# Patient Record
Sex: Female | Born: 1950 | Race: White | Hispanic: No | Smoking: Never smoker
Health system: Southern US, Community
[De-identification: ages and names within clinical notes are randomized; demographics above are authoritative.]

## PROBLEM LIST (undated history)

## (undated) DIAGNOSIS — I10 Essential (primary) hypertension: Secondary | ICD-10-CM

## (undated) DIAGNOSIS — T7840XA Allergy, unspecified, initial encounter: Secondary | ICD-10-CM

## (undated) DIAGNOSIS — E785 Hyperlipidemia, unspecified: Secondary | ICD-10-CM

## (undated) DIAGNOSIS — K219 Gastro-esophageal reflux disease without esophagitis: Secondary | ICD-10-CM

## (undated) DIAGNOSIS — E079 Disorder of thyroid, unspecified: Secondary | ICD-10-CM

## (undated) DIAGNOSIS — Z9889 Other specified postprocedural states: Secondary | ICD-10-CM

## (undated) DIAGNOSIS — R112 Nausea with vomiting, unspecified: Secondary | ICD-10-CM

## (undated) DIAGNOSIS — H269 Unspecified cataract: Secondary | ICD-10-CM

## (undated) HISTORY — PX: EYE SURGERY: SHX253

## (undated) HISTORY — DX: Gastro-esophageal reflux disease without esophagitis: K21.9

## (undated) HISTORY — DX: Allergy, unspecified, initial encounter: T78.40XA

## (undated) HISTORY — PX: LIGAMENT REPAIR: SHX5444

## (undated) HISTORY — DX: Hyperlipidemia, unspecified: E78.5

## (undated) HISTORY — PX: TUBAL LIGATION: SHX77

## (undated) HISTORY — DX: Unspecified cataract: H26.9

## (undated) HISTORY — DX: Essential (primary) hypertension: I10

## (undated) HISTORY — DX: Disorder of thyroid, unspecified: E07.9

## (undated) HISTORY — PX: FRACTURE SURGERY: SHX138

---

## 1997-10-29 ENCOUNTER — Other Ambulatory Visit: Admission: RE | Admit: 1997-10-29 | Discharge: 1997-10-29 | Payer: Self-pay | Admitting: Obstetrics and Gynecology

## 1998-08-13 ENCOUNTER — Other Ambulatory Visit: Admission: RE | Admit: 1998-08-13 | Discharge: 1998-08-13 | Payer: Self-pay | Admitting: Obstetrics and Gynecology

## 1999-10-22 ENCOUNTER — Other Ambulatory Visit: Admission: RE | Admit: 1999-10-22 | Discharge: 1999-10-22 | Payer: Self-pay | Admitting: Obstetrics and Gynecology

## 2000-10-22 ENCOUNTER — Other Ambulatory Visit: Admission: RE | Admit: 2000-10-22 | Discharge: 2000-10-22 | Payer: Self-pay | Admitting: Obstetrics and Gynecology

## 2001-08-10 HISTORY — PX: ESOPHAGOGASTRODUODENOSCOPY: SHX1529

## 2002-03-16 ENCOUNTER — Encounter: Admission: RE | Admit: 2002-03-16 | Discharge: 2002-03-16 | Payer: Self-pay | Admitting: Internal Medicine

## 2002-03-16 ENCOUNTER — Encounter: Payer: Self-pay | Admitting: Internal Medicine

## 2002-05-16 ENCOUNTER — Ambulatory Visit (HOSPITAL_COMMUNITY): Admission: RE | Admit: 2002-05-16 | Discharge: 2002-05-16 | Payer: Self-pay | Admitting: Gastroenterology

## 2002-06-21 ENCOUNTER — Encounter: Admission: RE | Admit: 2002-06-21 | Discharge: 2002-06-21 | Payer: Self-pay | Admitting: Internal Medicine

## 2002-06-21 ENCOUNTER — Encounter: Payer: Self-pay | Admitting: Internal Medicine

## 2002-07-07 ENCOUNTER — Emergency Department (HOSPITAL_COMMUNITY): Admission: EM | Admit: 2002-07-07 | Discharge: 2002-07-07 | Payer: Self-pay | Admitting: *Deleted

## 2002-07-12 ENCOUNTER — Encounter (HOSPITAL_COMMUNITY): Admission: RE | Admit: 2002-07-12 | Discharge: 2002-10-10 | Payer: Self-pay | Admitting: Internal Medicine

## 2002-07-13 ENCOUNTER — Encounter: Payer: Self-pay | Admitting: Internal Medicine

## 2002-08-10 HISTORY — PX: ESOPHAGEAL MANOMETRY: SHX1526

## 2002-08-10 HISTORY — PX: OTHER SURGICAL HISTORY: SHX169

## 2002-09-06 ENCOUNTER — Encounter: Admission: RE | Admit: 2002-09-06 | Discharge: 2002-09-06 | Payer: Self-pay | Admitting: Surgery

## 2002-09-06 ENCOUNTER — Encounter: Payer: Self-pay | Admitting: Surgery

## 2003-02-20 ENCOUNTER — Ambulatory Visit (HOSPITAL_COMMUNITY): Admission: RE | Admit: 2003-02-20 | Discharge: 2003-02-20 | Payer: Self-pay | Admitting: Gastroenterology

## 2003-02-21 ENCOUNTER — Encounter: Admission: RE | Admit: 2003-02-21 | Discharge: 2003-02-21 | Payer: Self-pay | Admitting: Endocrinology

## 2003-02-21 ENCOUNTER — Encounter: Payer: Self-pay | Admitting: Endocrinology

## 2003-03-05 ENCOUNTER — Encounter: Payer: Self-pay | Admitting: Orthopedic Surgery

## 2003-03-05 ENCOUNTER — Ambulatory Visit: Admission: RE | Admit: 2003-03-05 | Discharge: 2003-03-05 | Payer: Self-pay | Admitting: Orthopedic Surgery

## 2003-04-09 ENCOUNTER — Encounter (HOSPITAL_COMMUNITY): Admission: RE | Admit: 2003-04-09 | Discharge: 2003-05-09 | Payer: Self-pay | Admitting: Orthopedic Surgery

## 2003-09-04 ENCOUNTER — Encounter: Admission: RE | Admit: 2003-09-04 | Discharge: 2003-09-04 | Payer: Self-pay | Admitting: Endocrinology

## 2003-12-27 ENCOUNTER — Encounter: Admission: RE | Admit: 2003-12-27 | Discharge: 2003-12-27 | Payer: Self-pay | Admitting: Endocrinology

## 2004-03-25 ENCOUNTER — Other Ambulatory Visit: Admission: RE | Admit: 2004-03-25 | Discharge: 2004-03-25 | Payer: Self-pay | Admitting: Internal Medicine

## 2005-02-13 ENCOUNTER — Encounter: Admission: RE | Admit: 2005-02-13 | Discharge: 2005-02-13 | Payer: Self-pay | Admitting: Endocrinology

## 2005-06-02 ENCOUNTER — Other Ambulatory Visit: Admission: RE | Admit: 2005-06-02 | Discharge: 2005-06-02 | Payer: Self-pay | Admitting: Obstetrics and Gynecology

## 2006-08-18 ENCOUNTER — Encounter: Admission: RE | Admit: 2006-08-18 | Discharge: 2006-08-18 | Payer: Self-pay | Admitting: Endocrinology

## 2007-08-16 ENCOUNTER — Encounter: Admission: RE | Admit: 2007-08-16 | Discharge: 2007-08-16 | Payer: Self-pay | Admitting: Internal Medicine

## 2007-09-21 ENCOUNTER — Encounter: Admission: RE | Admit: 2007-09-21 | Discharge: 2007-09-21 | Payer: Self-pay | Admitting: Internal Medicine

## 2008-08-21 ENCOUNTER — Encounter: Admission: RE | Admit: 2008-08-21 | Discharge: 2008-08-21 | Payer: Self-pay | Admitting: Endocrinology

## 2010-08-31 ENCOUNTER — Encounter: Payer: Self-pay | Admitting: Endocrinology

## 2010-08-31 ENCOUNTER — Encounter: Payer: Self-pay | Admitting: Rheumatology

## 2010-08-31 ENCOUNTER — Encounter: Payer: Self-pay | Admitting: Internal Medicine

## 2010-12-26 NOTE — Op Note (Signed)
   NAME:  [name], KIDNEY NO.:  [id number]   MEDICAL RECORD NO.:  [id number]                   PATIENT TYPE:  AMB   LOCATION:  ENDO                                 FACILITY:  [facility]   PHYSICIAN:  Danise Edge, M.D.                DATE OF BIRTH:  03-Jan-1951   DATE OF PROCEDURE:  02/20/2003  DATE OF DISCHARGE:  02/20/2003                                 OPERATIVE REPORT   PROCEDURE:  Esophageal manometry, ambulatory 24 hour esophageal pH study.   PROCEDURE INDICATIONS:  Ms. Jill Roach is a 60 year old female born September 05, 1951.  Ms. Jill Roach takes Nexium twice daily for presumed gastroesophageal  reflux with regurgitation.  She took her last dose of Nexium February 12, 2003,  and is undergoing her procedure February 20, 2003.   ESOPHAGEAL MANOMETRY FINDINGS:  1. Lower esophageal sphincter data:  The resting lower esophageal sphincter     pressure is 33.4 mmHg which is normal.  There is 98% relaxation of the     lower esophageal sphincter for a duration of 17.4 seconds with swallow.  2. Esophageal body data:  Ms. Jill Roach performed ten wet swallows to obtain     esophageal body function data.  90% of her wet swallows were peristaltic     and 10% of her wet swallows resulted in retrograde contraction of the     esophagus.  Amplitude of the peristaltic contractions averages 47 mmHg     which is normal.  3. Upper esophageal sphincter function is coordinated.   IMPRESSION:  Normal esophageal manometry.   24 hour ambulatory esophageal pH study:  The composite Johnson/DeMeester  score analysis is 10.6 which is well within the normal range indicating  normal amount of gastroesophageal reflux.  Upright time in reflux measured  at 5.2% which is normal.  Recumbent time in reflux measured at 0.1% which is  normal.  Total time in reflux 3.3% which is normal.  Episodes of reflux  lasting over 5 minutes 0.  Longest episode of reflux 3.8 minutes which is  normal.  Total episodes  of reflux in 24 hours 60 with normal being less than  50.                                               Danise Edge, M.D.    MJ/MEDQ  D:  02/28/2003  T:  02/28/2003  Job:  [id number]

## 2010-12-26 NOTE — Op Note (Signed)
   NAME:  Jill Roach, Jill Roach NO.:  [id number]   MEDICAL RECORD NO.:  [id number]                   PATIENT TYPE:  AMB   LOCATION:  ENDO                                 FACILITY:  [facility]   PHYSICIAN:  Danise Edge, M.D.                DATE OF BIRTH:  Jan 31, 1951   DATE OF PROCEDURE:  05/16/2002  DATE OF DISCHARGE:                                 OPERATIVE REPORT   PROCEDURE PERFORMED:  Esophagogastroduodenoscopy.   ENDOSCOPIST:  Charolett Bumpers, M.D.   INDICATIONS FOR PROCEDURE:  The patient is a 60 year old  female born  09/21/1950.  The patient has a globus sensation in her hypopharynx.  Her barium tablet, liquid barium swallow and upper GI x-ray series was  normal.  Her ENT exam was normal.  She denies heartburn.  An empiric trial  of Nexium has not improved her globus sensation.   ENDOSCOPIST:  Charolett Bumpers, M.D.   PREMEDICATION:  Versed 10 mg, Fentanyl 50 mcg.   INSTRUMENT USED:  Olympus gastroscope.   DESCRIPTION OF PROCEDURE:  After obtaining informed consent, the patient was  placed in the left lateral decubitus position.  I administered intravenous  fentanyl and intravenous Versed to achieve conscious sedation for the  procedure.  The patient's blood pressure, oxygen saturations and cardiac  rhythm were monitored throughout the procedure and documented in the medical  record.   The Olympus gastroscope was passed through the posterior hypopharynx and the  proximal esophagus without difficulty.  The hypopharynx, larynx and vocal  cords appeared normal.   Esophagoscopy:  The proximal, mid and lower segments of the esophagus appear  normal.   Gastroscopy:  Retroflex view of the gastric cardia and fundus was normal.  The gastric body, antrum and pylorus appeared normal.   Duodenoscopy:  The duodenal bulb and descending duodenum appeared normal.   ASSESSMENT:  Normal esophagogastroduodenoscopy.                         Danise Edge, M.D.    MJ/MEDQ  D:  05/16/2002  T:  05/16/2002  Job:  [id number]   cc:   Lilla Shook, M.D.  [address], [address]  [place]  [place]  [address]  Fax: [phone]

## 2013-02-21 ENCOUNTER — Other Ambulatory Visit (HOSPITAL_COMMUNITY): Payer: Self-pay | Admitting: Physician Assistant

## 2013-02-21 DIAGNOSIS — Z139 Encounter for screening, unspecified: Secondary | ICD-10-CM

## 2013-02-27 ENCOUNTER — Ambulatory Visit (HOSPITAL_COMMUNITY)
Admission: RE | Admit: 2013-02-27 | Discharge: 2013-02-27 | Disposition: A | Discharge status: Home / Self Care | Payer: Self-pay | Source: Ambulatory Visit | Attending: Physician Assistant | Admitting: Physician Assistant

## 2013-02-27 DIAGNOSIS — Z139 Encounter for screening, unspecified: Secondary | ICD-10-CM

## 2014-03-06 ENCOUNTER — Other Ambulatory Visit (HOSPITAL_COMMUNITY): Payer: Self-pay | Admitting: Physician Assistant

## 2014-03-06 DIAGNOSIS — Z1231 Encounter for screening mammogram for malignant neoplasm of breast: Secondary | ICD-10-CM

## 2014-06-21 ENCOUNTER — Other Ambulatory Visit (HOSPITAL_COMMUNITY): Payer: Self-pay | Admitting: Physician Assistant

## 2014-06-21 DIAGNOSIS — Z1231 Encounter for screening mammogram for malignant neoplasm of breast: Secondary | ICD-10-CM

## 2014-06-25 ENCOUNTER — Ambulatory Visit (HOSPITAL_COMMUNITY)
Admission: RE | Admit: 2014-06-25 | Discharge: 2014-06-25 | Disposition: A | Discharge status: Home / Self Care | Payer: Self-pay | Source: Ambulatory Visit | Attending: Physician Assistant | Admitting: Physician Assistant

## 2014-06-25 DIAGNOSIS — Z1231 Encounter for screening mammogram for malignant neoplasm of breast: Secondary | ICD-10-CM

## 2015-06-05 ENCOUNTER — Ambulatory Visit: Payer: Self-pay | Admitting: Physician Assistant

## 2015-07-24 ENCOUNTER — Other Ambulatory Visit: Payer: Self-pay | Admitting: Physician Assistant

## 2015-09-20 ENCOUNTER — Encounter: Payer: Self-pay | Admitting: Family Medicine

## 2015-09-20 DIAGNOSIS — E785 Hyperlipidemia, unspecified: Secondary | ICD-10-CM | POA: Insufficient documentation

## 2015-09-20 DIAGNOSIS — I10 Essential (primary) hypertension: Secondary | ICD-10-CM | POA: Insufficient documentation

## 2015-09-20 DIAGNOSIS — E039 Hypothyroidism, unspecified: Secondary | ICD-10-CM | POA: Insufficient documentation

## 2015-09-24 ENCOUNTER — Ambulatory Visit: Payer: Self-pay | Admitting: Family Medicine

## 2015-09-24 ENCOUNTER — Telehealth: Payer: Self-pay | Admitting: Family Medicine

## 2015-09-24 NOTE — Telephone Encounter (Signed)
Left VM asking pt to return my call on cell home number rings once and then goes to a busy signal.

## 2015-09-24 NOTE — Telephone Encounter (Signed)
I can not legally fill any meds until she establishes here, see if she can come in tomorrow

## 2015-09-24 NOTE — Telephone Encounter (Signed)
Patient is coming in tomorrow at 3:00.

## 2015-09-24 NOTE — Telephone Encounter (Signed)
Patient showed up at 12:50 for a 1:15 appointment today. She misunderstood when we gave her the appointment for today at 12:15. She is rescheduled for 10/07/15 she is about out of her synthroid. I advised her I would send a message asking since she hasn't been seen I wasn't sure if we could fill the medication.

## 2015-09-25 ENCOUNTER — Ambulatory Visit (INDEPENDENT_AMBULATORY_CARE_PROVIDER_SITE_OTHER): Payer: PPO | Admitting: Family Medicine

## 2015-09-25 ENCOUNTER — Encounter: Payer: Self-pay | Admitting: Family Medicine

## 2015-09-25 VITALS — BP 132/74 | HR 72 | Temp 98.4°F | Resp 16 | Ht 63.0 in | Wt 175.0 lb

## 2015-09-25 DIAGNOSIS — Z0000 Encounter for general adult medical examination without abnormal findings: Secondary | ICD-10-CM

## 2015-09-25 DIAGNOSIS — Z23 Encounter for immunization: Secondary | ICD-10-CM

## 2015-09-25 DIAGNOSIS — Z13820 Encounter for screening for osteoporosis: Secondary | ICD-10-CM

## 2015-09-25 DIAGNOSIS — M1990 Unspecified osteoarthritis, unspecified site: Secondary | ICD-10-CM | POA: Diagnosis not present

## 2015-09-25 DIAGNOSIS — E038 Other specified hypothyroidism: Secondary | ICD-10-CM

## 2015-09-25 DIAGNOSIS — Z1239 Encounter for other screening for malignant neoplasm of breast: Secondary | ICD-10-CM | POA: Diagnosis not present

## 2015-09-25 DIAGNOSIS — I10 Essential (primary) hypertension: Secondary | ICD-10-CM | POA: Diagnosis not present

## 2015-09-25 DIAGNOSIS — E785 Hyperlipidemia, unspecified: Secondary | ICD-10-CM

## 2015-09-25 MED ORDER — LISINOPRIL-HYDROCHLOROTHIAZIDE 20-12.5 MG PO TABS: 1.0000 | ORAL_TABLET | Freq: Every day | ORAL | Status: DC

## 2015-09-25 MED ORDER — LEVOTHYROXINE SODIUM 88 MCG PO TABS: 88.0000 ug | ORAL_TABLET | Freq: Every day | ORAL | Status: DC

## 2015-09-25 MED ORDER — ZOSTER VACCINE LIVE 19400 UNT/0.65ML ~~LOC~~ SOLR: 0.6500 mL | Freq: Once | SUBCUTANEOUS | Status: DC

## 2015-09-25 NOTE — Patient Instructions (Signed)
Mammogram - call for appt Make appt for Bone Density  Release of records- [facility] in [place]  We will call with lab results  Prevnar 13 given  Colonoscopy referral  F/U 6 months

## 2015-09-25 NOTE — Assessment & Plan Note (Signed)
Continue current dose of thyroid medication. I will recheck levels today  She will need an ultrasound done in the near future

## 2015-09-25 NOTE — Assessment & Plan Note (Addendum)
Past nodules noted on her hands already. She has family history of rheumatoid. Check a rheumatoid factor  At next visit

## 2015-09-25 NOTE — Assessment & Plan Note (Signed)
Pressures well controlled she occasionally gets a dry cough but she does not want to change the medication it is not bothersome

## 2015-09-25 NOTE — Progress Notes (Signed)
Patient ID: Jill Roach, female   DOB: June 17, 1951, 65 y.o.   MRN: [id number] Subjective:   Patient presents for Medicare Annual/Subsequent preventive examination.  Pthere for welcome to Medicare examination also to establish care. She was being followed by the free clinic in [facility].  her past medical history was reviewed. She has history of goiter she also had a small nodule on her thyroid she's been on thyroid medication for the past 5 years or more. Her dose has been stable for many years.   She is history of hypertension diagnosed greater than 20 years ago she occasionally gets a hacking cough with her medicine but is not bothersome to her. She also has mild hyperlipidemia which she treats with diet and fish oil.    she was divorced 3 years ago she's been separated for 4 years. She is still going through legal proceedings over property and finances. She had to move in with her son for the past 2 years because her husband foreclosed on their home. She has never worked so this is been very difficult for her. She was treated with medication in the past she also went through divorce counseling she's not been on medications for greater than a year and feels that she is doing well.   she does have arthritis and gets some nodules on her fingers. She has a strong family history of rheumatoid arthritis. She does not take anything for this. Review Past Medical/Family/Social: Per EMR   Risk Factors  Current exercise habits: walks Dietary issues discussed: Yes   Cardiac risk factors: HTN    Depression Screen  (Note: if answer to either of the following is "Yes", a more complete depression screening is indicated)  Over the past two weeks, have you felt down, depressed or hopeless? No Over the past two weeks, have you felt little interest or pleasure in doing things? No Have you lost interest or pleasure in daily life? No Do you often feel hopeless? No Do you cry easily over  simple problems? No   Activities of Daily Living  In your present state of health, do you have any difficulty performing the following activities?:  Driving? No  Managing money? No  Feeding yourself? No  Getting from bed to chair? No  Climbing a flight of stairs? No  Preparing food and eating?: No  Bathing or showering? No  Getting dressed: No  Getting to the toilet? No  Using the toilet:No  Moving around from place to place: No  In the past year have you fallen or had a near fall?:No  Are you sexually active? No  Do you have more than one partner? No   Hearing Difficulties: No  Do you often ask people to speak up or repeat themselves? No  Do you experience ringing or noises in your ears? No Do you have difficulty understanding soft or whispered voices? No  Do you feel that you have a problem with memory? No Do you often misplace items? No  Do you feel safe at home? Yes  Cognitive Testing  Alert? Yes Normal Appearance?Yes  Oriented to person? Yes Place? Yes  Time? Yes  Recall of three objects? Yes  Can perform simple calculations? Yes  Displays appropriate judgment?Yes  Can read the correct time from a watch face?Yes   List the Names of Other Physician/Practitioners you currently use: None   ROS: GEN- denies fatigue, fever, weight loss,weakness, recent illness HEENT- denies eye drainage, change in vision, nasal discharge,  CVS- denies chest pain, palpitations RESP- denies SOB, cough, wheeze ABD- denies N/V, change in stools, abd pain GU- denies dysuria, hematuria, dribbling, incontinence MSK- denies joint pain, muscle aches, injury Neuro- denies headache, dizziness, syncope, seizure activity  Physical: GEN- NAD, alert and oriented x3 HEENT- PERRL, EOMI, non injected sclera, pink conjunctiva, MMM, oropharynx clear Neck- Supple, no thryomegaly, no bruit  CVS- RRR, no murmur RESP-CTAB ABD-NABS,soft,NT,ND Psych- normal affect and mood  EXT- No edema Pulses- Radial,  DP- 2+   Screening Tests / Date Colonoscopy        Overdue              Zostavax - Due Mammogram -Due  Influenza Vaccine UTD Tetanus/tdap UTD  Pneumonia- Due    Assessment:    Annual wellness medicare exam   Plan:    During the course of the visit the patient was educated and counseled about appropriate screening and preventive services including:  Screening mammography  Colorectal cancer screening  Shingles vaccine. Prescription given to that she can get the vaccine at the pharmacy or Medicare part D.  Screen NEG for depression.   pNEUMONIA VACCINE GIVEN  Diet review for nutrition referral? Yes ____ Not Indicated __x__  Patient Instructions (the written plan) was given to the patient.  Medicare Attestation  I have personally reviewed:  The patient's medical and social history  Their use of alcohol, tobacco or illicit drugs  Their current medications and supplements  The patient's functional ability including ADLs,fall risks, home safety risks, cognitive, and hearing and visual impairment  Diet and physical activities  Evidence for depression or mood disorders  The patient's weight, height, BMI, and visual acuity have been recorded in the chart. I have made referrals, counseling, and provided education to the patient based on review of the above and I have provided the patient with a written personalized care plan for preventive services.

## 2015-09-25 NOTE — Assessment & Plan Note (Signed)
I will obtain her fasting labs and records from her previous provider

## 2015-09-26 LAB — CBC WITH DIFFERENTIAL/PLATELET
Basophils Absolute: 0.1 10*3/uL (ref 0.0–0.1)
Basophils Relative: 1 % (ref 0–1)
Eosinophils Absolute: 0.1 10*3/uL (ref 0.0–0.7)
Eosinophils Relative: 1 % (ref 0–5)
HCT: 39.4 % (ref 36.0–46.0)
Hemoglobin: 13.2 g/dL (ref 12.0–15.0)
Lymphocytes Relative: 33 % (ref 12–46)
Lymphs Abs: 2.4 10*3/uL (ref 0.7–4.0)
MCH: 28 pg (ref 26.0–34.0)
MCHC: 33.5 g/dL (ref 30.0–36.0)
MCV: 83.7 fL (ref 78.0–100.0)
MPV: 9.9 fL (ref 8.6–12.4)
Monocytes Absolute: 0.5 10*3/uL (ref 0.1–1.0)
Monocytes Relative: 7 % (ref 3–12)
Neutro Abs: 4.2 10*3/uL (ref 1.7–7.7)
Neutrophils Relative %: 58 % (ref 43–77)
Platelets: 272 10*3/uL (ref 150–400)
RBC: 4.71 MIL/uL (ref 3.87–5.11)
RDW: 14.7 % (ref 11.5–15.5)
WBC: 7.2 10*3/uL (ref 4.0–10.5)

## 2015-09-26 LAB — COMPREHENSIVE METABOLIC PANEL
ALT: 13 U/L (ref 6–29)
AST: 14 U/L (ref 10–35)
Albumin: 4.3 g/dL (ref 3.6–5.1)
Alkaline Phosphatase: 67 U/L (ref 33–130)
BUN: 15 mg/dL (ref 7–25)
CO2: 25 mmol/L (ref 20–31)
Calcium: 9.1 mg/dL (ref 8.6–10.4)
Chloride: 104 mmol/L (ref 98–110)
Creat: 0.76 mg/dL (ref 0.50–0.99)
Glucose, Bld: 92 mg/dL (ref 70–99)
Potassium: 3.8 mmol/L (ref 3.5–5.3)
Sodium: 139 mmol/L (ref 135–146)
Total Bilirubin: 0.5 mg/dL (ref 0.2–1.2)
Total Protein: 6.9 g/dL (ref 6.1–8.1)

## 2015-09-26 LAB — TSH: TSH: 1.01 mIU/L

## 2015-09-26 LAB — T3, FREE: T3, Free: 2.8 pg/mL (ref 2.3–4.2)

## 2015-09-26 LAB — T4, FREE: Free T4: 1.2 ng/dL (ref 0.8–1.8)

## 2015-09-27 ENCOUNTER — Other Ambulatory Visit: Payer: Self-pay | Admitting: Family Medicine

## 2015-09-27 DIAGNOSIS — Z1211 Encounter for screening for malignant neoplasm of colon: Secondary | ICD-10-CM

## 2015-10-03 ENCOUNTER — Encounter (INDEPENDENT_AMBULATORY_CARE_PROVIDER_SITE_OTHER): Payer: Self-pay | Admitting: *Deleted

## 2015-10-07 ENCOUNTER — Ambulatory Visit: Payer: PPO | Admitting: Family Medicine

## 2015-10-15 ENCOUNTER — Encounter: Payer: Self-pay | Admitting: *Deleted

## 2015-11-06 ENCOUNTER — Encounter: Payer: Self-pay | Admitting: *Deleted

## 2015-11-13 ENCOUNTER — Ambulatory Visit (HOSPITAL_COMMUNITY)
Admission: RE | Admit: 2015-11-13 | Discharge: 2015-11-13 | Disposition: A | Discharge status: Home / Self Care | Payer: PPO | Source: Ambulatory Visit | Attending: Family Medicine | Admitting: Family Medicine

## 2015-11-13 DIAGNOSIS — Z780 Asymptomatic menopausal state: Secondary | ICD-10-CM | POA: Insufficient documentation

## 2015-11-13 DIAGNOSIS — Z13820 Encounter for screening for osteoporosis: Secondary | ICD-10-CM | POA: Insufficient documentation

## 2015-11-13 DIAGNOSIS — Z1231 Encounter for screening mammogram for malignant neoplasm of breast: Secondary | ICD-10-CM | POA: Insufficient documentation

## 2015-11-13 DIAGNOSIS — M85851 Other specified disorders of bone density and structure, right thigh: Secondary | ICD-10-CM | POA: Diagnosis not present

## 2015-11-13 DIAGNOSIS — Z1239 Encounter for other screening for malignant neoplasm of breast: Secondary | ICD-10-CM | POA: Insufficient documentation

## 2015-11-13 DIAGNOSIS — M8580 Other specified disorders of bone density and structure, unspecified site: Secondary | ICD-10-CM | POA: Diagnosis not present

## 2015-12-16 ENCOUNTER — Encounter: Payer: Self-pay | Admitting: Physician Assistant

## 2015-12-16 ENCOUNTER — Ambulatory Visit (INDEPENDENT_AMBULATORY_CARE_PROVIDER_SITE_OTHER): Payer: PPO | Admitting: Physician Assistant

## 2015-12-16 VITALS — BP 144/90 | HR 76 | Temp 97.8°F | Resp 18 | Wt 176.0 lb

## 2015-12-16 DIAGNOSIS — J988 Other specified respiratory disorders: Secondary | ICD-10-CM

## 2015-12-16 DIAGNOSIS — B9689 Other specified bacterial agents as the cause of diseases classified elsewhere: Secondary | ICD-10-CM

## 2015-12-16 MED ORDER — BENZONATATE 200 MG PO CAPS: 200.0000 mg | ORAL_CAPSULE | Freq: Two times a day (BID) | ORAL | Status: DC | PRN

## 2015-12-16 MED ORDER — AZITHROMYCIN 250 MG PO TABS: ORAL_TABLET | ORAL | Status: DC

## 2015-12-16 NOTE — Progress Notes (Signed)
    Patient ID: ZAYANA MANAGO MRN: [id number], DOB: 12/02/1950, 65 y.o. Date of Encounter: 12/16/2015, 10:31 AM    Chief Complaint:  Chief Complaint  Patient presents with  . sick x 1 week    thought allergies but not better, congestion, cough     HPI: 65 y.o. year old white female presents with above.   Has been taking Allegra-D and Mucinex. Unable to get much sleeps secondary to congestion and cough. Having increased cough. Feels a lot of draiange and phlegm in throat. When coughs it up, it is thick and dark. Not blowing much out of nose. "Has felt bd all week". No fever, chills. No sore throat.      Home Meds:   Outpatient Prescriptions Prior to Visit  Medication Sig Dispense Refill  . levothyroxine (SYNTHROID, LEVOTHROID) 88 MCG tablet Take 1 tablet (88 mcg total) by mouth daily. 90 tablet 2  . lisinopril-hydrochlorothiazide (PRINZIDE,ZESTORETIC) 20-12.5 MG tablet Take 1 tablet by mouth daily. 90 tablet 2  . Omega-3 Fatty Acids (FISH OIL) 1000 MG CAPS Take 1 capsule by mouth daily.    Marland Kitchen zoster vaccine live, PF, (ZOSTAVAX) 91478 UNT/0.65ML injection Inject 19,400 Units into the skin once. 1 each 0   No facility-administered medications prior to visit.    Allergies:  Allergies  Allergen Reactions  . Codeine Nausea Only      Review of Systems: See HPI for pertinent ROS. All other ROS negative.    Physical Exam: Blood pressure 144/90, pulse 76, temperature 97.8 F (36.6 C), temperature source Oral, resp. rate 18, weight 176 lb (79.833 kg)., Body mass index is 31.18 kg/(m^2). General:  WNWD WF. Appears in no acute distress. HEENT: Normocephalic, atraumatic, eyes without discharge, sclera non-icteric, nares are without discharge. Bilateral auditory canals clear, TM's are without perforation, pearly grey and translucent with reflective cone of light bilaterally. Oral cavity moist, posterior pharynx without exudate, erythema, peritonsillar abscess.No tenderness with  percussion of fronatl and maxillary sinuses.   Neck: Supple. No thyromegaly. No lymphadenopathy. Lungs: Clear bilaterally to auscultation without wheezes, rales, or rhonchi. Breathing is unlabored. Heart: Regular rhythm. No murmurs, rubs, or gallops. Msk:  Strength and tone normal for age. Extremities/Skin: Warm and dry.  Neuro: Alert and oriented X 3. Moves all extremities spontaneously. Gait is normal. CNII-XII grossly in tact. Psych:  Responds to questions appropriately with a normal affect.     ASSESSMENT AND PLAN:  65 y.o. year old female with  1. Bacterial respiratory infection Take abx as directed. Use Tessalon as needed for cough suppressant, esp at night so can get some sleep.  F/U if symptoms not resolved wihtin one week after compleiton of abx. - azithromycin (ZITHROMAX) 250 MG tablet; Day 1: Take 2 daily. Days 2-5: Take 1 daily.  Dispense: 6 tablet; Refill: 0 - benzonatate (TESSALON) 200 MG capsule; Take 1 capsule (200 mg total) by mouth 2 (two) times daily as needed for cough.  Dispense: 20 capsule; Refill: 0   Signed, [address] [place], [place], [facility] 12/16/2015 10:31 AM

## 2016-01-27 ENCOUNTER — Ambulatory Visit (INDEPENDENT_AMBULATORY_CARE_PROVIDER_SITE_OTHER): Payer: PPO | Admitting: Family Medicine

## 2016-01-27 ENCOUNTER — Encounter: Payer: Self-pay | Admitting: Family Medicine

## 2016-01-27 VITALS — BP 138/76 | HR 72 | Temp 98.7°F | Resp 16 | Ht 63.0 in | Wt 176.0 lb

## 2016-01-27 DIAGNOSIS — M533 Sacrococcygeal disorders, not elsewhere classified: Secondary | ICD-10-CM | POA: Diagnosis not present

## 2016-01-27 DIAGNOSIS — M545 Low back pain, unspecified: Secondary | ICD-10-CM

## 2016-01-27 MED ORDER — TRAMADOL HCL 50 MG PO TABS: 50.0000 mg | ORAL_TABLET | Freq: Three times a day (TID) | ORAL | Status: DC | PRN

## 2016-01-27 MED ORDER — METHYLPREDNISOLONE 4 MG PO TBPK: ORAL_TABLET | ORAL | Status: DC

## 2016-01-27 NOTE — Patient Instructions (Signed)
Try heat or ice to back Take medrol dosepak Ultram for pain  Call if not improved- Friday and Xray will be done F/U as previous

## 2016-01-27 NOTE — Progress Notes (Signed)
Patient ID: Jill Roach, female   DOB: [birth year], 65 y.o.   MRN: [id number]   Subjective:    Patient ID: Jill Roach, female    DOB: [birth year], 65 y.o.   MRN: [id number]  Patient presents for Lumbar Spine Pain Here with low back pain for the past week. She states that she was out pulling some lead to work in her garden when she began to have some low back pain. Most of his center near her tailbone but she did not have any anterior fall. She has a burning sensation on lower part of her back that goes into her buttocks but denies new radiation down the back of the leg to the foot. No change in bowel or bladder. She has discomfort when she tries to sit. She did take some Aleve but this has not helped. She states she has history of arthritis in her back and usually only flares up like this for a day or 2 and then it resolves but this has been persistent.    Review Of Systems:  GEN- denies fatigue, fever, weight loss,weakness, recent illness HEENT- denies eye drainage, change in vision, nasal discharge, CVS- denies chest pain, palpitations RESP- denies SOB, cough, wheeze ABD- denies N/V, change in stools, abd pain GU- denies dysuria, hematuria, dribbling, incontinence MSK- +joint pain, muscle aches, injury Neuro- denies headache, dizziness, syncope, seizure activity       Objective:    BP 138/76 mmHg  Pulse 72  Temp(Src) 98.7 F (37.1 C) (Oral)  Resp 16  Ht 5\' 3"  (1.6 m)  Wt 176 lb (79.833 kg)  BMI 31.18 kg/m2 GEN- NAD, alert and oriented x3 MSK- neg HIP rock, TTP lumbar spine and sacrum near tail bone, neg SLR, faiR ROM HIPS/SPINE, pain with flexion of spine Neuro- normal tone, motor equal bilat, sensation in tact  EXT- No edema Pulses- Radial  2+        Assessment & Plan:      Problem List Items Addressed This Visit    None    Visit Diagnoses    Sacral back pain    -  Primary    Lumosacral back pain, known Arthritis of spine, no red flags, hold on imaging,  overuse with gardening, given medrol dosepak, ultram, heat/ice alternate,x ray if not improved     Relevant Medications    naproxen sodium (ANAPROX) 220 MG tablet    methylPREDNISolone (MEDROL DOSEPAK) 4 MG TBPK tablet    traMADol (ULTRAM) 50 MG tablet    Midline low back pain without sciatica        Relevant Medications    naproxen sodium (ANAPROX) 220 MG tablet    methylPREDNISolone (MEDROL DOSEPAK) 4 MG TBPK tablet    traMADol (ULTRAM) 50 MG tablet       Note: This dictation was prepared with Dragon dictation along with smaller phrase technology. Any transcriptional errors that result from this process are unintentional.

## 2016-02-17 ENCOUNTER — Ambulatory Visit (INDEPENDENT_AMBULATORY_CARE_PROVIDER_SITE_OTHER): Payer: PPO | Admitting: Physician Assistant

## 2016-02-17 ENCOUNTER — Encounter: Payer: Self-pay | Admitting: Physician Assistant

## 2016-02-17 VITALS — BP 116/76 | HR 76 | Temp 98.1°F | Resp 18 | Wt 179.0 lb

## 2016-02-17 DIAGNOSIS — J209 Acute bronchitis, unspecified: Secondary | ICD-10-CM | POA: Diagnosis not present

## 2016-02-17 MED ORDER — AZITHROMYCIN 250 MG PO TABS: ORAL_TABLET | ORAL | Status: DC

## 2016-02-17 MED ORDER — PREDNISONE 20 MG PO TABS: 20.0000 mg | ORAL_TABLET | Freq: Every day | ORAL | Status: DC

## 2016-02-17 MED ORDER — ALBUTEROL SULFATE HFA 108 (90 BASE) MCG/ACT IN AERS: 2.0000 | INHALATION_SPRAY | Freq: Four times a day (QID) | RESPIRATORY_TRACT | Status: DC | PRN

## 2016-02-17 NOTE — Progress Notes (Signed)
Patient ID: Jill Roach MRN: [id number], DOB: Jan 13, 1951, 65 y.o. Date of Encounter: 02/17/2016, 11:33 AM    Chief Complaint:  Chief Complaint  Patient presents with  . congested up again    same as a month ago     HPI: 65 y.o. year old white female presents With above.  I reviewed her chart and reviewed my office visit note with her from 12/16/15. She states that those symptoms did resolve.  She states that these new symptoms started last week. Started with feeling drainage in her throat. Then developed scratchy throat. Then developed cough productive of thick yellow green phlegm. Minimal head/nasal congestion. No fevers or chills. No sore throat. No ear ache.     Home Meds:   Outpatient Prescriptions Prior to Visit  Medication Sig Dispense Refill  . levothyroxine (SYNTHROID, LEVOTHROID) 88 MCG tablet Take 1 tablet (88 mcg total) by mouth daily. 90 tablet 2  . lisinopril-hydrochlorothiazide (PRINZIDE,ZESTORETIC) 20-12.5 MG tablet Take 1 tablet by mouth daily. 90 tablet 2  . Omega-3 Fatty Acids (FISH OIL) 1000 MG CAPS Take 1 capsule by mouth daily.    . naproxen sodium (ANAPROX) 220 MG tablet Take 220 mg by mouth 2 (two) times daily with a meal.    . traMADol (ULTRAM) 50 MG tablet Take 1 tablet (50 mg total) by mouth every 8 (eight) hours as needed. (Patient not taking: Reported on 02/17/2016) 30 tablet 0  . methylPREDNISolone (MEDROL DOSEPAK) 4 MG TBPK tablet Take as directed on package 21 tablet 0   No facility-administered medications prior to visit.    Allergies:  Allergies  Allergen Reactions  . Codeine Nausea Only      Review of Systems: See HPI for pertinent ROS. All other ROS negative.    Physical Exam: Blood pressure 116/76, pulse 76, temperature 98.1 F (36.7 C), temperature source Oral, resp. rate 18, weight 179 lb (81.194 kg)., Body mass index is 31.72 kg/(m^2). General:  WNWD WF. Appears in no acute distress. HEENT: Normocephalic, atraumatic, eyes  without discharge, sclera non-icteric, nares are without discharge. Bilateral auditory canals clear, TM's are without perforation, pearly grey and translucent with reflective cone of light bilaterally. Oral cavity moist, posterior pharynx without exudate, erythema, peritonsillar abscess.  Neck: Supple. No thyromegaly. No lymphadenopathy. Lungs: Very slight wheeze scattered throughout bilaterally. Otherwise clear with good breath sounds and good air movement.  Heart: Regular rhythm. No murmurs, rubs, or gallops. Msk:  Strength and tone normal for age. Extremities/Skin: Warm and dry. Neuro: Alert and oriented X 3. Moves all extremities spontaneously. Gait is normal. CNII-XII grossly in tact. Psych:  Responds to questions appropriately with a normal affect.     ASSESSMENT AND PLAN:  65 y.o. year old female with  1. Acute bronchitis, unspecified organism I reviewed her chart and did not see any mention of COPD or asthma. I asked if she smokes and she says no. I asked if she has history of asthma and she says that in the past she was told that she had some wheezing and at that time her husband was smoking and " they told him if he did not stop smoking around her that he was she was going to have asthma". Says now her 2 sons smoke and she tries to tell them to stay away from her so that she is not exposed to the smoke. She also says that the only time she has that problem is if she is around cigarette smoke or starts getting  an infection. She is to take antibiotic as directed. She is to take the prednisone 20 mg daily for 5 days. She is to use the albuterol inhaler 3 times a day today and tomorrow and then decrease to PRN use. Follow up if symptoms worsen or do not resolve completely--- within 1 week after completion of medications. - azithromycin (ZITHROMAX) 250 MG tablet; Day 1: Take 2 daily. Days 2- 5: Takes 1 daily.  Dispense: 6 tablet; Refill: 0 - predniSONE (DELTASONE) 20 MG tablet; Take 1 tablet  (20 mg total) by mouth daily with breakfast.  Dispense: 5 tablet; Refill: 0 - albuterol (PROVENTIL HFA;VENTOLIN HFA) 108 (90 Base) MCG/ACT inhaler; Inhale 2 puffs into the lungs every 6 (six) hours as needed for wheezing or shortness of breath.  Dispense: 1 Inhaler; Refill: 0   Signed, [address] [place], [place], [facility] 02/17/2016 11:33 AM

## 2016-02-20 ENCOUNTER — Telehealth: Payer: Self-pay | Admitting: Family Medicine

## 2016-02-20 NOTE — Telephone Encounter (Signed)
Patient calling to say that she is not much better from her last visit  Please call and advise her what she should do  [phone]

## 2016-02-21 NOTE — Telephone Encounter (Signed)
I spoke with patient. Advised her to complete medications also to add Mucinex DM for the mucus she still getting a lot of congestion and still has the cough. Advised to do bronchitis cough can last for a couple weeks. She is still not significant for leg improved by next Wednesday she needs to have chest x-ray done.

## 2016-03-05 ENCOUNTER — Encounter: Payer: Self-pay | Admitting: Physician Assistant

## 2016-03-05 ENCOUNTER — Ambulatory Visit (INDEPENDENT_AMBULATORY_CARE_PROVIDER_SITE_OTHER): Payer: PPO | Admitting: Physician Assistant

## 2016-03-05 VITALS — BP 132/80 | HR 80 | Temp 98.2°F | Resp 18 | Wt 179.0 lb

## 2016-03-05 DIAGNOSIS — S91209A Unspecified open wound of unspecified toe(s) with damage to nail, initial encounter: Secondary | ICD-10-CM

## 2016-03-05 MED ORDER — CEPHALEXIN 500 MG PO CAPS: 500.0000 mg | ORAL_CAPSULE | Freq: Four times a day (QID) | ORAL | 0 refills | Status: DC

## 2016-03-05 NOTE — Progress Notes (Signed)
    Patient ID: Jill Roach MRN: [id number], DOB: 23-Oct-1950, 65 y.o. Date of Encounter: 03/05/2016, 4:43 PM    Chief Complaint:  Chief Complaint  Patient presents with  . Toe Injury    rt great toe, has had drainage, oder     HPI: 65 y.o. year old white female states that she banged her toe on something last week and the toenail became a little loose. Says that then on Monday 03/02/16 she banged it again and hit it on a box. At that time part of the toenail came off. The drainage she has seen has been clear but she has noticed some odor so she was concerned and came in. She does not have diabetes. She has no other complaints or concerns.     Home Meds:   Outpatient Medications Prior to Visit  Medication Sig Dispense Refill  . albuterol (PROVENTIL HFA;VENTOLIN HFA) 108 (90 Base) MCG/ACT inhaler Inhale 2 puffs into the lungs every 6 (six) hours as needed for wheezing or shortness of breath. 1 Inhaler 0  . levothyroxine (SYNTHROID, LEVOTHROID) 88 MCG tablet Take 1 tablet (88 mcg total) by mouth daily. 90 tablet 2  . lisinopril-hydrochlorothiazide (PRINZIDE,ZESTORETIC) 20-12.5 MG tablet Take 1 tablet by mouth daily. 90 tablet 2  . naproxen sodium (ANAPROX) 220 MG tablet Take 220 mg by mouth 2 (two) times daily with a meal.    . Omega-3 Fatty Acids (FISH OIL) 1000 MG CAPS Take 1 capsule by mouth daily.    . traMADol (ULTRAM) 50 MG tablet Take 1 tablet (50 mg total) by mouth every 8 (eight) hours as needed. (Patient not taking: Reported on 02/17/2016) 30 tablet 0  . azithromycin (ZITHROMAX) 250 MG tablet Day 1: Take 2 daily. Days 2- 5: Takes 1 daily. 6 tablet 0  . predniSONE (DELTASONE) 20 MG tablet Take 1 tablet (20 mg total) by mouth daily with breakfast. 5 tablet 0   No facility-administered medications prior to visit.     Allergies:  Allergies  Allergen Reactions  . Codeine Nausea Only      Review of Systems: See HPI for pertinent ROS. All other ROS negative.    Physical  Exam: Blood pressure 132/80, pulse 80, temperature 98.2 F (36.8 C), temperature source Oral, resp. rate 18, weight 179 lb (81.2 kg)., Body mass index is 31.71 kg/m. General:  WNWD WF. Appears in no acute distress. Neck: Supple. No thyromegaly. No lymphadenopathy. Lungs: Clear bilaterally to auscultation without wheezes, rales, or rhonchi. Breathing is unlabored. Heart: Regular rhythm. No murmurs, rubs, or gallops. Msk:  Strength and tone normal for age. Extremities/Skin:  Right 1st Toe:  Distal 1/2 of toenail is missing.  There is no purulent drainage.  There is light pink erythema at proximal edge of toenail -- < 1/2 cm--- No other erythema.  Neuro: Alert and oriented X 3. Moves all extremities spontaneously. Gait is normal. CNII-XII grossly in tact. Psych:  Responds to questions appropriately with a normal affect.     ASSESSMENT AND PLAN:  65 y.o. year old female with  1. Wound of toenail, initial encounter --Keflex 500mg  1 po QID x 7days--- She is to take the Keflex as directed. Start it immediately. Call us if site worsens or is not improving in several days. As well follow-up if redness and drainage not resolved in 1 week.   [address] [place], [place], [facility] 03/05/2016 4:43 PM

## 2016-03-24 ENCOUNTER — Encounter: Payer: Self-pay | Admitting: Family Medicine

## 2016-03-24 ENCOUNTER — Ambulatory Visit (INDEPENDENT_AMBULATORY_CARE_PROVIDER_SITE_OTHER): Payer: PPO | Admitting: Family Medicine

## 2016-03-24 VITALS — BP 136/80 | HR 74 | Temp 98.7°F | Resp 14 | Ht 63.0 in | Wt 178.0 lb

## 2016-03-24 DIAGNOSIS — E038 Other specified hypothyroidism: Secondary | ICD-10-CM | POA: Diagnosis not present

## 2016-03-24 DIAGNOSIS — M1990 Unspecified osteoarthritis, unspecified site: Secondary | ICD-10-CM | POA: Diagnosis not present

## 2016-03-24 DIAGNOSIS — F5104 Psychophysiologic insomnia: Secondary | ICD-10-CM | POA: Insufficient documentation

## 2016-03-24 DIAGNOSIS — G4700 Insomnia, unspecified: Secondary | ICD-10-CM | POA: Diagnosis not present

## 2016-03-24 DIAGNOSIS — I10 Essential (primary) hypertension: Secondary | ICD-10-CM

## 2016-03-24 DIAGNOSIS — E669 Obesity, unspecified: Secondary | ICD-10-CM | POA: Diagnosis not present

## 2016-03-24 MED ORDER — LEVOTHYROXINE SODIUM 88 MCG PO TABS: 88.0000 ug | ORAL_TABLET | Freq: Every day | ORAL | 2 refills | Status: DC

## 2016-03-24 MED ORDER — TRAZODONE HCL 50 MG PO TABS: 25.0000 mg | ORAL_TABLET | Freq: Every evening | ORAL | 6 refills | Status: DC | PRN

## 2016-03-24 MED ORDER — LISINOPRIL-HYDROCHLOROTHIAZIDE 20-12.5 MG PO TABS: 1.0000 | ORAL_TABLET | Freq: Every day | ORAL | 2 refills | Status: DC

## 2016-03-24 NOTE — Assessment & Plan Note (Signed)
Well controlled, no changes 

## 2016-03-24 NOTE — Assessment & Plan Note (Signed)
Check RF, CRP, did not get last visit Has family history of this She has ultram and aleve as needed

## 2016-03-24 NOTE — Assessment & Plan Note (Signed)
TFT at goal no changes  

## 2016-03-24 NOTE — Patient Instructions (Signed)
We will call with lab results Try trazodone for sleep, take 1/2 tablet 1 hour before bedtime  F/U 6 months for Physical
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
## 2016-03-25 LAB — RHEUMATOID FACTOR: Rheumatoid fact SerPl-aCnc: <10 [IU]/mL

## 2016-03-25 LAB — ANA: Anti Nuclear Antibody(ANA): NEGATIVE

## 2016-03-25 LAB — C-REACTIVE PROTEIN: CRP: <0.5 mg/dL

## 2016-03-27 ENCOUNTER — Telehealth: Payer: Self-pay | Admitting: *Deleted

## 2016-03-27 NOTE — Telephone Encounter (Signed)
Call placed to patient and patient made aware per VM.  

## 2016-03-27 NOTE — Telephone Encounter (Signed)
Patient thought routine labs were to be drawn every 6 months.   She wanted to have thyroid function and cholesterol levels monitored.

## 2016-03-27 NOTE — Telephone Encounter (Signed)
Patient called to get lab results.   Inquired as to why routine labs were not drawn.   MD please advise.

## 2016-03-27 NOTE — Telephone Encounter (Signed)
She can go get cholesterol drawn in [place] I did not do at last visit since she had recently had labs drawn from [place] see scanned into chart- so see if you can get another copy of her last set of labs  If pt are stable on thyroid medication I dont tend to check but once a year,  But we can check twice a year if she is worried   For cholesterol this is also only checked once a year unless she is on statin drug or fenofibrate

## 2016-03-27 NOTE — Telephone Encounter (Signed)
Please see what the concern is, she just had labs done in Feb, did not need anything except her arthritis labs which could not be added on in Feb

## 2016-03-30 NOTE — Telephone Encounter (Signed)
Call placed to [facility] [phone]~telephone.   Was advised that patient has not been seen at clinic in a "long time".  Will fax last set of labs.

## 2016-06-16 DIAGNOSIS — L821 Other seborrheic keratosis: Secondary | ICD-10-CM | POA: Diagnosis not present

## 2016-06-16 DIAGNOSIS — D225 Melanocytic nevi of trunk: Secondary | ICD-10-CM | POA: Diagnosis not present

## 2016-06-16 DIAGNOSIS — B078 Other viral warts: Secondary | ICD-10-CM | POA: Diagnosis not present

## 2016-06-16 DIAGNOSIS — L918 Other hypertrophic disorders of the skin: Secondary | ICD-10-CM | POA: Diagnosis not present

## 2016-08-07 ENCOUNTER — Telehealth: Payer: Self-pay

## 2016-08-07 NOTE — Telephone Encounter (Signed)
Patient states her pharmacy requested she contact PCP to get permission to switch thyroid med from brand to generic, due to the loss of manufacturer in [place],  brand unavailable. Confirmed w/ Dr. Dorian Heckle nurse ok to switch to generic. Informed pharmacist.

## 2016-08-11 ENCOUNTER — Telehealth: Payer: Self-pay | Admitting: Family Medicine

## 2016-08-11 NOTE — Telephone Encounter (Signed)
Patient is calling to speak to you regarding an illness she is having, she had the same thing last year, advised to go to urgent care, would not do this.  [id number]

## 2016-08-11 NOTE — Telephone Encounter (Signed)
Call placed to patient.   Reports that she is congested and has HA, productive cough, and slight sore throat 1 day.   Advised to use OTC Mucinex or Robitussin for cough and nasal saline for congestion. Advised to increase rest and to increase fluid intake. Recommended that if symptoms worsen or persist >1 week to contact office for visit.

## 2016-08-12 ENCOUNTER — Encounter: Payer: Self-pay | Admitting: Physician Assistant

## 2016-08-12 ENCOUNTER — Ambulatory Visit (INDEPENDENT_AMBULATORY_CARE_PROVIDER_SITE_OTHER): Payer: PPO | Admitting: Physician Assistant

## 2016-08-12 VITALS — BP 138/72 | HR 82 | Temp 98.1°F | Resp 16 | Ht 63.0 in | Wt 171.0 lb

## 2016-08-12 DIAGNOSIS — B9689 Other specified bacterial agents as the cause of diseases classified elsewhere: Secondary | ICD-10-CM

## 2016-08-12 DIAGNOSIS — J209 Acute bronchitis, unspecified: Secondary | ICD-10-CM

## 2016-08-12 DIAGNOSIS — J988 Other specified respiratory disorders: Secondary | ICD-10-CM | POA: Diagnosis not present

## 2016-08-12 MED ORDER — AZITHROMYCIN 250 MG PO TABS: ORAL_TABLET | ORAL | 0 refills | Status: DC

## 2016-08-12 MED ORDER — PREDNISONE 20 MG PO TABS: 20.0000 mg | ORAL_TABLET | Freq: Every day | ORAL | 0 refills | Status: DC

## 2016-08-12 MED ORDER — ALBUTEROL SULFATE HFA 108 (90 BASE) MCG/ACT IN AERS: 2.0000 | INHALATION_SPRAY | Freq: Four times a day (QID) | RESPIRATORY_TRACT | 0 refills | Status: DC | PRN
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
## 2016-08-24 ENCOUNTER — Encounter: Payer: Self-pay | Admitting: Family Medicine

## 2016-08-24 ENCOUNTER — Ambulatory Visit (INDEPENDENT_AMBULATORY_CARE_PROVIDER_SITE_OTHER): Payer: PPO | Admitting: Family Medicine

## 2016-08-24 VITALS — BP 140/72 | HR 84 | Temp 98.1°F | Resp 14 | Ht 63.0 in | Wt 181.0 lb

## 2016-08-24 DIAGNOSIS — E038 Other specified hypothyroidism: Secondary | ICD-10-CM

## 2016-08-24 DIAGNOSIS — E042 Nontoxic multinodular goiter: Secondary | ICD-10-CM | POA: Diagnosis not present

## 2016-08-24 NOTE — Patient Instructions (Addendum)
We will set you up for ultrasound of thryoid We will call with lab results F/U as previous

## 2016-08-24 NOTE — Progress Notes (Signed)
   Subjective:    Patient ID: Jill Roach, female    DOB: 08-Aug-1951, 66 y.o.   MRN: [id number]  Patient presents for Froeign Body Sensation in Throat (states that she feels like something is in throat- reports that it feels like it did when she had goiter issues- goiter was treated with meds)  Patient here with sensation in her throat. States it feels like previously when her thyroid was enlarged and she had multiple nodules. This is been going on for the past couple months. She is taking her thyroid medicine as described here to review of her previous records her last ultrasound was back in 2010 at that time she had a smaller multinodular goiter. She denies any choking on food but feels like there is something stuck there. This note did start before she came in with the congestion and upper respiratory which is now resolved. She states in the past she had EGD there was nothing gastrointestinal wise it was all secondary to her thyroid  She denies any abnormal fatigue or other thyroid symptoms  Review Of Systems:  GEN- denies fatigue, fever, weight loss,weakness, recent illness HEENT- denies eye drainage, change in vision, nasal discharge, CVS- denies chest pain, palpitations RESP- denies SOB, cough, wheeze ABD- denies N/V, change in stools, abd pain GU- denies dysuria, hematuria, dribbling, incontinence MSK- denies joint pain, muscle aches, injury Neuro- denies headache, dizziness, syncope, seizure activity       Objective:    BP 140/72 (BP Location: Left Arm, Patient Position: Sitting, Cuff Size: Normal)   Pulse 84   Temp 98.1 F (36.7 C) (Oral)   Resp 14   Ht 5\' 3"  (1.6 m)   Wt 181 lb (82.1 kg)   SpO2 98%   BMI 32.06 kg/m  GEN- NAD, alert and oriented x3 HEENT- PERRL, EOMI, non injected sclera, pink conjunctiva, MMM, oropharynx clear Neck- Supple, mild  Thyromegaly, ? Small nodule right side palpated  CVS- RRR, no murmur RESP-CTAB EXT- No edema Pulses- Radial   2+        Assessment & Plan:      Problem List Items Addressed This Visit    Hypothyroidism - Primary    I'll recheck her thyroid function studies will also obtain ultrasound of her thyroid to evaluate her goiter and previous not bills. The nodules have increased in size she may need biopsy done. If this looks normal and there is no change with her thyroid and we may need to re-address any type of gastrointestinal issue causing the sensation in her throat.      Relevant Orders   TSH (Completed)   T3, free (Completed)   T4, free (Completed)   Basic metabolic panel (Completed)   US Soft Tissue Head/Neck    Other Visit Diagnoses    Multinodular goiter       Relevant Orders   US Soft Tissue Head/Neck      Note: This dictation was prepared with Dragon dictation along with smaller phrase technology. Any transcriptional errors that result from this process are unintentional.

## 2016-08-25 ENCOUNTER — Encounter: Payer: Self-pay | Admitting: Family Medicine

## 2016-08-25 LAB — TSH: TSH: 0.76 mIU/L

## 2016-08-25 LAB — BASIC METABOLIC PANEL
BUN: 15 mg/dL (ref 7–25)
CO2: 27 mmol/L (ref 20–31)
Calcium: 9.3 mg/dL (ref 8.6–10.4)
Chloride: 104 mmol/L (ref 98–110)
Creat: 0.72 mg/dL (ref 0.50–0.99)
Glucose, Bld: 94 mg/dL (ref 70–99)
Potassium: 4 mmol/L (ref 3.5–5.3)
Sodium: 139 mmol/L (ref 135–146)

## 2016-08-25 LAB — T4, FREE: Free T4: 1.3 ng/dL (ref 0.8–1.8)

## 2016-08-25 LAB — T3, FREE: T3, Free: 3.2 pg/mL (ref 2.3–4.2)

## 2016-08-25 NOTE — Assessment & Plan Note (Signed)
I'll recheck her thyroid function studies will also obtain ultrasound of her thyroid to evaluate her goiter and previous not bills. The nodules have increased in size she may need biopsy done. If this looks normal and there is no change with her thyroid and we may need to re-address any type of gastrointestinal issue causing the sensation in her throat.

## 2016-09-03 ENCOUNTER — Encounter: Payer: Self-pay | Admitting: Physician Assistant

## 2016-09-03 ENCOUNTER — Ambulatory Visit (INDEPENDENT_AMBULATORY_CARE_PROVIDER_SITE_OTHER): Payer: PPO | Admitting: Physician Assistant

## 2016-09-03 VITALS — BP 140/84 | HR 83 | Temp 98.0°F | Resp 16 | Wt 181.6 lb

## 2016-09-03 DIAGNOSIS — L821 Other seborrheic keratosis: Secondary | ICD-10-CM

## 2016-09-03 NOTE — Progress Notes (Signed)
    Patient ID: BUFORD CATER MRN: [id number], DOB: 1951/02/03, 66 y.o. Date of Encounter: 09/03/2016, 4:41 PM    Chief Complaint:  Chief Complaint  Patient presents with  . spot  on upper left back     HPI: 66 y.o. year old female presents with above.   Says that 2 days ago she felt an itchy area on her back and was scratching it and then felt something back there. She then had her Aunt look at it. Aunt told her what it looked like and told her that she thought it could be ringworm or something so they decided she needed to come here and have it evaluated.     Home Meds:   Outpatient Medications Prior to Visit  Medication Sig Dispense Refill  . albuterol (PROVENTIL HFA;VENTOLIN HFA) 108 (90 Base) MCG/ACT inhaler Inhale 2 puffs into the lungs every 6 (six) hours as needed for wheezing or shortness of breath. 1 Inhaler 0  . levothyroxine (SYNTHROID, LEVOTHROID) 88 MCG tablet Take 1 tablet (88 mcg total) by mouth daily. 90 tablet 2  . lisinopril-hydrochlorothiazide (PRINZIDE,ZESTORETIC) 20-12.5 MG tablet Take 1 tablet by mouth daily. 90 tablet 2  . naproxen sodium (ANAPROX) 220 MG tablet Take 220 mg by mouth 2 (two) times daily with a meal.    . Omega-3 Fatty Acids (FISH OIL) 1000 MG CAPS Take 1 capsule by mouth daily.    . traMADol (ULTRAM) 50 MG tablet Take 1 tablet (50 mg total) by mouth every 8 (eight) hours as needed. 30 tablet 0  . traZODone (DESYREL) 50 MG tablet Take 0.5-1 tablets (25-50 mg total) by mouth at bedtime as needed for sleep. 30 tablet 6   No facility-administered medications prior to visit.     Allergies:  Allergies  Allergen Reactions  . Codeine Nausea Only      Review of Systems: See HPI for pertinent ROS. All other ROS negative.    Physical Exam: Blood pressure 140/84, pulse 83, temperature 98 F (36.7 C), temperature source Oral, resp. rate 16, weight 181 lb 9.6 oz (82.4 kg), SpO2 98 %., Body mass index is 32.17 kg/m. General:  WNWD WF Appears  in no acute distress. Neck: Supple. No thyromegaly. No lymphadenopathy. Lungs: Clear bilaterally to auscultation without wheezes, rales, or rhonchi. Breathing is unlabored. Heart: Regular rhythm. No murmurs, rubs, or gallops. Msk:  Strength and tone normal for age. Skin: Right Back at ~ T7 level: there is ~ 1 cm diameter plaque that has "stuck on" appearance---color is slightly deeper color than surrounding skin Neuro: Alert and oriented X 3. Moves all extremities spontaneously. Gait is normal. CNII-XII grossly in tact. Psych:  Responds to questions appropriately with a normal affect.     ASSESSMENT AND PLAN:  66 y.o. year old female with  1. Seborrheic keratosis Reassured that her that this is a benign lesion that comes with age so she may develop more as the years go on. No treatment indicated.   [address] [place], [place], [facility] 09/03/2016 4:41 PM

## 2016-09-09 ENCOUNTER — Ambulatory Visit (HOSPITAL_COMMUNITY)
Admission: RE | Admit: 2016-09-09 | Discharge: 2016-09-09 | Disposition: A | Discharge status: Home / Self Care | Payer: PPO | Source: Ambulatory Visit | Attending: Family Medicine | Admitting: Family Medicine

## 2016-09-09 DIAGNOSIS — E049 Nontoxic goiter, unspecified: Secondary | ICD-10-CM | POA: Diagnosis not present

## 2016-09-09 DIAGNOSIS — E042 Nontoxic multinodular goiter: Secondary | ICD-10-CM | POA: Insufficient documentation

## 2016-09-09 DIAGNOSIS — E038 Other specified hypothyroidism: Secondary | ICD-10-CM | POA: Diagnosis not present

## 2016-09-11 ENCOUNTER — Other Ambulatory Visit: Payer: Self-pay | Admitting: *Deleted

## 2016-09-11 DIAGNOSIS — R1310 Dysphagia, unspecified: Secondary | ICD-10-CM

## 2016-09-14 ENCOUNTER — Encounter: Payer: Self-pay | Admitting: Gastroenterology

## 2016-09-28 ENCOUNTER — Encounter: Payer: Self-pay | Admitting: Family Medicine

## 2016-09-28 ENCOUNTER — Ambulatory Visit (INDEPENDENT_AMBULATORY_CARE_PROVIDER_SITE_OTHER): Payer: PPO | Admitting: Family Medicine

## 2016-09-28 VITALS — BP 128/72 | HR 66 | Temp 98.2°F | Resp 14 | Ht 63.0 in | Wt 181.0 lb

## 2016-09-28 DIAGNOSIS — Z0000 Encounter for general adult medical examination without abnormal findings: Secondary | ICD-10-CM | POA: Diagnosis not present

## 2016-09-28 DIAGNOSIS — E782 Mixed hyperlipidemia: Secondary | ICD-10-CM | POA: Diagnosis not present

## 2016-09-28 DIAGNOSIS — M8589 Other specified disorders of bone density and structure, multiple sites: Secondary | ICD-10-CM

## 2016-09-28 DIAGNOSIS — Z23 Encounter for immunization: Secondary | ICD-10-CM | POA: Diagnosis not present

## 2016-09-28 DIAGNOSIS — Z6832 Body mass index (BMI) 32.0-32.9, adult: Secondary | ICD-10-CM | POA: Diagnosis not present

## 2016-09-28 DIAGNOSIS — Z1159 Encounter for screening for other viral diseases: Secondary | ICD-10-CM

## 2016-09-28 DIAGNOSIS — I10 Essential (primary) hypertension: Secondary | ICD-10-CM | POA: Diagnosis not present

## 2016-09-28 DIAGNOSIS — E6609 Other obesity due to excess calories: Secondary | ICD-10-CM

## 2016-09-28 DIAGNOSIS — M8580 Other specified disorders of bone density and structure, unspecified site: Secondary | ICD-10-CM | POA: Insufficient documentation

## 2016-09-28 DIAGNOSIS — F5104 Psychophysiologic insomnia: Secondary | ICD-10-CM | POA: Diagnosis not present

## 2016-09-28 LAB — COMPREHENSIVE METABOLIC PANEL
ALT: 15 U/L (ref 6–29)
AST: 14 U/L (ref 10–35)
Albumin: 4.4 g/dL (ref 3.6–5.1)
Alkaline Phosphatase: 71 U/L (ref 33–130)
BUN: 15 mg/dL (ref 7–25)
CO2: 25 mmol/L (ref 20–31)
Calcium: 9.5 mg/dL (ref 8.6–10.4)
Chloride: 107 mmol/L (ref 98–110)
Creat: 0.78 mg/dL (ref 0.50–0.99)
Glucose, Bld: 101 mg/dL — ABNORMAL HIGH (ref 70–99)
Potassium: 5 mmol/L (ref 3.5–5.3)
Sodium: 141 mmol/L (ref 135–146)
Total Bilirubin: 0.5 mg/dL (ref 0.2–1.2)
Total Protein: 7 g/dL (ref 6.1–8.1)

## 2016-09-28 LAB — CBC WITH DIFFERENTIAL/PLATELET
Basophils Absolute: 0 cells/uL (ref 0–200)
Basophils Relative: 0 %
Eosinophils Absolute: 54 cells/uL (ref 15–500)
Eosinophils Relative: 1 %
HCT: 40.8 % (ref 35.0–45.0)
Hemoglobin: 13.7 g/dL (ref 12.0–15.0)
Lymphocytes Relative: 31 %
Lymphs Abs: 1674 cells/uL (ref 850–3900)
MCH: 28.8 pg (ref 27.0–33.0)
MCHC: 33.6 g/dL (ref 32.0–36.0)
MCV: 85.9 fL (ref 80.0–100.0)
MPV: 9.7 fL (ref 7.5–12.5)
Monocytes Absolute: 432 cells/uL (ref 200–950)
Monocytes Relative: 8 %
Neutro Abs: 3240 cells/uL (ref 1500–7800)
Neutrophils Relative %: 60 %
Platelets: 241 10*3/uL (ref 140–400)
RBC: 4.75 MIL/uL (ref 3.80–5.10)
RDW: 14.6 % (ref 11.0–15.0)
WBC: 5.4 10*3/uL (ref 3.8–10.8)

## 2016-09-28 LAB — LIPID PANEL
Cholesterol: 130 mg/dL (ref <200)
HDL: 41 mg/dL — ABNORMAL LOW (ref >50)
LDL Cholesterol: 61 mg/dL (ref <100)
Total CHOL/HDL Ratio: 3.2 Ratio (ref <5.0)
Triglycerides: 138 mg/dL (ref <150)
VLDL: 28 mg/dL (ref <30)

## 2016-09-28 MED ORDER — TRAMADOL HCL 50 MG PO TABS: 50.0000 mg | ORAL_TABLET | Freq: Three times a day (TID) | ORAL | 0 refills | Status: DC | PRN

## 2016-09-28 MED ORDER — TETANUS-DIPHTH-ACELL PERTUSSIS 5-2-15.5 LF-MCG/0.5 IM SUSP: 0.5000 mL | Freq: Once | INTRAMUSCULAR | 0 refills | Status: AC

## 2016-09-28 NOTE — Patient Instructions (Addendum)
F/U 6 months  Keep appointment with GI, need Colonoscopy We will call with lab results Vitamin D 1000IU  Tetanus Booster Use aleve twice a day with food  Ultram is for pain Pneumonia vaccine 23 given today

## 2016-09-28 NOTE — Assessment & Plan Note (Signed)
Continue calcium vitamin-D

## 2016-09-28 NOTE — Progress Notes (Signed)
Subjective:   Patient presents for Medicare Annual/Subsequent preventive examination.     Had fall many years, gets burning sensation at her tailbone which is where she cracked it years ago. She has been taking ibuprofen which helps. She did not know that she can use the tramadol. It flares up randomly. Denies any new injury.    She has an appointment and see gastroenterology next week for the dysphagia-like symptoms she states that this is getting better she was just concerned that it was her thyroid which ultrasound did not show any significant goiter. She is due for colonoscopy therefore was to keep the appointment  On review of medication she has trazodone but rarely uses this for sleep Review Past Medical/Family/Social: Per EMR    Risk Factors  Current exercise habits: walks Dietary issues discussed: Yes  Cardiac risk factors: Obesity (BMI >= 30 kg/m2).  HTN   Depression Screen  (Note: if answer to either of the following is "Yes", a more complete depression screening is indicated)  Over the past two weeks, have you felt down, depressed or hopeless? No Over the past two weeks, have you felt little interest or pleasure in doing things? No Have you lost interest or pleasure in daily life? No Do you often feel hopeless? No Do you cry easily over simple problems? No   Activities of Daily Living  In your present state of health, do you have any difficulty performing the following activities?:  Driving? No  Managing money? No  Feeding yourself? No  Getting from bed to chair? No  Climbing a flight of stairs? No  Preparing food and eating?: No  Bathing or showering? No  Getting dressed: No  Getting to the toilet? No  Using the toilet:No  Moving around from place to place: No  In the past year have you fallen or had a near fall?:yes  Are you sexually active? No  Do you have more than one partner? No   Hearing Difficulties: No  Do you often ask people to speak up or repeat  themselves? No  Do you experience ringing or noises in your ears? No Do you have difficulty understanding soft or whispered voices? Sometimes   Do you feel that you have a problem with memory? No Do you often misplace items? No  Do you feel safe at home? Yes  Cognitive Testing  Alert? Yes Normal Appearance?Yes  Oriented to person? Yes Place? Yes  Time? Yes  Recall of three objects? Yes  Can perform simple calculations? Yes  Displays appropriate judgment?Yes  Can read the correct time from a watch face?Yes   List the Names of Other Physician/Practitioners you currently use:  Referred to GI recently   Screening Tests / Date Colonoscopy        Due              Zostavax  UTD Mammogram UTD Influenza Vaccine - UTD Tetanus/tdap - not covered Pneumonia- due for Pneumovax 23  ROS: GEN- denies fatigue, fever, weight loss,weakness, recent illness HEENT- denies eye drainage, change in vision, nasal discharge, CVS- denies chest pain, palpitations RESP- denies SOB, cough, wheeze ABD- denies N/V, change in stools, abd pain GU- denies dysuria, hematuria, dribbling, incontinence MSK- +joint pain, muscle aches, injury Neuro- denies headache, dizziness, syncope, seizure activity   Physical: GEN- NAD, alert and oriented x3 HEENT- PERRL, EOMI, non injected sclera, pink conjunctiva, MMM, oropharynx clear Neck- Supple, no bruit  CVS- RRR, no murmur RESP-CTAB ABD-NABS,soft,NT,ND  MSK- Lumbar spine  NT, mild TTP over tailbone, neg SLR, Fair ROM spine Neuro- motor equal bilat LE, sensation in tact, normal tone LE , normal gait  EXT- No edema Pulses- Radial, DP- 2+    Assessment:    Annual wellness medicare exam   Plan:    During the course of the visit the patient was educated and counseled about appropriate screening and preventive services including:  Pneumonia vaccine 23 given  Colorectal cancer screening   Tetanus booster will be sent to her pharmacy  Discussed HEP C  screening   Screen negative for depression. 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 No red flags on exam at the tailbone she can use Aleve temporarily add given her samples from office I also refilled her tramadol  Diet review for nutrition referral? Yes ____ Not Indicated __x__  Patient Instructions (the written plan) was given to the patient.  Medicare Attestation  I have personally reviewed:  The patient's medical and social history  Their use of alcohol, tobacco or illicit drugs  Their current medications and supplements  The patient's functional ability including ADLs,fall risks, home safety risks, cognitive, and hearing and visual impairment  Diet and physical activities  Evidence for depression or mood disorders  The patient's weight, height, BMI, and visual acuity have been recorded in the chart. I have made referrals, counseling, and provided education to the patient based on review of the above and I have provided the patient with a written personalized care plan for preventive services.

## 2016-09-28 NOTE — Assessment & Plan Note (Signed)
trazadone as needed

## 2016-09-28 NOTE — Assessment & Plan Note (Signed)
Controlled no changes Check lipids Work on dietary changes for weight

## 2016-09-29 LAB — HEPATITIS C ANTIBODY: HCV Ab: NEGATIVE

## 2016-10-05 ENCOUNTER — Other Ambulatory Visit: Payer: Self-pay

## 2016-10-05 ENCOUNTER — Encounter: Payer: Self-pay | Admitting: Gastroenterology

## 2016-10-05 ENCOUNTER — Ambulatory Visit (INDEPENDENT_AMBULATORY_CARE_PROVIDER_SITE_OTHER): Payer: PPO | Admitting: Gastroenterology

## 2016-10-05 ENCOUNTER — Telehealth: Payer: Self-pay

## 2016-10-05 DIAGNOSIS — F458 Other somatoform disorders: Secondary | ICD-10-CM | POA: Diagnosis not present

## 2016-10-05 DIAGNOSIS — R0989 Other specified symptoms and signs involving the circulatory and respiratory systems: Secondary | ICD-10-CM

## 2016-10-05 DIAGNOSIS — Z1211 Encounter for screening for malignant neoplasm of colon: Secondary | ICD-10-CM

## 2016-10-05 DIAGNOSIS — R198 Other specified symptoms and signs involving the digestive system and abdomen: Secondary | ICD-10-CM | POA: Insufficient documentation

## 2016-10-05 MED ORDER — PEG 3350-KCL-NA BICARB-NACL 420 G PO SOLR: 4000.0000 mL | ORAL | 0 refills | Status: DC

## 2016-10-05 MED ORDER — NA SULFATE-K SULFATE-MG SULF 17.5-3.13-1.6 GM/177ML PO SOLN: 1.0000 | ORAL | 0 refills | Status: DC

## 2016-10-05 NOTE — Patient Instructions (Signed)
1. Please have your x-ray of the esophagus done as scheduled. We will contact you with results as available. 2. Colonoscopy is scheduled. See separate instructions.

## 2016-10-05 NOTE — Assessment & Plan Note (Signed)
66 year old female with history of globus sensation recurrent over the past several months. Similar symptoms back in 2003/2004 with extensive evaluation as previously outlined above. Ultimately determined to be related to multinodular thyroid goiter. Symptoms resolved at that time after treated with Synthroid. Recent recurrent symptoms. No difficulty swallowing foods or pills. No significant heartburn. Thyroid ultrasound recently with minimal change in size of right and left lobes. Doubt we are dealing with esophageal issue but would offer her barium pill esophagram to look for Zenker's diverticulum or cervical esophageal stricture. We did discuss possibility of upper endoscopy but ultimately decided to go with x-ray initially.

## 2016-10-05 NOTE — Progress Notes (Signed)
cc'ed to pcp °

## 2016-10-05 NOTE — Progress Notes (Signed)
Primary Care Physician:  [place], KAWANTA, MD  Primary Gastroenterologist:  Michael Rourk, MD (per patient request)  Chief Complaint  Patient presents with  . Dysphagia    feels like something stuck in throat, had US of neck 09/09/16, no trouble swallowing food/pills    HPI:  Jill Roach is a 66 y.o. female here at the request of Dr. New Alluwe for further evaluation of globus sensation, need for her first ever screening colonoscopy.  Patient states she's had similar symptoms of globus around 2003. Took over a year for them to figure out what was going on. Finally determined that she had multinodular goiter to explain symptoms. Symptoms resolved after started synthroid. She saw endocrinology at the time, was evaluated by ENT. Dr. Matthew Martin performed EGD which was normal as well as a esophageal manometry with ambulatory 24-hour pH probe study both unremarkable.  She really thought her symptoms were due to her thyroid again. Dr. Belzoni ordered another ultrasound of her thyroid in January which showed Markedly heterogeneous and hypervascular thyroid parenchyma, right lobe measuring 3.7 x 1.7 x 1.6 cm, left lobe 4.2 x 1.2 X 1.7 cm (minimal change from 2010), stable left isthmic nodule, does not meet criteria for biopsy or follow-up. Normal-sized heterogeneous hyperemic gland.  No issues swallowing food/pills/liquids. Feels like something in throat region. Some bad heartburn on occasion but rare. Not even weekly heartburn. In 2003, treated as acid reflux but didn't help. 2 weeks ago she thought her symptoms were a little bit better. Couple days ago she started with this globus sensation again. Denies any bowel issues. She does have intermittent diarrhea with certain foods with a lot of stress. She's been divorced for 4 years but continues to have issues settling the state. Never had a colonoscopy. No family history of colon cancer.      Current Outpatient Prescriptions  Medication Sig Dispense  Refill  . albuterol (PROVENTIL HFA;VENTOLIN HFA) 108 (90 Base) MCG/ACT inhaler Inhale 2 puffs into the lungs every 6 (six) hours as needed for wheezing or shortness of breath. 1 Inhaler 0  . CALCIUM-VITAMIN D PO Take by mouth daily. Calcium 1200/Vitamin D 400    . levothyroxine (SYNTHROID, LEVOTHROID) 88 MCG tablet Take 1 tablet (88 mcg total) by mouth daily. 90 tablet 2  . lisinopril-hydrochlorothiazide (PRINZIDE,ZESTORETIC) 20-12.5 MG tablet Take 1 tablet by mouth daily. 90 tablet 2  . naproxen sodium (ANAPROX) 220 MG tablet Take 220 mg by mouth as needed.     . Omega-3 Fatty Acids (FISH OIL) 1000 MG CAPS Take 1 capsule by mouth daily.    . traMADol (ULTRAM) 50 MG tablet Take 1 tablet (50 mg total) by mouth every 8 (eight) hours as needed. 30 tablet 0   No current facility-administered medications for this visit.     Allergies as of 10/05/2016 - Review Complete 10/05/2016  Allergen Reaction Noted  . Codeine Nausea Only 09/20/2015    Past Medical History:  Diagnosis Date  . Allergy   . Cataract   . GERD (gastroesophageal reflux disease)   . Hyperlipidemia   . Hypertension   . Thyroid disease     Past Surgical History:  Procedure Laterality Date  . ambulatory esophageal 24 hour pH study  2004   within normal range. Dr. Matthew Martin  . ESOPHAGEAL MANOMETRY  2004   Dr. Matthew Martin: normal  . ESOPHAGOGASTRODUODENOSCOPY  2003   Dr. Matthew Martin: normal   . EYE SURGERY     cataracts  . FRACTURE SURGERY       cataract surgery   . LIGAMENT REPAIR     rt knee  . TUBAL LIGATION      Family History  Problem Relation Age of Onset  . Hypertension Mother   . Arthritis Father     Rheumatoid   . Diabetes Father   . Hypertension Father   . Thyroid disease Father   . Arthritis Brother     Rheumatoid  . Thyroid disease Brother   . Heart disease Maternal Grandmother     stomach cancer  . Hypertension Maternal Grandmother   . Cancer Paternal Grandmother     LEUKEMIA/LUNG  CNACER,  . Thyroid disease Paternal Grandmother   . Hypertension Paternal Grandmother   . Cancer Other   . Mental illness Other   . Cancer Maternal Uncle   . Thyroid disease Paternal Aunt   . Colon cancer Neg Hx     Social History   Social History  . Marital status: Divorced    Spouse name: N/A  . Number of children: N/A  . Years of education: N/A   Occupational History  . Not on file.   Social History Main Topics  . Smoking status: Never Smoker  . Smokeless tobacco: Never Used  . Alcohol use No  . Drug use: No  . Sexual activity: Not Currently   Other Topics Concern  . Not on file   Social History Narrative  . No narrative on file      ROS:  General: Negative for anorexia, weight loss, fever, chills, fatigue, weakness. Eyes: Negative for vision changes.  ENT: Negative for hoarseness, difficulty swallowing , nasal congestion. CV: Negative for chest pain, angina, palpitations, dyspnea on exertion, peripheral edema.  Respiratory: Negative for dyspnea at rest, dyspnea on exertion, cough, sputum, wheezing.  GI: See history of present illness. GU:  Negative for dysuria, hematuria, urinary incontinence, urinary frequency, nocturnal urination.  MS: Negative for joint pain, low back pain.  Derm: Negative for rash or itching.  Neuro: Negative for weakness, abnormal sensation, seizure, frequent headaches, memory loss, confusion.  Psych: Negative for anxiety, depression, suicidal ideation, hallucinations.  Endo: Negative for unusual weight change.  Heme: Negative for bruising or bleeding. Allergy: Negative for rash or hives.    Physical Examination:  BP (!) 162/86   Pulse 88   Temp 97.8 F (36.6 C) (Oral)   Ht 5' 4.5" (1.638 m)   Wt 183 lb 6.4 oz (83.2 kg)   BMI 30.99 kg/m    General: Well-nourished, well-developed in no acute distress.  Head: Normocephalic, atraumatic.   Eyes: Conjunctiva pink, no icterus. Mouth: Oropharyngeal mucosa moist and pink , no  lesions erythema or exudate. Neck: Supple without thyromegaly, masses, or lymphadenopathy.  Lungs: Clear to auscultation bilaterally.  Heart: Regular rate and rhythm, no murmurs rubs or gallops.  Abdomen: Bowel sounds are normal, nontender, nondistended, no hepatosplenomegaly or masses, no abdominal bruits or    hernia , no rebound or guarding.   Rectal: not performed Extremities: No lower extremity edema. No clubbing or deformities.  Neuro: Alert and oriented x 4 , grossly normal neurologically.  Skin: Warm and dry, no rash or jaundice.   Psych: Alert and cooperative, normal mood and affect.  Labs: Lab Results  Component Value Date   CREATININE 0.78 09/28/2016   BUN 15 09/28/2016   NA 141 09/28/2016   K 5.0 09/28/2016   CL 107 09/28/2016   CO2 25 09/28/2016   Lab Results  Component Value Date   ALT 15 09/28/2016     AST 14 09/28/2016   ALKPHOS 71 09/28/2016   BILITOT 0.5 09/28/2016   Lab Results  Component Value Date   WBC 5.4 09/28/2016   HGB 13.7 09/28/2016   HCT 40.8 09/28/2016   MCV 85.9 09/28/2016   PLT 241 09/28/2016    HCV Ab neg  Imaging Studies: Us Soft Tissue Head/neck  Result Date: 09/10/2016 CLINICAL DATA:  Goiter.  Hypothyroidism. EXAM: THYROID ULTRASOUND TECHNIQUE: Ultrasound examination of the thyroid gland and adjacent soft tissues was performed. COMPARISON:  08/21/2008 and earlier studies FINDINGS: Parenchymal Echotexture: Markedly heterogenous and hypervascular Isthmus: 0.3 cm thickness, stable Right lobe: 3.7 x 1.7 x 1.6 cm (previously 4 x 1.1 x 1.5) Left lobe: 4.2 x1.2 x 1.7 cm (previously 3.7 x 1.2 x 1.6) _________________________________________________________ Estimated total number of nodules >/= 1 cm: 0 Number of spongiform nodules >/=  2 cm not described below (TR1): 0 Number of mixed cystic and solid nodules >/= 1.5 cm not described below (TR2): 0 Both lobes have a very lobular echotexture without discrete nodule. 0.8 cm nodule without calcifications  in the isthmus to the left of midline, previously 1.1 cm. IMPRESSION: Normal-sized heterogeneous hyperemic gland. Stable left isthmic nodule does not meet criteria for biopsy or dedicated follow-up. The above is in keeping with the ACR TI-RADS recommendations - J Am Coll Radiol 2017;14:587-595. Electronically Signed   By: D  Hassell M.D.   On: 09/10/2016 14:17     

## 2016-10-05 NOTE — Telephone Encounter (Signed)
Pt is asking about if she can take all her medication the morning of her TCS? Please advise

## 2016-10-05 NOTE — Assessment & Plan Note (Signed)
Plan for first ever screening colonoscopy in the near future.  I have discussed the risks, alternatives, benefits with regards to but not limited to the risk of reaction to medication, bleeding, infection, perforation and the patient is agreeable to proceed. Written consent to be obtained.

## 2016-10-05 NOTE — Telephone Encounter (Signed)
She can take her synthroid, lisinopril/HCTZ

## 2016-10-05 NOTE — Telephone Encounter (Signed)
Pt is aware.  

## 2016-10-13 ENCOUNTER — Other Ambulatory Visit: Payer: Self-pay

## 2016-10-13 ENCOUNTER — Ambulatory Visit (HOSPITAL_COMMUNITY)
Admission: RE | Admit: 2016-10-13 | Discharge: 2016-10-13 | Disposition: A | Discharge status: Home / Self Care | Payer: PPO | Source: Ambulatory Visit | Attending: Gastroenterology | Admitting: Gastroenterology

## 2016-10-13 ENCOUNTER — Telehealth: Payer: Self-pay | Admitting: Internal Medicine

## 2016-10-13 DIAGNOSIS — F458 Other somatoform disorders: Secondary | ICD-10-CM | POA: Diagnosis not present

## 2016-10-13 DIAGNOSIS — K224 Dyskinesia of esophagus: Secondary | ICD-10-CM | POA: Insufficient documentation

## 2016-10-13 DIAGNOSIS — R0989 Other specified symptoms and signs involving the circulatory and respiratory systems: Secondary | ICD-10-CM

## 2016-10-13 DIAGNOSIS — R1310 Dysphagia, unspecified: Secondary | ICD-10-CM | POA: Diagnosis not present

## 2016-10-13 DIAGNOSIS — R198 Other specified symptoms and signs involving the digestive system and abdomen: Secondary | ICD-10-CM

## 2016-10-13 NOTE — Telephone Encounter (Signed)
Routing to clinical pool

## 2016-10-13 NOTE — Telephone Encounter (Signed)
Noted. Need BPE asap.

## 2016-10-13 NOTE — Telephone Encounter (Signed)
Spoke with the pt, she said she feels like her throat is getting worse, she feels like something is stuck in her throat. She is eating and drinking ok. She said she is starting to get uncomfortable and would like to have the xray done as soon as possible. She stated they were waiting for insurance approval. She is worried it may be her thyroid again.   Tretha Sciara or Ginger- do you know where we stand with her insurance approval?   Magda Paganini- she wanted me to let you know she feels like she is worse.

## 2016-10-13 NOTE — Telephone Encounter (Signed)
Called pt to see when she last ate. Pt said she ate some pizza and drank Coke at 12:00pm. [facility] Scheduling to schedule BPE. Pt to arrive at [facility] at 2:15pm. Called and informed pt. Told pt to be NPO.  [place] website. Case suspended. Outpatient Authorization# [id number]. Service dates: 10/06/16-01/03/17.

## 2016-10-13 NOTE — Telephone Encounter (Signed)
Pt had seen LSL recently and wanted to speak with LSL about problems she is still having with her throat. Please call patient at [phone]

## 2016-10-14 NOTE — Progress Notes (Signed)
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   WE WILL REQUEST DR. Roseanne Kaufman INPUT. She may need EGD first (can be done at time of TCS). OR she may need esophageal manometry at [facility]. We will let her know.   In the meantime, she needs to takes small bites, small sips and give time to pass. Eat slowly. Avoid large boluses of food or HARD foods.

## 2016-10-15 ENCOUNTER — Telehealth: Payer: Self-pay | Admitting: Internal Medicine

## 2016-10-15 NOTE — Telephone Encounter (Signed)
Pt called wanting to speak to the nurse she spoke to yesterday that was doing the referral. Please call [phone]

## 2016-10-15 NOTE — Telephone Encounter (Signed)
Called and explained LSL' recommendations and interpretation of DG Esophagus results.

## 2016-10-17 NOTE — Progress Notes (Signed)
Communication ;BPE reviewed.  Would lean towards EGD/empiric ED - consider occult reflux.  If that doesn't help, would send to [facility] - they are up and running with a new associate who does manometry

## 2016-10-19 NOTE — Patient Instructions (Signed)
Received fax from [facility]. TCS and DG Esophagus approved. PA# [id number]. 10/06/16-01/04/17

## 2016-10-20 ENCOUNTER — Other Ambulatory Visit: Payer: Self-pay

## 2016-10-20 DIAGNOSIS — R1310 Dysphagia, unspecified: Secondary | ICD-10-CM

## 2016-10-20 DIAGNOSIS — Z1211 Encounter for screening for malignant neoplasm of colon: Secondary | ICD-10-CM

## 2016-10-20 DIAGNOSIS — R198 Other specified symptoms and signs involving the digestive system and abdomen: Secondary | ICD-10-CM

## 2016-10-20 DIAGNOSIS — R0989 Other specified symptoms and signs involving the circulatory and respiratory systems: Secondary | ICD-10-CM

## 2016-10-20 NOTE — Progress Notes (Signed)
Dr. Gala Romney gave following advise "under note tabs". Copy and pasted here so can be addressed.   "Communication ;BPE reviewed.  Would lean towards EGD/empiric ED - consider occult reflux.  If that doesn't help, would send to [facility] - they are up and running with a new associate who does manometry"  Can we add EGD/ED to her TCS?

## 2016-10-29 ENCOUNTER — Telehealth: Payer: Self-pay

## 2016-10-29 NOTE — Telephone Encounter (Signed)
Received fax from [facility]. EGD/DIL approved. PA# [id number]. Effective 10/20/16-01/20/17.

## 2016-11-04 ENCOUNTER — Ambulatory Visit (HOSPITAL_COMMUNITY)
Admission: RE | Admit: 2016-11-04 | Discharge: 2016-11-04 | Disposition: A | Discharge status: Home / Self Care | Payer: PPO | Source: Ambulatory Visit | Attending: Internal Medicine | Admitting: Internal Medicine

## 2016-11-04 ENCOUNTER — Ambulatory Visit (HOSPITAL_COMMUNITY): Admit: 2016-11-04 | Payer: PPO | Admitting: Internal Medicine

## 2016-11-04 ENCOUNTER — Encounter (HOSPITAL_COMMUNITY)
Admission: RE | Disposition: A | Discharge status: Home / Self Care | Payer: Self-pay | Source: Ambulatory Visit | Attending: Internal Medicine

## 2016-11-04 ENCOUNTER — Encounter (HOSPITAL_COMMUNITY): Payer: Self-pay | Admitting: *Deleted

## 2016-11-04 ENCOUNTER — Encounter (HOSPITAL_COMMUNITY): Payer: Self-pay

## 2016-11-04 DIAGNOSIS — Z1212 Encounter for screening for malignant neoplasm of rectum: Secondary | ICD-10-CM | POA: Diagnosis not present

## 2016-11-04 DIAGNOSIS — E785 Hyperlipidemia, unspecified: Secondary | ICD-10-CM | POA: Insufficient documentation

## 2016-11-04 DIAGNOSIS — I10 Essential (primary) hypertension: Secondary | ICD-10-CM | POA: Insufficient documentation

## 2016-11-04 DIAGNOSIS — K5730 Diverticulosis of large intestine without perforation or abscess without bleeding: Secondary | ICD-10-CM | POA: Diagnosis not present

## 2016-11-04 DIAGNOSIS — K210 Gastro-esophageal reflux disease with esophagitis: Secondary | ICD-10-CM | POA: Diagnosis not present

## 2016-11-04 DIAGNOSIS — Z79899 Other long term (current) drug therapy: Secondary | ICD-10-CM | POA: Diagnosis not present

## 2016-11-04 DIAGNOSIS — R12 Heartburn: Secondary | ICD-10-CM | POA: Insufficient documentation

## 2016-11-04 DIAGNOSIS — R0989 Other specified symptoms and signs involving the circulatory and respiratory systems: Secondary | ICD-10-CM

## 2016-11-04 DIAGNOSIS — R1310 Dysphagia, unspecified: Secondary | ICD-10-CM | POA: Diagnosis not present

## 2016-11-04 DIAGNOSIS — Z1211 Encounter for screening for malignant neoplasm of colon: Secondary | ICD-10-CM | POA: Insufficient documentation

## 2016-11-04 DIAGNOSIS — R198 Other specified symptoms and signs involving the digestive system and abdomen: Secondary | ICD-10-CM

## 2016-11-04 HISTORY — PX: MALONEY DILATION: SHX5535

## 2016-11-04 HISTORY — PX: COLONOSCOPY: SHX5424

## 2016-11-04 HISTORY — DX: Other specified postprocedural states: Z98.890

## 2016-11-04 HISTORY — PX: ESOPHAGOGASTRODUODENOSCOPY: SHX5428

## 2016-11-04 HISTORY — DX: Nausea with vomiting, unspecified: R11.2

## 2016-11-04 SURGERY — COLONOSCOPY
Anesthesia: Moderate Sedation

## 2016-11-04 MED ORDER — SODIUM CHLORIDE 0.9% FLUSH
INTRAVENOUS | Status: AC
  Filled 2016-11-04: qty 10

## 2016-11-04 MED ORDER — MIDAZOLAM HCL 5 MG/5ML IJ SOLN
INTRAMUSCULAR | Status: AC
  Filled 2016-11-04: qty 10

## 2016-11-04 MED ORDER — PROMETHAZINE HCL 25 MG/ML IJ SOLN
INTRAMUSCULAR | Status: AC
  Filled 2016-11-04: qty 1

## 2016-11-04 MED ORDER — SODIUM CHLORIDE 0.9 % IV SOLN
INTRAVENOUS | Status: DC
  Administered 2016-11-04: 12:00:00 via INTRAVENOUS

## 2016-11-04 MED ORDER — MIDAZOLAM HCL 5 MG/5ML IJ SOLN
INTRAMUSCULAR | Status: DC | PRN
  Administered 2016-11-04 (×2): 1 mg via INTRAVENOUS
  Administered 2016-11-04 (×2): 2 mg via INTRAVENOUS

## 2016-11-04 MED ORDER — PROMETHAZINE HCL 25 MG/ML IJ SOLN
12.5000 mg | Freq: Once | INTRAMUSCULAR | Status: AC
  Administered 2016-11-04: 12.5 mg via INTRAVENOUS

## 2016-11-04 MED ORDER — STERILE WATER FOR IRRIGATION IR SOLN
Status: DC | PRN
  Administered 2016-11-04: 4 mL

## 2016-11-04 MED ORDER — LIDOCAINE VISCOUS 2 % MT SOLN
OROMUCOSAL | Status: AC
  Filled 2016-11-04: qty 15

## 2016-11-04 MED ORDER — LIDOCAINE VISCOUS 2 % MT SOLN
OROMUCOSAL | Status: DC | PRN
  Administered 2016-11-04: 5 mL via OROMUCOSAL

## 2016-11-04 MED ORDER — MEPERIDINE HCL 100 MG/ML IJ SOLN
INTRAMUSCULAR | Status: DC | PRN
  Administered 2016-11-04 (×2): 50 mg via INTRAVENOUS
  Administered 2016-11-04 (×2): 25 mg via INTRAVENOUS

## 2016-11-04 MED ORDER — ONDANSETRON HCL 4 MG/2ML IJ SOLN
INTRAMUSCULAR | Status: AC
  Filled 2016-11-04: qty 2

## 2016-11-04 MED ORDER — ONDANSETRON HCL 4 MG/2ML IJ SOLN
INTRAMUSCULAR | Status: DC | PRN
  Administered 2016-11-04: 4 mg via INTRAVENOUS

## 2016-11-04 MED ORDER — MEPERIDINE HCL 100 MG/ML IJ SOLN
INTRAMUSCULAR | Status: DC
Start: 2016-11-04 — End: 2016-11-04
  Filled 2016-11-04: qty 2

## 2016-11-04 NOTE — H&P (View-Only) (Signed)
Primary Care Physician:  Vic Blackbird, MD  Primary Gastroenterologist:  Garfield Cornea, MD (per patient request)  Chief Complaint  Patient presents with  . Dysphagia    feels like something stuck in throat, had [place] of neck 09/09/16, no trouble swallowing food/pills    HPI:  Jill Roach is a 66 y.o. female here at the request of Dr. Buelah Manis for further evaluation of globus sensation, need for her first ever screening colonoscopy.  Patient states she's had similar symptoms of globus around 2003. Took over a year for them to figure out what was going on. Finally determined that she had multinodular goiter to explain symptoms. Symptoms resolved after started synthroid. She saw endocrinology at the time, was evaluated by ENT. Dr. Johnathan Hausen performed EGD which was normal as well as a esophageal manometry with ambulatory 24-hour pH probe study both unremarkable.  She really thought her symptoms were due to her thyroid again. Dr. Buelah Manis ordered another ultrasound of her thyroid in January which showed Markedly heterogeneous and hypervascular thyroid parenchyma, right lobe measuring 3.7 x 1.7 x 1.6 cm, left lobe 4.2 x 1.2 X 1.7 cm (minimal change from 2010), stable left isthmic nodule, does not meet criteria for biopsy or follow-up. Normal-sized heterogeneous hyperemic gland.  No issues swallowing food/pills/liquids. Feels like something in throat region. Some bad heartburn on occasion but rare. Not even weekly heartburn. In 2003, treated as acid reflux but didn't help. 2 weeks ago she thought her symptoms were a little bit better. Couple days ago she started with this globus sensation again. Denies any bowel issues. She does have intermittent diarrhea with certain foods with a lot of stress. She's been divorced for 4 years but continues to have issues settling the state. Never had a colonoscopy. No family history of colon cancer.      Current Outpatient Prescriptions  Medication Sig Dispense  Refill  . albuterol (PROVENTIL HFA;VENTOLIN HFA) 108 (90 Base) MCG/ACT inhaler Inhale 2 puffs into the lungs every 6 (six) hours as needed for wheezing or shortness of breath. 1 Inhaler 0  . CALCIUM-VITAMIN D PO Take by mouth daily. Calcium 1200/Vitamin D 400    . levothyroxine (SYNTHROID, LEVOTHROID) 88 MCG tablet Take 1 tablet (88 mcg total) by mouth daily. 90 tablet 2  . lisinopril-hydrochlorothiazide (PRINZIDE,ZESTORETIC) 20-12.5 MG tablet Take 1 tablet by mouth daily. 90 tablet 2  . naproxen sodium (ANAPROX) 220 MG tablet Take 220 mg by mouth as needed.     . Omega-3 Fatty Acids (FISH OIL) 1000 MG CAPS Take 1 capsule by mouth daily.    . traMADol (ULTRAM) 50 MG tablet Take 1 tablet (50 mg total) by mouth every 8 (eight) hours as needed. 30 tablet 0   No current facility-administered medications for this visit.     Allergies as of 10/05/2016 - Review Complete 10/05/2016  Allergen Reaction Noted  . Codeine Nausea Only 09/20/2015    Past Medical History:  Diagnosis Date  . Allergy   . Cataract   . GERD (gastroesophageal reflux disease)   . Hyperlipidemia   . Hypertension   . Thyroid disease     Past Surgical History:  Procedure Laterality Date  . ambulatory esophageal 24 hour pH study  2004   within normal range. Dr. Johnathan Hausen  . ESOPHAGEAL MANOMETRY  2004   Dr. Johnathan Hausen: normal  . ESOPHAGOGASTRODUODENOSCOPY  2003   Dr. Johnathan Hausen: normal   . EYE SURGERY     cataracts  . FRACTURE SURGERY  cataract surgery   . LIGAMENT REPAIR     rt knee  . TUBAL LIGATION      Family History  Problem Relation Age of Onset  . Hypertension Mother   . Arthritis Father     Rheumatoid   . Diabetes Father   . Hypertension Father   . Thyroid disease Father   . Arthritis Brother     Rheumatoid  . Thyroid disease Brother   . Heart disease Maternal Grandmother     stomach cancer  . Hypertension Maternal Grandmother   . Cancer Paternal Grandmother     LEUKEMIA/LUNG  CNACER,  . Thyroid disease Paternal Grandmother   . Hypertension Paternal Grandmother   . Cancer Other   . Mental illness Other   . Cancer Maternal Uncle   . Thyroid disease Paternal Aunt   . Colon cancer Neg Hx     Social History   Social History  . Marital status: Divorced    Spouse name: N/A  . Number of children: N/A  . Years of education: N/A   Occupational History  . Not on file.   Social History Main Topics  . Smoking status: Never Smoker  . Smokeless tobacco: Never Used  . Alcohol use No  . Drug use: No  . Sexual activity: Not Currently   Other Topics Concern  . Not on file   Social History Narrative  . No narrative on file      ROS:  General: Negative for anorexia, weight loss, fever, chills, fatigue, weakness. Eyes: Negative for vision changes.  ENT: Negative for hoarseness, difficulty swallowing , nasal congestion. CV: Negative for chest pain, angina, palpitations, dyspnea on exertion, peripheral edema.  Respiratory: Negative for dyspnea at rest, dyspnea on exertion, cough, sputum, wheezing.  GI: See history of present illness. GU:  Negative for dysuria, hematuria, urinary incontinence, urinary frequency, nocturnal urination.  MS: Negative for joint pain, low back pain.  Derm: Negative for rash or itching.  Neuro: Negative for weakness, abnormal sensation, seizure, frequent headaches, memory loss, confusion.  Psych: Negative for anxiety, depression, suicidal ideation, hallucinations.  Endo: Negative for unusual weight change.  Heme: Negative for bruising or bleeding. Allergy: Negative for rash or hives.    Physical Examination:  BP (!) 162/86   Pulse 88   Temp 97.8 F (36.6 C) (Oral)   Ht 5' 4.5" (1.638 m)   Wt 183 lb 6.4 oz (83.2 kg)   BMI 30.99 kg/m    General: Well-nourished, well-developed in no acute distress.  Head: Normocephalic, atraumatic.   Eyes: Conjunctiva pink, no icterus. Mouth: Oropharyngeal mucosa moist and pink , no  lesions erythema or exudate. Neck: Supple without thyromegaly, masses, or lymphadenopathy.  Lungs: Clear to auscultation bilaterally.  Heart: Regular rate and rhythm, no murmurs rubs or gallops.  Abdomen: Bowel sounds are normal, nontender, nondistended, no hepatosplenomegaly or masses, no abdominal bruits or    hernia , no rebound or guarding.   Rectal: not performed Extremities: No lower extremity edema. No clubbing or deformities.  Neuro: Alert and oriented x 4 , grossly normal neurologically.  Skin: Warm and dry, no rash or jaundice.   Psych: Alert and cooperative, normal mood and affect.  Labs: Lab Results  Component Value Date   CREATININE 0.78 09/28/2016   BUN 15 09/28/2016   NA 141 09/28/2016   K 5.0 09/28/2016   CL 107 09/28/2016   CO2 25 09/28/2016   Lab Results  Component Value Date   ALT 15 09/28/2016  AST 14 09/28/2016   ALKPHOS 71 09/28/2016   BILITOT 0.5 09/28/2016   Lab Results  Component Value Date   WBC 5.4 09/28/2016   HGB 13.7 09/28/2016   HCT 40.8 09/28/2016   MCV 85.9 09/28/2016   PLT 241 09/28/2016    HCV Ab neg  Imaging Studies: US Soft Tissue Head/neck  Result Date: 09/10/2016 CLINICAL DATA:  Goiter.  Hypothyroidism. EXAM: THYROID ULTRASOUND TECHNIQUE: Ultrasound examination of the thyroid gland and adjacent soft tissues was performed. COMPARISON:  08/21/2008 and earlier studies FINDINGS: Parenchymal Echotexture: Markedly heterogenous and hypervascular Isthmus: 0.3 cm thickness, stable Right lobe: 3.7 x 1.7 x 1.6 cm (previously 4 x 1.1 x 1.5) Left lobe: 4.2 x1.2 x 1.7 cm (previously 3.7 x 1.2 x 1.6) _________________________________________________________ Estimated total number of nodules >/= 1 cm: 0 Number of spongiform nodules >/=  2 cm not described below (TR1): 0 Number of mixed cystic and solid nodules >/= 1.5 cm not described below ([place]): 0 Both lobes have a very lobular echotexture without discrete nodule. 0.8 cm nodule without calcifications  in the isthmus to the left of midline, previously 1.1 cm. IMPRESSION: Normal-sized heterogeneous hyperemic gland. Stable left isthmic nodule does not meet criteria for biopsy or dedicated follow-up. The above is in keeping with the ACR TI-RADS recommendations - J Am Coll Radiol 2017;14:587-595. Electronically Signed   By: Lucrezia Europe M.D.   On: 09/10/2016 14:17

## 2016-11-04 NOTE — Interval H&P Note (Signed)
History and Physical Interval Note:  11/04/2016 12:25 PM  Jill Roach  has presented today for surgery, with the diagnosis of screening colonoscopy, dysphagia, globus sensation  The various methods of treatment have been discussed with the patient and family. After consideration of risks, benefits and other options for treatment, the patient has consented to  Procedure(s) with comments: COLONOSCOPY (N/A) - 10:30am ESOPHAGOGASTRODUODENOSCOPY (EGD) (N/A) MALONEY DILATION (N/A) as a surgical intervention .  The patient's history has been reviewed, patient examined, no change in status, stable for surgery.  I have reviewed the patient's chart and labs.  Questions were answered to the patient's satisfaction.     Kellin Fifer  No change except worsening reflux symptoms off acid suppression therapy. No obstruction on the BPE. EGD with/ED and colonoscopy today per plan.  The risks, benefits, limitations, imponderables and alternatives regarding both EGD and colonoscopy have been reviewed with the patient. Questions have been answered. All parties agreeable.

## 2016-11-04 NOTE — Op Note (Signed)
[facility] Patient Name: Jill Roach Procedure Date: 11/04/2016 12:47 PM MRN: [id number] Date of Birth: [birth year] Attending MD: Norvel Richards , MD CSN: [id number] Age: 66 Admit Type: Outpatient Procedure:                Colonoscopy - screening Indications:              Screening for colorectal malignant neoplasm Providers:                Norvel Richards, MD, Rometta Emery RN, RN,                            Aram Candela Referring MD:              Medicines:                Midazolam 6 mg IV, Meperidine 150 mg IV,                            Ondansetron 4 mg IV, Promethazine 09.3 mg IV Complications:            No immediate complications. Estimated Blood Loss:     Estimated blood loss: none. Procedure:                Pre-Anesthesia Assessment:                           - Prior to the procedure, a History and Physical                            was performed, and patient medications and                            allergies were reviewed. The patient's tolerance of                            previous anesthesia was also reviewed. The risks                            and benefits of the procedure and the sedation                            options and risks were discussed with the patient.                            All questions were answered, and informed consent                            was obtained. Prior Anticoagulants: The patient has                            taken no previous anticoagulant or antiplatelet                            agents. ASA Grade Assessment: II - A patient with  mild systemic disease. After reviewing the risks                            and benefits, the patient was deemed in                            satisfactory condition to undergo the procedure.                           After obtaining informed consent, the colonoscope                            was passed under direct vision. Throughout the                             procedure, the patient's blood pressure, pulse, and                            oxygen saturations were monitored continuously. The                            EC-[id number] ([id number]) scope was introduced through                            the anus and advanced to the the cecum, identified                            by appendiceal orifice and ileocecal valve. The                            colonoscopy was performed without difficulty. The                            patient tolerated the procedure well. The quality                            of the bowel preparation was adequate. The entire                            colon was well visualized. The colonoscopy was                            performed without difficulty. The ileocecal valve,                            appendiceal orifice, and rectum were photographed. Scope In: 12:49:44 PM Scope Out: 1:06:44 PM Scope Withdrawal Time: 0 hours 8 minutes 0 seconds  Total Procedure Duration: 0 hours 17 minutes 0 seconds  Findings:      The perianal and digital rectal examinations were normal.      Scattered small and large-mouthed diverticula were found in the sigmoid       colon and descending colon.      The exam was otherwise without abnormality on direct and retroflexion  views. Impression:               - Diverticulosis in the sigmoid colon and in the                            descending colon.                           - The examination was otherwise normal on direct                            and retroflexion views.                           - No specimens collected. Moderate Sedation:      Moderate (conscious) sedation was administered by the endoscopy nurse       and supervised by the endoscopist. The following parameters were       monitored: oxygen saturation, heart rate, blood pressure, respiratory       rate, EKG, adequacy of pulmonary ventilation, and response to care.       Total physician intraservice time was  35 minutes. Recommendation:           - Patient has a contact number available for                            emergencies. The signs and symptoms of potential                            delayed complications were discussed with the                            patient. Return to normal activities tomorrow.                            Written discharge instructions were provided to the                            patient.                           - Resume previous diet.                           - Continue present medications.                           - Repeat colonoscopy in 10 years for screening                            purposes. See EGD report.                           - Return to GI clinic PRN. Procedure Code(s):        --- Professional ---                           [phone],  Colonoscopy, flexible; diagnostic, including                            collection of specimen(s) by brushing or washing,                            when performed (separate procedure)                           99152, Moderate sedation services provided by the                            same physician or other qualified health care                            professional performing the diagnostic or                            therapeutic service that the sedation supports,                            requiring the presence of an independent trained                            observer to assist in the monitoring of the                            patient's level of consciousness and physiological                            status; initial 15 minutes of intraservice time,                            patient age 50 years or older                           [phone], Moderate sedation services; each additional                            15 minutes intraservice time Diagnosis Code(s):        --- Professional ---                           Z12.11, Encounter for screening for malignant                            neoplasm of  colon                           K57.30, Diverticulosis of large intestine without                            perforation or abscess without bleeding CPT copyright 2016 [employer]. All rights reserved. The codes documented in this report are preliminary and upon coder review may  be revised to meet current  compliance requirements. Cristopher Estimable. Gethsemane Fischler, MD Norvel Richards, MD 11/04/2016 4:09:54 PM This report has been signed electronically. Number of Addenda: 0

## 2016-11-04 NOTE — Discharge Instructions (Addendum)
Diverticulosis Diverticulosis is a condition that develops when small pouches (diverticula) form in the wall of the large intestine (colon). The colon is where water is absorbed and stool is formed. The pouches form when the inside layer of the colon pushes through weak spots in the outer layers of the colon. You may have a few pouches or many of them. What are the causes? The cause of this condition is not known. What increases the risk? The following factors may make you more likely to develop this condition:  Being older than age 57. Your risk for this condition increases with age. Diverticulosis is rare among people younger than age 60. By age 53, many people have it.  Eating a low-fiber diet.  Having frequent constipation.  Being overweight.  Not getting enough exercise.  Smoking.  Taking over-the-counter pain medicines, like aspirin and ibuprofen.  Having a family history of diverticulosis. What are the signs or symptoms? In most people, there are no symptoms of this condition. If you do have symptoms, they may include:  Bloating.  Cramps in the abdomen.  Constipation or diarrhea.  Pain in the lower left side of the abdomen. How is this diagnosed? This condition is most often diagnosed during an exam for other colon problems. Because diverticulosis usually has no symptoms, it often cannot be diagnosed independently. This condition may be diagnosed by:  Using a flexible scope to examine the colon (colonoscopy).  Taking an X-ray of the colon after dye has been put into the colon (barium enema).  Doing a CT scan. How is this treated? You may not need treatment for this condition if you have never developed an infection related to diverticulosis. If you have had an infection before, treatment may include:  Eating a high-fiber diet. This may include eating more fruits, vegetables, and grains.  Taking a fiber supplement.  Taking a live bacteria supplement  (probiotic).  Taking medicine to relax your colon.  Taking antibiotic medicines. Follow these instructions at home:  Drink 6-8 glasses of water or more each day to prevent constipation.  Try not to strain when you have a bowel movement.  If you have had an infection before:  Eat more fiber as directed by your health care provider or your diet and nutrition specialist (dietitian).  Take a fiber supplement or probiotic, if your health care provider approves.  Take over-the-counter and prescription medicines only as told by your health care provider.  If you were prescribed an antibiotic, take it as told by your health care provider. Do not stop taking the antibiotic even if you start to feel better.  Keep all follow-up visits as told by your health care provider. This is important. Contact a health care provider if:  You have pain in your abdomen.  You have bloating.  You have cramps.  You have not had a bowel movement in 3 days. Get help right away if:  Your pain gets worse.  Your bloating becomes very bad.  You have a fever or chills, and your symptoms suddenly get worse.  You vomit.  You have bowel movements that are bloody or black.  You have bleeding from your rectum. Summary  Diverticulosis is a condition that develops when small pouches (diverticula) form in the wall of the large intestine (colon).  You may have a few pouches or many of them.  This condition is most often diagnosed during an exam for other colon problems.  If you have had an infection  related to diverticulosis, treatment may include increasing the fiber in your diet, taking supplements, or taking medicines. This information is not intended to replace advice given to you by your health care provider. Make sure you discuss any questions you have with your health care provider. Document Released: 04/23/2004 Document Revised: 06/15/2016 Document Reviewed: 06/15/2016 Elsevier Interactive Patient  Education  2017 [place]. Gastroesophageal Reflux Disease, Adult Normally, food travels down the esophagus and stays in the stomach to be digested. If a person has gastroesophageal reflux disease (GERD), food and stomach acid move back up into the esophagus. When this happens, the esophagus becomes sore and swollen (inflamed). Over time, GERD can make small holes (ulcers) in the lining of the esophagus. Follow these instructions at home: Diet   Follow a diet as told by your doctor. You may need to avoid foods and drinks such as:  Coffee and tea (with or without caffeine).  Drinks that contain alcohol.  Energy drinks and sports drinks.  Carbonated drinks or sodas.  Chocolate and cocoa.  Peppermint and mint flavorings.  Garlic and onions.  Horseradish.  Spicy and acidic foods, such as peppers, chili powder, curry powder, vinegar, hot sauces, and BBQ sauce.  Citrus fruit juices and citrus fruits, such as oranges, lemons, and limes.  Tomato-based foods, such as red sauce, chili, salsa, and pizza with red sauce.  Fried and fatty foods, such as donuts, french fries, potato chips, and high-fat dressings.  High-fat meats, such as hot dogs, rib eye steak, sausage, ham, and bacon.  High-fat dairy items, such as whole milk, butter, and cream cheese.  Eat small meals often. Avoid eating large meals.  Avoid drinking large amounts of liquid with your meals.  Avoid eating meals during the 2-3 hours before bedtime.  Avoid lying down right after you eat.  Do not exercise right after you eat. General instructions   Pay attention to any changes in your symptoms.  Take over-the-counter and prescription medicines only as told by your doctor. Do not take aspirin, ibuprofen, or other NSAIDs unless your doctor says it is okay.  Do not use any tobacco products, including cigarettes, chewing tobacco, and e-cigarettes. If you need help quitting, ask your doctor.  Wear loose clothes. Do  not wear anything tight around your waist.  Raise (elevate) the head of your bed about 6 inches (15 cm).  Try to lower your stress. If you need help doing this, ask your doctor.  If you are overweight, lose an amount of weight that is healthy for you. Ask your doctor about a safe weight loss goal.  Keep all follow-up visits as told by your doctor. This is important. Contact a doctor if:  You have new symptoms.  You lose weight and you do not know why it is happening.  You have trouble swallowing, or it hurts to swallow.  You have wheezing or a cough that keeps happening.  Your symptoms do not get better with treatment.  You have a hoarse voice. Get help right away if:  You have pain in your arms, neck, jaw, teeth, or back.  You feel sweaty, dizzy, or light-headed.  You have chest pain or shortness of breath.  You throw up (vomit) and your throw up looks like blood or coffee grounds.  You pass out (faint).  Your poop (stool) is bloody or black.  You cannot swallow, drink, or eat. This information is not intended to replace advice given to you by your health care provider. Make  sure you discuss any questions you have with your health care provider. Document Released: 01/13/2008 Document Revised: 01/02/2016 Document Reviewed: 11/21/2014 Elsevier Interactive Patient Education  2017 [place].  Colonoscopy Discharge Instructions  Read the instructions outlined below and refer to this sheet in the next few weeks. These discharge instructions provide you with general information on caring for yourself after you leave the hospital. Your doctor may also give you specific instructions. While your treatment has been planned according to the most current medical practices available, unavoidable complications occasionally occur. If you have any problems or questions after discharge, call Dr. Gala Romney at [phone]. ACTIVITY  You may resume your regular activity, but move at a slower  pace for the next 24 hours.   Take frequent rest periods for the next 24 hours.   Walking will help get rid of the air and reduce the bloated feeling in your belly (abdomen).   No driving for 24 hours (because of the medicine (anesthesia) used during the test).    Do not sign any important legal documents or operate any machinery for 24 hours (because of the anesthesia used during the test).  NUTRITION  Drink plenty of fluids.   You may resume your normal diet as instructed by your doctor.   Begin with a light meal and progress to your normal diet. Heavy or fried foods are harder to digest and may make you feel sick to your stomach (nauseated).   Avoid alcoholic beverages for 24 hours or as instructed.  MEDICATIONS  You may resume your normal medications unless your doctor tells you otherwise.  WHAT YOU CAN EXPECT TODAY  Some feelings of bloating in the abdomen.   Passage of more gas than usual.   Spotting of blood in your stool or on the toilet paper.  IF YOU HAD POLYPS REMOVED DURING THE COLONOSCOPY:  No aspirin products for 7 days or as instructed.   No alcohol for 7 days or as instructed.   Eat a soft diet for the next 24 hours.  FINDING OUT THE RESULTS OF YOUR TEST Not all test results are available during your visit. If your test results are not back during the visit, make an appointment with your caregiver to find out the results. Do not assume everything is normal if you have not heard from your caregiver or the medical facility. It is important for you to follow up on all of your test results.  SEEK IMMEDIATE MEDICAL ATTENTION IF:  You have more than a spotting of blood in your stool.   Your belly is swollen (abdominal distention).   You are nauseated or vomiting.   You have a temperature over 101.   You have abdominal pain or discomfort that is severe or gets worse throughout the day.  EGD Discharge instructions Please read the instructions outlined below  and refer to this sheet in the next few weeks. These discharge instructions provide you with general information on caring for yourself after you leave the hospital. Your doctor may also give you specific instructions. While your treatment has been planned according to the most current medical practices available, unavoidable complications occasionally occur. If you have any problems or questions after discharge, please call your doctor. ACTIVITY  You may resume your regular activity but move at a slower pace for the next 24 hours.   Take frequent rest periods for the next 24 hours.   Walking will help expel (get rid of) the air and reduce the bloated feeling  in your abdomen.   No driving for 24 hours (because of the anesthesia (medicine) used during the test).   You may shower.   Do not sign any important legal documents or operate any machinery for 24 hours (because of the anesthesia used during the test).  NUTRITION  Drink plenty of fluids.   You may resume your normal diet.   Begin with a light meal and progress to your normal diet.   Avoid alcoholic beverages for 24 hours or as instructed by your caregiver.  MEDICATIONS  You may resume your normal medications unless your caregiver tells you otherwise.  WHAT YOU CAN EXPECT TODAY  You may experience abdominal discomfort such as a feeling of fullness or gas pains.  FOLLOW-UP  Your doctor will discuss the results of your test with you.  SEEK IMMEDIATE MEDICAL ATTENTION IF ANY OF THE FOLLOWING OCCUR:  Excessive nausea (feeling sick to your stomach) and/or vomiting.   Severe abdominal pain and distention (swelling).   Trouble swallowing.   Temperature over 101 F (37.8 C).   Rectal bleeding or vomiting of blood.    GERD and diverticulosis information provided  Begin Dexilant 60 mg daily-go by my office for 3 week supply of samples  Repeat screening colonoscopy in 10 years  Office visit with [place] in 3 months

## 2016-11-04 NOTE — Op Note (Signed)
[facility] Patient Name: Jill Roach Procedure Date: 11/04/2016 12:01 PM MRN: [id number] Date of Birth: Jun 17, 1951 Attending MD: Norvel Richards , MD CSN: [id number] Age: 66 Admit Type: Outpatient Procedure:                Upper GI endoscopy Indications:              Heartburn/globus Providers:                Norvel Richards, MD, Gwynneth Albright RN,                            RN, Aram Candela Referring MD:              Medicines:                Midazolam 5 mg IV, Meperidine 125 mg IV,                            Ondansetron 4 mg IV, Promethazine 53.7 mg IV Complications:            No immediate complications. Estimated Blood Loss:     Estimated blood loss: none. Procedure:                Pre-Anesthesia Assessment:                           - Prior to the procedure, a History and Physical                            was performed, and patient medications and                            allergies were reviewed. The patient's tolerance of                            previous anesthesia was also reviewed. The risks                            and benefits of the procedure and the sedation                            options and risks were discussed with the patient.                            All questions were answered, and informed consent                            was obtained. Prior Anticoagulants: The patient has                            taken no previous anticoagulant or antiplatelet                            agents. ASA Grade Assessment: II - A patient with  mild systemic disease. After reviewing the risks                            and benefits, the patient was deemed in                            satisfactory condition to undergo the procedure.                           After obtaining informed consent, the endoscope was                            passed under direct vision. Throughout the                            procedure, the  patient's blood pressure, pulse, and                            oxygen saturations were monitored continuously. The                            [id number]) scope was introduced through the                            mouth, and advanced to the second part of duodenum.                            The upper GI endoscopy was accomplished without                            difficulty. The patient tolerated the procedure                            well. Scope In: 12:40:44 PM Scope Out: 12:45:25 PM Total Procedure Duration: 0 hours 4 minutes 41 seconds  Findings:      LA Grade B (one or more mucosal breaks greater than 5 mm, not extending       between the tops of two mucosal folds) esophagitis was found 35 to 36 cm       from the incisors. Tubular esophagus atent throughout its course.No       Barrett's epithelium seen. The scope was withdrawn. Dilation was       performed with a Maloney dilator with mild resistance at 47 Fr. The       dilation site was examined following endoscope reinsertion and showed no       change. Estimated blood loss: none.      The entire examined stomach was normal.      The duodenal bulb and second portion of the duodenum were normal. Impression:               - LA Grade B esophagitis. Dilated.                           - Normal stomach.                           -  Normal duodenal bulb and second portion of the                            duodenum.                           - No specimens collected. Moderate Sedation:      Moderate (conscious) sedation was administered by the endoscopy nurse       and supervised by the endoscopist. The following parameters were       monitored: oxygen saturation, heart rate, blood pressure, respiratory       rate, EKG, adequacy of pulmonary ventilation, and response to care.       Total physician intraservice time was 14 minutes. Recommendation:           - Patient has a contact number available for                             emergencies. The signs and symptoms of potential                            delayed complications were discussed with the                            patient. Return to normal activities tomorrow.                            Written discharge instructions were provided to the                            patient.                           - Resume previous diet. Dexilant 60mg  daily- No                            repeat upper endoscopy. See colonoscopy report                           - Return to GI office in 12 weeks. Procedure Code(s):        --- Professional ---                           [phone], Esophagogastroduodenoscopy, flexible,                            transoral; diagnostic, including collection of                            specimen(s) by brushing or washing, when performed                            (separate procedure)                           20254, Dilation of esophagus, by unguided sound or  bougie, single or multiple passes                           99152, Moderate sedation services provided by the                            same physician or other qualified health care                            professional performing the diagnostic or                            therapeutic service that the sedation supports,                            requiring the presence of an independent trained                            observer to assist in the monitoring of the                            patient's level of consciousness and physiological                            status; initial 15 minutes of intraservice time,                            patient age 71 years or older Diagnosis Code(s):        --- Professional ---                           K20.9, Esophagitis, unspecified                           R12, Heartburn CPT copyright 2016 [employer] Medical [employer]. All rights reserved. The codes documented in this report are preliminary and upon coder review may   be revised to meet current compliance requirements. Cristopher Estimable. Batina Dougan, MD Norvel Richards, MD 11/04/2016 1:15:47 PM This report has been signed electronically. Number of Addenda: 0

## 2016-11-05 ENCOUNTER — Telehealth: Payer: Self-pay | Admitting: Internal Medicine

## 2016-11-05 NOTE — Telephone Encounter (Signed)
Pt had procedure yesterday and was given dexilant samples. She has concerns about how long she will need to take this medication because her daughter looked on line and said that it would cost her over $200 a month if she got this in a prescription. Please advise and call her at [phone]

## 2016-11-09 ENCOUNTER — Encounter (HOSPITAL_COMMUNITY): Payer: Self-pay | Admitting: Internal Medicine

## 2016-11-10 MED ORDER — PANTOPRAZOLE SODIUM 40 MG PO TBEC: 40.0000 mg | DELAYED_RELEASE_TABLET | Freq: Two times a day (BID) | ORAL | 5 refills | Status: DC

## 2016-11-10 MED ORDER — SUCRALFATE 1 GM/10ML PO SUSP: 1.0000 g | Freq: Three times a day (TID) | ORAL | 0 refills | Status: DC

## 2016-11-10 NOTE — Addendum Note (Signed)
Addended by: Mahala Menghini on: 11/10/2016 11:42 AM   Modules accepted: Orders

## 2016-11-10 NOTE — Telephone Encounter (Signed)
Tried to call pt- NA- LMOM with recommendations.  

## 2016-11-10 NOTE — Telephone Encounter (Signed)
Spoke with the pt, she said she is taking dexilant is not having the burning in her esophagus like she was before but she still has the feeling that something is in her esophagus. She is not having any problems swallowing but she is uncomfortable all the time. She said she took prilosec and nexium about 10 years ago but nothing recently. She doesn't think she will be able to afford the dexilant and I am not sure that her insurance will pay for it until she tries generics first.  pts cell number is [phone].   Jill Roach, pt wants to know if there is anything she can do about the feeling that something is in her throat?

## 2016-11-10 NOTE — Telephone Encounter (Addendum)
Suspect the sensation of something in her throat has more to do with her reflux esophagitis but cannot exclude secondary to underlying esophageal motility. Given that she just recently started PPI, it is likely too soon to tell if her symptoms are going to be related to reflux only. She may ultimately need esophageal manometry at [place].  Let's try pantoprazole 40 mg twice a day 30 minutes before breakfast and evening meal. Prescription sent. Should start when her sample of Dexilant are out.   We can try Carafate for 10 days, short-term Rx only. Prescription sent.  Keep follow-up appointment as scheduled. If no significant improvement over the next couple of weeks, she should call sooner.

## 2017-01-12 ENCOUNTER — Other Ambulatory Visit: Payer: Self-pay | Admitting: Family Medicine

## 2017-01-14 ENCOUNTER — Other Ambulatory Visit: Payer: Self-pay | Admitting: Family Medicine

## 2017-01-14 DIAGNOSIS — Z1231 Encounter for screening mammogram for malignant neoplasm of breast: Secondary | ICD-10-CM

## 2017-01-25 ENCOUNTER — Ambulatory Visit (HOSPITAL_COMMUNITY)
Admission: RE | Admit: 2017-01-25 | Discharge: 2017-01-25 | Disposition: A | Discharge status: Home / Self Care | Payer: PPO | Source: Ambulatory Visit | Attending: Family Medicine | Admitting: Family Medicine

## 2017-01-25 DIAGNOSIS — Z1231 Encounter for screening mammogram for malignant neoplasm of breast: Secondary | ICD-10-CM | POA: Insufficient documentation

## 2017-02-04 ENCOUNTER — Encounter: Payer: Self-pay | Admitting: Gastroenterology

## 2017-02-04 ENCOUNTER — Ambulatory Visit (INDEPENDENT_AMBULATORY_CARE_PROVIDER_SITE_OTHER): Payer: PPO | Admitting: Gastroenterology

## 2017-02-04 DIAGNOSIS — K2100 Gastro-esophageal reflux disease with esophagitis, without bleeding: Secondary | ICD-10-CM | POA: Insufficient documentation

## 2017-02-04 NOTE — Progress Notes (Signed)
CC'D TO PCP °

## 2017-02-04 NOTE — Assessment & Plan Note (Signed)
66 year old female with history of reflux esophagitis, globus, abnormal barium pill esophagram as outlined above. She has been doing very well on pantoprazole 40 mg twice daily. Suspect recent symptoms of fullness in her throat is related to grief/anxiety over the sudden death of her mother. She will continue to monitor the symptoms. Suspect they will get better. She's having no dysphagia or heartburn. She'll continue current regimen. She'll come back in 6 months for follow-up. At that time dropping pantoprazole once daily. Call sooner if needed.

## 2017-02-04 NOTE — Progress Notes (Signed)
Primary Care Physician: Alycia Rossetti, MD  Primary Gastroenterologist:  Garfield Cornea, MD   Chief Complaint  Patient presents with  . Dysphagia    HPI: Jill Roach is a 66 y.o. female here for follow-up of recent procedures. She had symptoms of globus and heartburn. BPE showed moderate to marked esophageal dysmotility with contrast stasis in the mid and upper esophagus but no stricture. 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 She also had evaluation by Dr. Johnathan Hausen back then including EGD which was normal as well as esophageal manometry with ambulatory 24-hour pH probe study. When her symptoms developed recently she saw her PCP who arranged for an ultrasound of her thyroid. Markedly heterogeneous and hypervascular thyroid parenchyma, right lobe measuring 3.7 x 1.7 x 1.6 cm, left lobe 4.2 x 1.2 X 1.7 cm (minimal change from 2010), stable left isthmic nodule, does not meet criteria for biopsy or follow-up. Normal-sized heterogeneous hyperemic gland. Really did not issue swallowing pills, fluids, food.  EGD revealed reflux esophagitis, grade B, esophagus was dilated due to history of globus.  Colonoscopy revealed scattered diverticulosis.  Patient is on pantoprazole 40 mg twice a day. She's been doing very well. Last week her mother had a stroke and subsequently died. She is [place] of [place]. She's been upset about this and noticed a little bit of fullness in her throat area again. She denies difficulty swallowing. Heartburn is well controlled. She believes is mostly related to her nerves. Denies abdominal pain. Bowel function is good. No melena rectal bleeding. She notes she's taking pantoprazole before lunch and one before supper. I requested that she moves the first dose for breakfast 2 more evenly space her medication.   Current Outpatient Prescriptions  Medication Sig Dispense Refill  . calcium carbonate (TUMS  - DOSED IN MG ELEMENTAL CALCIUM) 500 MG chewable tablet Chew 2-3 tablets by mouth 2 (two) times daily as needed for indigestion or heartburn.    . Calcium-Magnesium-Vitamin D (CALCIUM 1200+D3 PO) Take 1 tablet by mouth daily.    Marland Kitchen lisinopril-hydrochlorothiazide (PRINZIDE,ZESTORETIC) 20-12.5 MG tablet Take 1 tablet by mouth daily. 90 tablet 2  . naproxen sodium (ANAPROX) 220 MG tablet Take 220 mg by mouth daily as needed.     . Omega-3 Fatty Acids (FISH OIL) 1000 MG CAPS Take 1 capsule by mouth daily.    . pantoprazole (PROTONIX) 40 MG tablet Take 1 tablet (40 mg total) by mouth 2 (two) times daily before a meal. 60 tablet 5  . SYNTHROID 88 MCG tablet TAKE ONE TABLET BY MOUTH ONCE DAILY 150 tablet 0   No current facility-administered medications for this visit.     Allergies as of 02/04/2017 - Review Complete 02/04/2017  Allergen Reaction Noted  . Codeine Nausea Only 09/20/2015    ROS:  General: Negative for anorexia, weight loss, fever, chills, fatigue, weakness. ENT: Negative for hoarseness, difficulty swallowing , nasal congestion. CV: Negative for chest pain, angina, palpitations, dyspnea on exertion, peripheral edema.  Respiratory: Negative for dyspnea at rest, dyspnea on exertion, cough, sputum, wheezing.  GI: See history of present illness. GU:  Negative for dysuria, hematuria, urinary incontinence, urinary frequency, nocturnal urination.  Endo: Negative for unusual weight change.    Physical Examination:   BP 127/80   Pulse 77   Temp 98.3 F (36.8 C) (Oral)   Ht 5' 4.5" (1.638 m)   Wt 175 lb (79.4  kg)   BMI 29.57 kg/m   General: Well-nourished, well-developed in no acute distress.  Eyes: No icterus. Mouth: Oropharyngeal mucosa moist and pink , no lesions erythema or exudate. Lungs: Clear to auscultation bilaterally.  Heart: Regular rate and rhythm, no murmurs rubs or gallops.  Abdomen: Bowel sounds are normal, nontender, nondistended, no hepatosplenomegaly or masses,  no abdominal bruits or hernia , no rebound or guarding.   Extremities: No lower extremity edema. No clubbing or deformities. Neuro: Alert and oriented x 4   Skin: Warm and dry, no jaundice.   Psych: Alert and cooperative, normal mood and affect.  Labs:  Lab Results  Component Value Date   WBC 5.4 09/28/2016   HGB 13.7 09/28/2016   HCT 40.8 09/28/2016   MCV 85.9 09/28/2016   PLT 241 09/28/2016   Lab Results  Component Value Date   CREATININE 0.78 09/28/2016   BUN 15 09/28/2016   NA 141 09/28/2016   K 5.0 09/28/2016   CL 107 09/28/2016   CO2 25 09/28/2016   Lab Results  Component Value Date   ALT 15 09/28/2016   AST 14 09/28/2016   ALKPHOS 71 09/28/2016   BILITOT 0.5 09/28/2016    Imaging Studies: Mm Screening Breast Tomo Bilateral  Result Date: 01/26/2017 CLINICAL DATA:  Screening. EXAM: 2D DIGITAL SCREENING BILATERAL MAMMOGRAM WITH CAD AND ADJUNCT TOMO COMPARISON:  Previous exam(s). ACR Breast Density Category c: The breast tissue is heterogeneously dense, which may obscure small masses. FINDINGS: There are no findings suspicious for malignancy. Images were processed with CAD. IMPRESSION: No mammographic evidence of malignancy. A result letter of this screening mammogram will be mailed directly to the patient. RECOMMENDATION: Screening mammogram in one year. (Code:SM-B-01Y) BI-RADS CATEGORY  1: Negative. Electronically Signed   By: Lajean Manes M.D.   On: 01/26/2017 10:27

## 2017-02-04 NOTE — Patient Instructions (Signed)
1. You can take synthroid and pantoprazole at the same time on an empty stomach. So take pantoprazole before breakfast and before evening meal.  2. Return to the office in six months. If you continue to do well, we will consider decreasing pantoprazole to once daily.

## 2017-02-14 NOTE — Progress Notes (Signed)
REVIEWED-NO ADDITIONAL RECOMMENDATIONS. 

## 2017-04-13 ENCOUNTER — Other Ambulatory Visit: Payer: Self-pay | Admitting: Family Medicine

## 2017-05-04 ENCOUNTER — Other Ambulatory Visit: Payer: Self-pay | Admitting: Family Medicine

## 2017-05-04 ENCOUNTER — Ambulatory Visit (INDEPENDENT_AMBULATORY_CARE_PROVIDER_SITE_OTHER): Payer: PPO | Admitting: Family Medicine

## 2017-05-04 ENCOUNTER — Encounter: Payer: Self-pay | Admitting: Family Medicine

## 2017-05-04 VITALS — BP 128/68 | HR 80 | Temp 98.1°F | Resp 14 | Ht 64.5 in | Wt 178.0 lb

## 2017-05-04 DIAGNOSIS — L255 Unspecified contact dermatitis due to plants, except food: Secondary | ICD-10-CM | POA: Diagnosis not present

## 2017-05-04 MED ORDER — PREDNISONE 10 MG PO TABS: ORAL_TABLET | ORAL | 0 refills | Status: DC

## 2017-05-04 MED ORDER — METHYLPREDNISOLONE ACETATE 80 MG/ML IJ SUSP
80.0000 mg | Freq: Once | INTRAMUSCULAR | Status: AC
  Administered 2017-05-04: 80 mg via INTRAMUSCULAR

## 2017-05-04 NOTE — Progress Notes (Signed)
   Subjective:    Patient ID: Jill Roach, female    DOB: 23-Jan-1951, 66 y.o.   MRN: [id number]  Patient presents for Rash (x6 days- contact dermatitis to arms, legs, top of feet, breasts- has used prednisone from daughter)   Was out in flower bed came in contact with poison ivy last week, was busy and could not get in for a visit, she had a few tablets of prednisone She took 40mg  x 2 days and 20mg  x 1 day, using calagel , took benadryl last night but itching still very severe  no difficulty breathing, no cough  Its on her legs and arms/feet    Review Of Systems:  GEN- denies fatigue, fever, weight loss,weakness, recent illness HEENT- denies eye drainage, change in vision, nasal discharge, CVS- denies chest pain, palpitations RESP- denies SOB, cough, wheeze ABD- denies N/V, change in stools, abd pain GU- denies dysuria, hematuria, dribbling, incontinence MSK- denies joint pain, muscle aches, injury Neuro- denies headache, dizziness, syncope, seizure activity       Objective:    BP 128/68   Pulse 80   Temp 98.1 F (36.7 C) (Oral)   Resp 14   Ht 5' 4.5" (1.638 m)   Wt 178 lb (80.7 kg)   SpO2 98%   BMI 30.08 kg/m  GEN- NAD, alert and oriented x3 HEENT- PERRL, EOMI, non injected sclera, pink conjunctiva, MMM, oropharynx clear Neck- Supple, no LAD CVS- RRR, no murmur RESP-CTAB Skin- erythematous maculoparpular lesions, with scattered linear lesions on bilat arms, on right foot. No lesions on face, no oral lesions EXT- No edema Pulses- Radial 2+        Assessment & Plan:      Problem List Items Addressed This Visit    None    Visit Diagnoses    Dermatitis due to plants, including poison ivy, sumac, and oak    -  Primary   Treat with Depo Medrol 80mg , prednisone taper, benadryl at bedtime      Note: This dictation was prepared with Dragon dictation along with smaller phrase technology. Any transcriptional errors that result from this process are  unintentional.

## 2017-05-04 NOTE — Patient Instructions (Addendum)
Take a benadryl at bedtime  Start steroid taper in the morning  Schedule a flu shot in the next 1-2 weeks  F/U 3 months

## 2017-05-05 ENCOUNTER — Other Ambulatory Visit: Payer: Self-pay | Admitting: *Deleted

## 2017-05-05 MED ORDER — LEVOTHYROXINE SODIUM 88 MCG PO TABS: 88.0000 ug | ORAL_TABLET | Freq: Every day | ORAL | 1 refills | Status: DC

## 2017-07-07 ENCOUNTER — Encounter: Payer: Self-pay | Admitting: Family Medicine

## 2017-07-07 ENCOUNTER — Other Ambulatory Visit: Payer: Self-pay

## 2017-07-07 ENCOUNTER — Ambulatory Visit: Payer: PPO | Admitting: Family Medicine

## 2017-07-07 VITALS — BP 130/74 | HR 82 | Temp 98.2°F | Resp 16 | Ht 64.5 in | Wt 170.0 lb

## 2017-07-07 DIAGNOSIS — J0100 Acute maxillary sinusitis, unspecified: Secondary | ICD-10-CM

## 2017-07-07 DIAGNOSIS — J040 Acute laryngitis: Secondary | ICD-10-CM | POA: Diagnosis not present

## 2017-07-07 MED ORDER — AMOXICILLIN 875 MG PO TABS: 875.0000 mg | ORAL_TABLET | Freq: Two times a day (BID) | ORAL | 0 refills | Status: DC

## 2017-07-07 NOTE — Progress Notes (Signed)
   Subjective:    Patient ID: Jill Roach, female    DOB: 01/20/1951, 66 y.o.   MRN: [id number]  Patient presents for Illness (x4 days- nasal drainage, nasal congestion, sinus pressure, congestion to throat, nonproductive cough- denies fever)  Started with head cold and allergies and now her persistant drainage for past 4-5 days. Some mild productive cough. No fever, chills. No other OTC meds taken except anti-histamine    Review Of Systems:  GEN- denies fatigue, fever, weight loss,weakness, recent illness HEENT- denies eye drainage, change in vision, +nasal discharge, CVS- denies chest pain, palpitations RESP- denies SOB, +cough, wheeze ABD- denies N/V, change in stools, abd pain GU- denies dysuria, hematuria, dribbling, incontinence MSK- denies joint pain, muscle aches, injury Neuro- denies headache, dizziness, syncope, seizure activity       Objective:    BP 130/74   Pulse 82   Temp 98.2 F (36.8 C) (Oral)   Resp 16   Ht 5' 4.5" (1.638 m)   Wt 170 lb (77.1 kg)   SpO2 98%   BMI 28.73 kg/m  GEN- NAD, alert and oriented x3 HEENT- PERRL, EOMI, non injected sclera, pink conjunctiva, MMM, oropharynx mild injection, TM clear bilat no effusion,  Hoarse voice  + maxillary sinus tenderness, inflammed turbinates,  Nasal drainage  Neck- Supple, shotty LAD CVS- RRR, no murmur RESP-CTAB EXT- No edema Pulses- Radial 2+          Assessment & Plan:      Problem List Items Addressed This Visit    None    Visit Diagnoses    Acute maxillary sinusitis, recurrence not specified    -  Primary   amoxicllin, nasacort, nasal saline, throat lozenge   Relevant Medications   amoxicillin (AMOXIL) 875 MG tablet   Acute laryngitis          Note: This dictation was prepared with Dragon dictation along with smaller phrase technology. Any transcriptional errors that result from this process are unintentional.

## 2017-07-07 NOTE — Patient Instructions (Signed)
Take delsym for cough Take antibiotics Use the nasal spray

## 2017-07-13 ENCOUNTER — Telehealth: Payer: Self-pay | Admitting: *Deleted

## 2017-07-13 NOTE — Telephone Encounter (Signed)
Received call from patient.   Reports that she has completed ABTx for sinus infection, but she does not feel that it was effective.   Call placed to patient to inquire. [place].

## 2017-07-14 MED ORDER — PREDNISONE 20 MG PO TABS: ORAL_TABLET | ORAL | 0 refills | Status: DC

## 2017-07-14 NOTE — Telephone Encounter (Signed)
Prescription sent to pharmacy. .   Call placed to patient and patient made aware.  

## 2017-07-14 NOTE — Telephone Encounter (Signed)
Use nasal spray, saline or flonase Send prednisone taper You can send tessalon perrles

## 2017-07-14 NOTE — Telephone Encounter (Signed)
Call placed to patient to inquire.   Reports that Sx have improved, but she continues to have productive cough, nasal drainage, and still feels "croupy" in throat. Patient noted very hoarse. Denies fever.   MD please advise.

## 2017-07-26 ENCOUNTER — Telehealth: Payer: Self-pay | Admitting: *Deleted

## 2017-07-26 MED ORDER — AZITHROMYCIN 250 MG PO TABS: ORAL_TABLET | ORAL | 0 refills | Status: DC

## 2017-07-26 NOTE — Telephone Encounter (Signed)
Received call from patient.   Reports that she is still sick and thinks she needs Z-Pack.  States that she has completed steroids, but she feels worse than before. Reports nasal drainage, sinus pressure, HA.   MD please advise.

## 2017-07-26 NOTE — Telephone Encounter (Signed)
If cough has improved and only sinus symptoms Okay to try the zpak If not improved needs OV

## 2017-07-26 NOTE — Telephone Encounter (Signed)
Call placed to patient and patient made aware.   Prescription sent to pharmacy.  

## 2017-08-11 ENCOUNTER — Encounter: Payer: Self-pay | Admitting: Gastroenterology

## 2017-08-11 ENCOUNTER — Ambulatory Visit: Payer: PPO | Admitting: Gastroenterology

## 2017-08-11 VITALS — BP 141/77 | HR 78 | Temp 97.2°F | Ht 64.0 in | Wt 173.8 lb

## 2017-08-11 DIAGNOSIS — K2100 Gastro-esophageal reflux disease with esophagitis, without bleeding: Secondary | ICD-10-CM

## 2017-08-11 NOTE — Patient Instructions (Addendum)
  1. Once you are feeling better from bronchitis standpoint, you can try weaning off pantoprazole, cut back to every other day for 2 weeks, then 3 times weekly for 2 weeks, then 2 times weekly for 2 weeks, then off. IF YOU HAVE RECURRENT FREQUENT HEARTBURN OR SWALLOWING DIFFICULTY, OR FEELING OF LUMP IN YOUR THROAT, I WOULD ADVISE YOU GO BACK ON DAILY PANTOPRAZOLE. 2. See you back in one year or call sooner if needed.

## 2017-08-11 NOTE — Assessment & Plan Note (Signed)
Mild globus since dealing with bronchitis. Has been on two rounds of antibiotics and prednisone.  Starting to feel better.  Prior to bronchitis, she was doing very well from a reflux standpoint.  No globus or difficulty swallowing.  She does have multiple concerns about chronic pantoprazole use.  Given that she did have history of typical reflux symptoms, would allow her to attempt to wean off of pantoprazole once she is feeling a lot better.  If at any time she has recurrent heartburn, globus, dysphagia would advise her to continue daily pantoprazole.  Return to the office in one year or call sooner if needed.

## 2017-08-11 NOTE — Progress Notes (Signed)
      Primary Care Physician: Alycia Rossetti, MD  Primary Gastroenterologist:  Garfield Cornea, MD   Chief Complaint  Patient presents with  . Gastroesophageal Reflux    f/u; doing ok    HPI: Jill Roach is a 67 y.o. female here for follow-up of GERD.  EGD and colonoscopy back in March 2009.  She had reflux esophagitis, grade B, esophagus was dilated due to history of globus.  Scattered diverticulosis seen.  Next colonoscopy planned March 2028.  Clinically she had been doing very well up until recently when she developed bronchitis.  He has been on 2 rounds of antibiotics, prednisone.  No typical heartburn or dysphagia but has had some globus sensation.  Reassured her that this is likely due to congestion but it should go away.  She denies abdominal pain.  Bowel movements are regular.  No blood in the stool or melena.  Weight has been stable the last 6 months.  Current Outpatient Medications  Medication Sig Dispense Refill  . Calcium-Magnesium-Vitamin D (CALCIUM 1200+D3 PO) Take 1 tablet by mouth daily.    Marland Kitchen levothyroxine (SYNTHROID, LEVOTHROID) 88 MCG tablet Take 1 tablet (88 mcg total) by mouth daily. 90 tablet 1  . lisinopril-hydrochlorothiazide (PRINZIDE,ZESTORETIC) 20-12.5 MG tablet TAKE ONE TABLET BY MOUTH ONCE DAILY 90 tablet 2  . Omega-3 Fatty Acids (FISH OIL) 1000 MG CAPS Take 1 capsule by mouth daily.    . pantoprazole (PROTONIX) 40 MG tablet Take 1 tablet (40 mg total) by mouth 2 (two) times daily before a meal. (Patient taking differently: Take 40 mg by mouth daily. ) 60 tablet 5   No current facility-administered medications for this visit.     Allergies as of 08/11/2017 - Review Complete 08/11/2017  Allergen Reaction Noted  . Codeine Nausea Only 09/20/2015    ROS:  General: Negative for anorexia, weight loss, fever, chills, fatigue, weakness. ENT: Negative for hoarseness, difficulty swallowing , nasal congestion. CV: Negative for chest pain, angina,  palpitations, dyspnea on exertion, peripheral edema.  Respiratory: Negative for dyspnea at rest, dyspnea on exertion, cough, sputum, wheezing.  GI: See history of present illness. GU:  Negative for dysuria, hematuria, urinary incontinence, urinary frequency, nocturnal urination.  Endo: Negative for unusual weight change.    Physical Examination:   BP (!) 141/77   Pulse 78   Temp (!) 97.2 F (36.2 C) (Oral)   Ht 5\' 4"  (1.626 m)   Wt 173 lb 12.8 oz (78.8 kg)   BMI 29.83 kg/m   General: Well-nourished, well-developed in no acute distress.  Eyes: No icterus. Mouth: Oropharyngeal mucosa moist and pink , no lesions erythema or exudate. Lungs: Clear to auscultation bilaterally.  Heart: Regular rate and rhythm, no murmurs rubs or gallops.  Abdomen: Bowel sounds are normal, nontender, nondistended, no hepatosplenomegaly or masses, no abdominal bruits or hernia , no rebound or guarding.   Extremities: No lower extremity edema. No clubbing or deformities. Neuro: Alert and oriented x 4   Skin: Warm and dry, no jaundice.   Psych: Alert and cooperative, normal mood and affect.

## 2017-08-11 NOTE — Progress Notes (Signed)
CC'ED TO PCP 

## 2017-09-15 ENCOUNTER — Telehealth: Payer: Self-pay | Admitting: Internal Medicine

## 2017-09-15 NOTE — Telephone Encounter (Signed)
Spoke with pt, pt is on her last week trying to phase pantoprazole out. Pt got heart burn and some diarrhea after eating some greasy foods. Pt and I discussed the AVS given 08/2017 and it states that if pt gets heartburn, pt should go back to taking Pantoprazole daily. Pt doesn't like with side effects that she read about and wants to know if there is another medication that has less  side effects? Pt is concerned with hearing pts can get Dementia.

## 2017-09-15 NOTE — Telephone Encounter (Signed)
PLEASE CALL PATIENT   [phone]  SHE L/M STATING THAT SHE WANTS TO GET OFF PANTOPROZOLE AND WANTS TO KNOW WHAT OTHER OPTIONS SHE HAS

## 2017-09-15 NOTE — Telephone Encounter (Signed)
Tried calling pt, VM is full, not able to leave a message.

## 2017-09-18 NOTE — Telephone Encounter (Signed)
There really is no proof that PPIs cause dementia. Please let patient know that I will be glad to send a copy of an article that may help alleviate her concerns.   If she wants to get off PPI, she will need to follow strict antireflux measures and diet. Can always use TUMS, Pepcid, Zantac etc as needed.   Please send her GERD diet.

## 2017-09-20 NOTE — Telephone Encounter (Signed)
Tried calling pt, VM is full, not able to leave a message. Will call pt back.

## 2017-09-23 NOTE — Telephone Encounter (Signed)
Pt would like the article information discussed in the last note. I will get Jerrye Bushy info ready for pt as well. At this time, pt is taking the Pantoprazole daily and states she may possible take it qod.

## 2017-09-23 NOTE — Telephone Encounter (Signed)
Jill Roach info and Article was put in the mail for pt today.

## 2017-11-17 DIAGNOSIS — L820 Inflamed seborrheic keratosis: Secondary | ICD-10-CM | POA: Diagnosis not present

## 2017-11-17 DIAGNOSIS — B078 Other viral warts: Secondary | ICD-10-CM | POA: Diagnosis not present

## 2017-11-29 ENCOUNTER — Ambulatory Visit (INDEPENDENT_AMBULATORY_CARE_PROVIDER_SITE_OTHER): Payer: PPO | Admitting: Family Medicine

## 2017-11-29 ENCOUNTER — Other Ambulatory Visit: Payer: Self-pay

## 2017-11-29 ENCOUNTER — Encounter: Payer: Self-pay | Admitting: Family Medicine

## 2017-11-29 VITALS — BP 132/82 | HR 78 | Temp 98.3°F | Resp 14 | Ht 64.0 in | Wt 182.0 lb

## 2017-11-29 DIAGNOSIS — J040 Acute laryngitis: Secondary | ICD-10-CM | POA: Diagnosis not present

## 2017-11-29 DIAGNOSIS — J309 Allergic rhinitis, unspecified: Secondary | ICD-10-CM

## 2017-11-29 DIAGNOSIS — J029 Acute pharyngitis, unspecified: Secondary | ICD-10-CM | POA: Diagnosis not present

## 2017-11-29 MED ORDER — MONTELUKAST SODIUM 10 MG PO TABS: 10.0000 mg | ORAL_TABLET | Freq: Every day | ORAL | 3 refills | Status: DC

## 2017-11-29 MED ORDER — FLUTICASONE PROPIONATE 50 MCG/ACT NA SUSP: 2.0000 | Freq: Every day | NASAL | 6 refills | Status: DC

## 2017-11-29 NOTE — Progress Notes (Signed)
Patient ID: Jill Roach, female    DOB: January 09, 1951, 67 y.o.   MRN: [id number]  PCP: Alycia Rossetti, MD  Chief Complaint  Patient presents with  . Illness    x1 day- post nasal drip, semi-productive cough, scratchy throat    Subjective:   Jill Roach is a 67 y.o. female, presents to clinic with CC of postnasal drip, nasal drainage, scratchy throat and "congestion in throat" that began last night.  She losing her voice today and feels generally unwell.  Her symptoms began yesterday after being outside when she began sneezing and itching.  Throat is irritated and scratchy but she denies any focal pain gets 0-10 pain scale.  She denies coughing, shortness of breath, wheezing.  Also no fevers, chills, sweats, nausea, vomiting, diarrhea, headache, sinus pain or pressure.    Pt reports a history of allergies but is currently not taking any medication.  In the past she had such severe allergies that she had to go to an allergist and receives shots weekly. She has no sick contacts.  She states that she has no history of lung disease, no smoking history, no asthma or COPD.  Father had a history of pulmonary fibrosis.    She notes that when she usually has these symptoms and always takes a Z-Pak to make her feel better.   In such case instances she states that the symptoms usually progresses have been much worse such as bronchitis.  Also when in the past treated initially with amoxicillin she states that she still does not improve and still needs a Z-Pak to make her feel better.  She denies any reflux symptoms. She has not tried any home remedies or over-the-counter treatments. Nothing else makes it better or worse.    Patient Active Problem List   Diagnosis Date Noted  . Reflux esophagitis 02/04/2017  . Colon cancer screening 10/05/2016  . Globus sensation 10/05/2016  . Osteopenia 09/28/2016  . Obesity 03/24/2016  . Chronic insomnia 03/24/2016  . Arthritis 09/25/2015  .  Hypertension   . Hyperlipidemia   . Hypothyroidism      Prior to Admission medications   Medication Sig Start Date End Date Taking? Authorizing Provider  Calcium-Magnesium-Vitamin D (CALCIUM 1200+D3 PO) Take 1 tablet by mouth daily.   Yes [provider]  levothyroxine (SYNTHROID, LEVOTHROID) 88 MCG tablet Take 1 tablet (88 mcg total) by mouth daily. 05/05/17  Yes [place], Modena Nunnery, MD  lisinopril-hydrochlorothiazide (PRINZIDE,ZESTORETIC) 20-12.5 MG tablet TAKE ONE TABLET BY MOUTH ONCE DAILY 05/04/17  Yes [place], Modena Nunnery, MD  Omega 3 1200 MG CAPS Take by mouth.   Yes [provider]  pantoprazole (PROTONIX) 40 MG tablet Take 40 mg by mouth daily.   Yes [provider]     Allergies  Allergen Reactions  . Codeine Nausea Only     Family History  Problem Relation Age of Onset  . Hypertension Mother   . Arthritis Father        Rheumatoid   . Diabetes Father   . Hypertension Father   . Thyroid disease Father   . Arthritis Brother        Rheumatoid  . Thyroid disease Brother   . Heart disease Maternal Grandmother        stomach cancer  . Hypertension Maternal Grandmother   . Cancer Paternal Grandmother        LEUKEMIA/LUNG CNACER,  . Thyroid disease Paternal Grandmother   . Hypertension Paternal Grandmother   .  Cancer Other   . Mental illness Other   . Cancer Maternal Uncle   . Thyroid disease Paternal Aunt   . Colon cancer Neg Hx      Social History   Socioeconomic History  . Marital status: Divorced    Spouse name: Not on file  . Number of children: Not on file  . Years of education: Not on file  . Highest education level: Not on file  Occupational History  . Not on file  Social Needs  . Financial resource strain: Not on file  . Food insecurity:    Worry: Not on file    Inability: Not on file  . Transportation needs:    Medical: Not on file    Non-medical: Not on file  Tobacco Use  . Smoking status: Never Smoker  . Smokeless  tobacco: Never Used  Substance and Sexual Activity  . Alcohol use: No  . Drug use: No  . Sexual activity: Not Currently  Lifestyle  . Physical activity:    Days per week: Not on file    Minutes per session: Not on file  . Stress: Not on file  Relationships  . Social connections:    Talks on phone: Not on file    Gets together: Not on file    Attends religious service: Not on file    Active member of club or organization: Not on file    Attends meetings of clubs or organizations: Not on file    Relationship status: Not on file  . Intimate partner violence:    Fear of current or ex partner: Not on file    Emotionally abused: Not on file    Physically abused: Not on file    Forced sexual activity: Not on file  Other Topics Concern  . Not on file  Social History Narrative  . Not on file     Review of Systems  Constitutional: Negative.  Negative for activity change, appetite change, chills, diaphoresis, fatigue, fever and unexpected weight change.  HENT: Positive for congestion, postnasal drip, rhinorrhea, sneezing, sore throat and voice change. Negative for dental problem, drooling, ear discharge, ear pain, facial swelling, hearing loss, mouth sores, nosebleeds, sinus pressure, sinus pain, tinnitus and trouble swallowing.   Eyes: Negative.   Respiratory: Negative.  Negative for cough, chest tightness, shortness of breath, wheezing and stridor.   Cardiovascular: Negative.  Negative for chest pain and palpitations.  Gastrointestinal: Negative.  Negative for abdominal pain, constipation, diarrhea, nausea and vomiting.  Endocrine: Negative.   Genitourinary: Negative.   Musculoskeletal: Negative.  Negative for myalgias.  Skin: Negative.  Negative for color change, pallor and rash.  Allergic/Immunologic: Positive for environmental allergies. Negative for food allergies and immunocompromised state.  Neurological: Negative.  Negative for light-headedness and headaches.  Hematological:  Negative.   Psychiatric/Behavioral: Negative.   All other systems reviewed and are negative.      Objective:    Vitals:   11/29/17 1115  BP: 132/82  Pulse: 78  Resp: 14  Temp: 98.3 F (36.8 C)  TempSrc: Oral  SpO2: 98%  Weight: 182 lb (82.6 kg)  Height: 5\' 4"  (1.626 m)      Physical Exam  Constitutional: She appears well-developed and well-nourished.  Non-toxic appearance. No distress.  HENT:  Head: Normocephalic and atraumatic.  Right Ear: External ear normal.  Left Ear: External ear normal.  Nose: Nose normal.  Mouth/Throat: Uvula is midline, oropharynx is clear and moist and mucous membranes are normal.  Bilateral  TMs normal No sinus tenderness to palpation to frontal or bilateral maxillary sinuses Nasal turbinates boggy and pale, mild clear discharge Posterior oropharynx mildly erythematous, no edema, no exudate, tonsils not visualized, uvula midline Raspy voice  Eyes: Pupils are equal, round, and reactive to light. Conjunctivae, EOM and lids are normal. Right eye exhibits no discharge. Left eye exhibits no discharge. No scleral icterus.  Neck: Normal range of motion and phonation normal. Neck supple. No tracheal deviation present. No thyromegaly present.  Cardiovascular: Normal rate, regular rhythm, normal heart sounds and normal pulses. Exam reveals no gallop and no friction rub.  No murmur heard. Pulses:      Radial pulses are 2+ on the right side, and 2+ on the left side.       Posterior tibial pulses are 2+ on the right side, and 2+ on the left side.  Pulmonary/Chest: Effort normal and breath sounds normal. No stridor. No respiratory distress. She has no wheezes. She has no rhonchi. She has no rales. She exhibits no tenderness.  Abdominal: Soft. Normal appearance and bowel sounds are normal. She exhibits no distension. There is no tenderness. There is no rebound and no guarding.  Musculoskeletal: Normal range of motion. She exhibits no edema or deformity.    Lymphadenopathy:    She has no cervical adenopathy.  Neurological: She is alert. No sensory deficit. She exhibits normal muscle tone. Coordination and gait normal.  Skin: Skin is warm, dry and intact. Capillary refill takes less than 2 seconds. No rash noted. She is not diaphoretic. No erythema. No pallor.  Psychiatric: She has a normal mood and affect. Her speech is normal and behavior is normal.  Nursing note and vitals reviewed.         Assessment & Plan:      ICD-10-CM   1. Pharyngitis, unspecified etiology J02.9 STREP GROUP A AG, W/REFLEX TO CULT  2. Allergic rhinitis, unspecified seasonality, unspecified trigger J30.9   3. Laryngitis J04.0     Patient presents with symptoms that began last night, vital signs stable within normal limits, afebrile, physical exam pertinent for mild posterior oropharynx erythema, nasal mucosa appears consistent with allergic rhinitis.  Her symptoms began after being outside yesterday, she has history of severe allergies, currently untreated, suspect that most of this is allergic in nature and postnasal drip has caused some pharyngitis and laryngitis.  No indication for antibiotics at this time.  She is very adamant that Z-Pak is the only thing that improves her symptoms however I do not feel this is indicated and feel that there would only be potential for medication side effects without any benefit and I have explained this to her at length.  Instead would like to start treating allergies.  Even with URI or bronchitis have explained to her that these are viral syndrome which are self-limiting, and often can last 1 to 3 weeks.  Plan is to treat allergies, support body with over-the-counter medicines as needed, increase fluids, rest etc.  I also plan to her that allergy control medications often take several days up to 1 week to see the benefit of.    Patient was instructed to follow-up in office or call if she has signs or symptoms of a bacterial infection  such as acute bacterial sinusitis, facial pain/sinus pain/pressure, double worsening with fever, which may need antibiotics.  Also described bronchitis as worsening productive cough often with wheeze, also described how this is viral in nature and could be treated with expectorants, steroids, albuterol.  Patient verbalized understanding was reluctantly agreeable to plan.       Delsa Grana, PA-C 11/29/17 11:26 AM

## 2017-11-29 NOTE — Patient Instructions (Addendum)
Start with allergy meds to stop some of the post nasal drip which leads to scratchy throat and coughing.    Let me know if your medicines are too expensive at the pharmacy  Laryngitis, sore throat and nasal symptoms usually take 1-3 weeks to resolve.  I do not feel like you currently need antibiotics.    We are sending off a throat culture to make sure no bacteria is growing that needs antibiotics for.  If you develop fevers with severe facial pain, or new productive cough with wheeze, shortness of breath and fever, please call or return for recheck, to see if you require antibiotics.    Allergies, Adult An allergy is when your body's defense system (immune system) overreacts to an otherwise harmless substance (allergen) that you breathe in or eat or something that touches your skin. When you come into contact with something that you are allergic to, your immune system produces certain proteins (antibodies). These proteins cause cells to release chemicals (histamines) that trigger the symptoms of an allergic reaction. Allergies often affect the nasal passages (allergic rhinitis), eyes (allergic conjunctivitis), skin (atopic dermatitis), and stomach. Allergies can be mild or severe. Allergies cannot spread from person to person (are not contagious). They can develop at any age and may be outgrown. What increases the risk? You may be at greater risk of allergies if other people in your family have allergies. What are the signs or symptoms? Symptoms depend on what type of allergy you have. They may include:  Runny, stuffy nose.  Sneezing.  Itchy mouth, ears, or throat.  Postnasal drip.  Sore throat.  Itchy, red, watery, or puffy eyes.  Skin rash or hives.  Stomach pain.  Vomiting.  Diarrhea.  Bloating.  Wheezing or coughing.  People with a severe allergy to food, medicine, or an insect bite may have a life-threatening allergic reaction (anaphylaxis). Symptoms of anaphylaxis  include:  Hives.  Itching.  Flushed face.  Swollen lips, tongue, or mouth.  Tight or swollen throat.  Chest pain or tightness in the chest.  Trouble breathing or shortness of breath.  Rapid heartbeat.  Dizziness or fainting.  Vomiting.  Diarrhea.  Pain in the abdomen.  How is this diagnosed? This condition is diagnosed based on:  Your symptoms.  Your family and medical history.  A physical exam.  You may need to see a health care provider who specializes in treating allergies (allergist). You may also have tests, including:  Skin tests to see which allergens are causing your symptoms, such as: ? Skin prick test. In this test, your skin is pricked with a tiny needle and exposed to small amounts of possible allergens to see if your skin reacts. ? Intradermal skin test. In this test, a small amount of allergen is injected under your skin to see if your skin reacts. ? Patch test. In this test, a small amount of allergen is placed on your skin and then your skin is covered with a bandage. Your health care provider will check your skin after a couple of days to see if a rash has developed.  Blood tests.  Challenges tests. In this test, you inhale a small amount of allergen by mouth to see if you have an allergic reaction.  You may also be asked to:  Keep a food diary. A food diary is a record of all the foods and drinks you have in a day and any symptoms you experience.  Practice an elimination diet. An elimination diet  involves eliminating specific foods from your diet and then adding them back in one by one to find out if a certain food causes an allergic reaction.  How is this treated? Treatment for allergies depends on your symptoms. Treatment may include:  Cold compresses to soothe itching and swelling.  Eye drops.  Nasal sprays.  Using a saline spray or container (neti pot) to flush out the nose (nasal irrigation). These methods can help clear away mucus  and keep the nasal passages moist.  Using a humidifier.  Oral antihistamines or other medicines to block allergic reaction and inflammation.  Skin creams to treat rashes or itching.  Diet changes to eliminate food allergy triggers.  Repeated exposure to tiny amounts of allergens to build up a tolerance and prevent future allergic reactions (immunotherapy). These include: ? Allergy shots. ? Oral treatment. This involves taking small doses of an allergen under the tongue (sublingual immunotherapy).  Emergency epinephrine injection (auto-injector) in case of an allergic emergency. This is a self-injectable, pre-measured medicine that must be given within the first few minutes of a serious allergic reaction.  Follow these instructions at home:  Avoid known allergens whenever possible.  If you suffer from airborne allergens, wash out your nose daily. You can do this with a saline spray or a neti pot to flush out your nose (nasal irrigation).  Take over-the-counter and prescription medicines only as told by your health care provider.  Keep all follow-up visits as told by your health care provider. This is important.  If you are at risk of a severe allergic reaction (anaphylaxis), keep your auto-injector with you at all times.  If you have ever had anaphylaxis, wear a medical alert bracelet or necklace that states you have a severe allergy. Contact a health care provider if:  Your symptoms do not improve with treatment. Get help right away if:  You have symptoms of anaphylaxis, such as: ? Swollen mouth, tongue, or throat. ? Pain or tightness in your chest. ? Trouble breathing or shortness of breath. ? Dizziness or fainting. ? Severe abdominal pain, vomiting, or diarrhea. This information is not intended to replace advice given to you by your health care provider. Make sure you discuss any questions you have with your health care provider. Document Released: 10/20/2002 Document  Revised: 11/25/2016 Document Reviewed: 02/12/2016 Elsevier Interactive Patient Education  2018 [employer].   Allergic Rhinitis, Adult Allergic rhinitis is an allergic reaction that affects the mucous membrane inside the nose. It causes sneezing, a runny or stuffy nose, and the feeling of mucus going down the back of the throat (postnasal drip). Allergic rhinitis can be mild to severe. There are two types of allergic rhinitis:  Seasonal. This type is also called hay fever. It happens only during certain seasons.  Perennial. This type can happen at any time of the year.  What are the causes? This condition happens when the body's defense system (immune system) responds to certain harmless substances called allergens as though they were germs.  Seasonal allergic rhinitis is triggered by pollen, which can come from grasses, trees, and weeds. Perennial allergic rhinitis may be caused by:  House dust mites.  Pet dander.  Mold spores.  What are the signs or symptoms? Symptoms of this condition include:  Sneezing.  Runny or stuffy nose (nasal congestion).  Postnasal drip.  Itchy nose.  Tearing of the eyes.  Trouble sleeping.  Daytime sleepiness.  How is this diagnosed? This condition may be diagnosed based on:  Your medical history.  A physical exam.  Tests to check for related conditions, such as: ? Asthma. ? Pink eye. ? Ear infection. ? Upper respiratory infection.  Tests to find out which allergens trigger your symptoms. These may include skin or blood tests.  How is this treated? There is no cure for this condition, but treatment can help control symptoms. Treatment may include:  Taking medicines that block allergy symptoms, such as antihistamines. Medicine may be given as a shot, nasal spray, or pill.  Avoiding the allergen.  Desensitization. This treatment involves getting ongoing shots until your body becomes less sensitive to the allergen. This  treatment may be done if other treatments do not help.  If taking medicine and avoiding the allergen does not work, new, stronger medicines may be prescribed.  Follow these instructions at home:  Find out what you are allergic to. Common allergens include smoke, dust, and pollen.  Avoid the things you are allergic to. These are some things you can do to help avoid allergens: ? Replace carpet with wood, tile, or vinyl flooring. Carpet can trap dander and dust. ? Do not smoke. Do not allow smoking in your home. ? Change your heating and air conditioning filter at least once a month. ? During allergy season:  Keep windows closed as much as possible.  Plan outdoor activities when pollen counts are lowest. This is usually during the evening hours.  When coming indoors, change clothing and shower before sitting on furniture or bedding.  Take over-the-counter and prescription medicines only as told by your health care provider.  Keep all follow-up visits as told by your health care provider. This is important. Contact a health care provider if:  You have a fever.  You develop a persistent cough.  You make whistling sounds when you breathe (you wheeze).  Your symptoms interfere with your normal daily activities. Get help right away if:  You have shortness of breath. Summary  This condition can be managed by taking medicines as directed and avoiding allergens.  Contact your health care provider if you develop a persistent cough or fever.  During allergy season, keep windows closed as much as possible. This information is not intended to replace advice given to you by your health care provider. Make sure you discuss any questions you have with your health care provider. Document Released: 04/21/2001 Document Revised: 09/03/2016 Document Reviewed: 09/03/2016 Elsevier Interactive Patient Education  [employer].

## 2017-12-01 LAB — CULTURE, GROUP A STREP
MICRO NUMBER:: 90489120
SOURCE:: 0
SPECIMEN QUALITY:: ADEQUATE

## 2017-12-01 LAB — STREP GROUP A AG, W/REFLEX TO CULT: Streptococcus, Group A Screen (Direct): NOT DETECTED

## 2017-12-01 NOTE — Progress Notes (Signed)
Throat culture was negative, you do not need antibiotics

## 2017-12-27 ENCOUNTER — Ambulatory Visit (INDEPENDENT_AMBULATORY_CARE_PROVIDER_SITE_OTHER): Payer: PPO | Admitting: Physician Assistant

## 2017-12-27 ENCOUNTER — Encounter: Payer: Self-pay | Admitting: Physician Assistant

## 2017-12-27 ENCOUNTER — Other Ambulatory Visit: Payer: Self-pay

## 2017-12-27 VITALS — BP 142/86 | HR 83 | Temp 98.7°F | Resp 16 | Ht 64.0 in | Wt 183.2 lb

## 2017-12-27 DIAGNOSIS — B9689 Other specified bacterial agents as the cause of diseases classified elsewhere: Secondary | ICD-10-CM

## 2017-12-27 DIAGNOSIS — J988 Other specified respiratory disorders: Secondary | ICD-10-CM | POA: Diagnosis not present

## 2017-12-27 MED ORDER — AZITHROMYCIN 250 MG PO TABS: ORAL_TABLET | ORAL | 0 refills | Status: DC

## 2017-12-27 NOTE — Progress Notes (Signed)
Patient ID: Jill Roach MRN: [id number], DOB: 12-05-1950, 67 y.o. Date of Encounter: 12/27/2017, 10:18 AM    Chief Complaint:  Chief Complaint  Patient presents with  . Nasal Congestion  . Cough  . left ear pain     HPI: 67 y.o. year old female presents with above.    Today I have reviewed her note from 11/29/2017.  Today patient reports that at that time that she was to take medications for allergies so she has been using allergy medicines since then.  Reports that symptoms had improved and were controlled with the allergy medication.  Dates that she had several days of feeling good without any symptoms.  States that then last Wednesday she started to develop some recurrent symptoms and that symptoms worsened over the weekend.  Points to left maxillary sinus region and states that now she is having some discomfort there and also towards her left ear.  Also has a lot of chest congestion.  That from her nose she is just getting out a little mucus.  However is coughing up some thick dark phlegm.  She does have cough during the visit that does sound like congested cough.  She has had no fevers or chills.  No sore throat.     Home Meds:   Outpatient Medications Prior to Visit  Medication Sig Dispense Refill  . Calcium-Magnesium-Vitamin D (CALCIUM 1200+D3 PO) Take 1 tablet by mouth daily.    . fluticasone (FLONASE) 50 MCG/ACT nasal spray Place 2 sprays into both nostrils daily. 16 g 6  . levothyroxine (SYNTHROID, LEVOTHROID) 88 MCG tablet Take 1 tablet (88 mcg total) by mouth daily. 90 tablet 1  . lisinopril-hydrochlorothiazide (PRINZIDE,ZESTORETIC) 20-12.5 MG tablet TAKE ONE TABLET BY MOUTH ONCE DAILY 90 tablet 2  . montelukast (SINGULAIR) 10 MG tablet Take 1 tablet (10 mg total) by mouth at bedtime. 30 tablet 3  . Omega 3 1200 MG CAPS Take by mouth.    . pantoprazole (PROTONIX) 40 MG tablet Take 40 mg by mouth daily.     No facility-administered medications prior to visit.      Allergies:  Allergies  Allergen Reactions  . Codeine Nausea Only      Review of Systems: See HPI for pertinent ROS. All other ROS negative.    Physical Exam: Blood pressure (!) 142/86, pulse 83, temperature 98.7 F (37.1 C), temperature source Oral, resp. rate 16, height 5\' 4"  (1.626 m), weight 83.1 kg (183 lb 3.2 oz), SpO2 96 %., Body mass index is 31.45 kg/m. General:  WNWD WF. Appears in no acute distress. HEENT: Normocephalic, atraumatic, eyes without discharge, sclera non-icteric, nares are without discharge. Bilateral auditory canals clear, TM's are without perforation, pearly grey and translucent with reflective cone of light bilaterally. Oral cavity moist, posterior pharynx without exudate, erythema, peritonsillar abscess.  There is no tenderness with percussion to frontal or maxillary sinuses bilaterally.  Neck: Supple. No thyromegaly. No lymphadenopathy. Lungs: Clear bilaterally to auscultation without wheezes, rales, or rhonchi. Breathing is unlabored. Heart: Regular rhythm. No murmurs, rubs, or gallops. Msk:  Strength and tone normal for age. Extremities/Skin: Warm and dry.  Neuro: Alert and oriented X 3. Moves all extremities spontaneously. Gait is normal. CNII-XII grossly in tact. Psych:  Responds to questions appropriately with a normal affect.     ASSESSMENT AND PLAN:  67 y.o. year old female with  1. Bacterial respiratory infection On exam her lungs are clear.  However she does have congested cough throughout visit.  At this time we will add azithromycin.  She is to take this as directed.  Follow-up if symptoms do not resolve within 1 week after completion of antibiotic. - azithromycin (ZITHROMAX) 250 MG tablet; Day 1: Take 2 daily.  Days 2 -5: Take 1 daily.  Dispense: 6 tablet; Refill: 0   Signed, [address] [place], [place], [facility] 12/27/2017 10:18 AM

## 2017-12-31 ENCOUNTER — Other Ambulatory Visit: Payer: Self-pay | Admitting: Gastroenterology

## 2017-12-31 ENCOUNTER — Telehealth: Payer: Self-pay

## 2017-12-31 MED ORDER — LEVOFLOXACIN 750 MG PO TABS: 750.0000 mg | ORAL_TABLET | Freq: Every day | ORAL | 0 refills | Status: DC

## 2017-12-31 NOTE — Telephone Encounter (Signed)
Patient called and is complaining of still having congestion she was in office on 12/27/2017. Patient was advised she could use Mucinex to help with congestion.Patient states she will be going out of town and did not want to have to deal with the congestion and that in times past it took two rounds of antibiotics for her symptoms to improve. Pls advise

## 2017-12-31 NOTE — Telephone Encounter (Signed)
At OV 12/27/2017---Rxed Azithromycin.  Tell her that at this time--- I will Rx a much stronger antibiotic--that should definitely kill off infection.  Levaquin 750mg  1 po QD x 7 days # 7 + 0

## 2017-12-31 NOTE — Telephone Encounter (Signed)
Call placed to patient she is aware rx will be sent in

## 2018-01-14 ENCOUNTER — Other Ambulatory Visit: Payer: Self-pay | Admitting: Family Medicine

## 2018-02-23 ENCOUNTER — Telehealth: Payer: Self-pay

## 2018-02-23 NOTE — Telephone Encounter (Signed)
Pt came by the office. She said for about 2 weeks she has had episodes that she feels like her food is getting stuck in her esophagus again. She is able to swallow food and liquids. She said her voice is hoarse and she is burping a lot. She is taking pantoprazole qd. She wants to know if there is anything else she can do?

## 2018-02-23 NOTE — Telephone Encounter (Signed)
She can either increase her pantoprazole to 40mg  bid before a meal OR stop pantoprazole and try dexilant 60mg  daily, #30 and no refills.   She has been known to wean herself off her PPI so please find out if she has been taking it consistently since seen six months ago and if not did symptoms start after she stopped the PPI.   She needs ov with RMR only.

## 2018-02-24 ENCOUNTER — Telehealth: Payer: Self-pay | Admitting: Gastroenterology

## 2018-02-24 NOTE — Telephone Encounter (Signed)
See other phone note. I have spoken to pt and she is aware to take Pantoprazole bid 30 min prior to meals.

## 2018-02-24 NOTE — Telephone Encounter (Signed)
Pt called to ask DS another question about her medications [phone]

## 2018-02-24 NOTE — Telephone Encounter (Signed)
Pt will just increase the Pantoprazole at this time and see how that does and let us know. ( she uses [facility] in [place]). Forwarding to Manuela Schwartz to schedule OV with Dr.Rourk).

## 2018-02-24 NOTE — Telephone Encounter (Signed)
Pt called again with another question for DS about her medications. Please call her back [phone]  (OV with RMR is 9/27 at 1130)

## 2018-02-24 NOTE — Telephone Encounter (Signed)
Pt is aware to take the Pantoprazole 30 min prior to meals bid.

## 2018-03-16 ENCOUNTER — Telehealth: Payer: Self-pay

## 2018-03-16 NOTE — Telephone Encounter (Signed)
Pt called with c/o reflux. Pt feels like something is stuck in her throat and belches air at times. Pt increased Pantoprazole to bid as directed 02/24/18 and still has problems. When asked how medication is taken, pt said she forgets to take it some times before she eats and takes it while she's eating. Pt also states that she takes it with lunch and dinner. Pt was advised to take it 30 mins before eating breakfast and dinner. Pt isnt sure if she should add anything else for her reflux. Please advise in the absence of LSL.

## 2018-03-16 NOTE — Telephone Encounter (Signed)
As she is not taking this most efficaciously, I agree with taking 30 minutes before breakfast and dinner on an empty stomach. Just remind her it works the best this way. May take up to 14 days to reach full affect.   If no improvement after taking it this way for 1-2 weeks, may trial samples of Dexilant as previously outlined in phone notes. Further recommendations per Magda Paganini.

## 2018-03-16 NOTE — Telephone Encounter (Signed)
Pt.notified

## 2018-03-21 NOTE — Telephone Encounter (Signed)
Agree with recommendations provided. If no better taking pantoprazole BID 30 minutes before BF and evening meal then we can try Dexilant samples.   Keep upcoming ov with RMR.

## 2018-03-21 NOTE — Telephone Encounter (Signed)
Noted. Pt is aware 

## 2018-03-22 ENCOUNTER — Telehealth: Payer: Self-pay | Admitting: Internal Medicine

## 2018-03-22 NOTE — Telephone Encounter (Signed)
Pt said the pantoprazole hasn't been helping her any and could she get Dexilant samples. Please call [phone]

## 2018-03-22 NOTE — Telephone Encounter (Signed)
Spoke with pt. We currently don't have any samples, will call pt when samples are available.

## 2018-03-24 NOTE — Telephone Encounter (Signed)
Dexilant samples are available and ready for pickup. Pt is aware.

## 2018-04-01 ENCOUNTER — Other Ambulatory Visit: Payer: Self-pay | Admitting: Family Medicine

## 2018-04-01 DIAGNOSIS — Z1231 Encounter for screening mammogram for malignant neoplasm of breast: Secondary | ICD-10-CM

## 2018-04-04 ENCOUNTER — Telehealth: Payer: Self-pay | Admitting: Internal Medicine

## 2018-04-04 NOTE — Telephone Encounter (Signed)
Tried calling pt. Mailbox is full. Will call again.

## 2018-04-04 NOTE — Telephone Encounter (Signed)
Noted. Pt notified and will continue Dexilant.. Pt is going to f/u with her PCP while waiting to see RMR.

## 2018-04-04 NOTE — Telephone Encounter (Signed)
Spoke with pt. Pt is concerned with the feeling of something stuck in her throat for several weeks. Pt has a hx of reflux and isn't sure if she needs to go back to her PCP to discuss her thyroid. Pt was taking Pantoprazole with no help changes and stated Dexilant on 03/24/18. Pt isn't having any nausea or diarrhea.

## 2018-04-04 NOTE — Telephone Encounter (Signed)
Continue Dexilant.   Keep upcoming [place] with RMR 04/2018. She has h/o reflux esophagitis and esophageal motility problems. Cannot rule out the cause for her symptoms but would be reasonable for her to f/u with PCP to make sure nothing else is going on in the meantime while waiting for RMR only appt.   She may need to be seen by ENT but I would like for her to see RMR or PCP prior to making a referral since I haven't physically seen patient in over 7 months.

## 2018-04-04 NOTE — Telephone Encounter (Signed)
Pt has been taking Dexilant sample and has 4 day worth left. She said that they aren't helping her much, She still feels like something is stuck in her throat and has hoarseness. Please advise and call [phone] or [phone]

## 2018-04-05 ENCOUNTER — Ambulatory Visit (INDEPENDENT_AMBULATORY_CARE_PROVIDER_SITE_OTHER): Payer: PPO | Admitting: Family Medicine

## 2018-04-05 ENCOUNTER — Encounter: Payer: Self-pay | Admitting: Family Medicine

## 2018-04-05 ENCOUNTER — Other Ambulatory Visit: Payer: Self-pay

## 2018-04-05 ENCOUNTER — Ambulatory Visit (HOSPITAL_COMMUNITY)
Admission: RE | Admit: 2018-04-05 | Discharge: 2018-04-05 | Disposition: A | Discharge status: Home / Self Care | Payer: PPO | Source: Ambulatory Visit | Attending: Family Medicine | Admitting: Family Medicine

## 2018-04-05 VITALS — BP 140/78 | HR 80 | Temp 98.3°F | Resp 14 | Ht 64.0 in | Wt 180.0 lb

## 2018-04-05 DIAGNOSIS — E038 Other specified hypothyroidism: Secondary | ICD-10-CM | POA: Diagnosis not present

## 2018-04-05 DIAGNOSIS — J040 Acute laryngitis: Secondary | ICD-10-CM

## 2018-04-05 DIAGNOSIS — Z8639 Personal history of other endocrine, nutritional and metabolic disease: Secondary | ICD-10-CM | POA: Diagnosis not present

## 2018-04-05 DIAGNOSIS — R1310 Dysphagia, unspecified: Secondary | ICD-10-CM | POA: Diagnosis not present

## 2018-04-05 DIAGNOSIS — R221 Localized swelling, mass and lump, neck: Secondary | ICD-10-CM | POA: Diagnosis not present

## 2018-04-05 MED ORDER — MOMETASONE FUROATE 50 MCG/ACT NA SUSP: 2.0000 | Freq: Every day | NASAL | 1 refills | Status: DC

## 2018-04-05 MED ORDER — PREDNISONE 20 MG PO TABS: ORAL_TABLET | ORAL | 0 refills | Status: DC

## 2018-04-05 MED ORDER — CETIRIZINE HCL 10 MG PO TABS: 10.0000 mg | ORAL_TABLET | Freq: Every day | ORAL | 11 refills | Status: DC

## 2018-04-05 MED ORDER — MONTELUKAST SODIUM 10 MG PO TABS: 10.0000 mg | ORAL_TABLET | Freq: Every day | ORAL | 3 refills | Status: DC

## 2018-04-05 NOTE — Patient Instructions (Signed)
Will check labs, xrays and ultrasound.  Treat possible areas of swelling/inflammation of upper airways, throat, neck, sinuses  Follow up in 2 weeks if not improving

## 2018-04-05 NOTE — Progress Notes (Signed)
Patient ID: Jill Roach, female    DOB: Sep 14, 1950, 67 y.o.   MRN: [id number]  PCP: Alycia Rossetti, MD  Chief Complaint  Patient presents with  . Throat Fullness    feels like there is something stuck in her throat    Subjective:   Jill Roach is a 67 y.o. female, presents to clinic with CC of throat fullness, feels like something is stuck or swollen in her throat x 1 month.  She has raspy voice, mild dysphagia, intermittent non-productive cough with sensation of choking and some episodes of waking up feeling choking and dypnea (naps and nighttime).  She denies pain with swallowing.  She denies injury, foreign body.  No choking on solids or liquids.  She is concerned with past hx of thyroid nodules and hypothyroid.  She felt just like this before her thyroid disease was diagnosed, and she denies any tenderness or enlargement of anterior neck, but states it "doesn't feel right."   She has been seeing her GI for same complaints.  They switched her off protonix to dexilant almost 2 weeks ago, but she has no change in her sx.  She has PMHx of esophagitis, GERD, esophageal dysmotility and globus sensation.  She has had procedures done in the past with GI, but she was instructed by them to go back to her PCP or to get a referral to ENT to further evaluate her symptoms.  She denies any recent upper endoscopy or procedures by the gastroenterologist.  She has some dyspepsia and belching but she denies any current uncontrolled or worsening acid reflux. Over the past month patient states that she has been coughing, has been nonproductive, scratchy, associated dyspnea when climbing the stairs or when sitting at church, also associated choking with coughing but no posttussive emesis, no wheeze, no chest pain.  She has had bronchitis several times in the past but this does not feel like it. Patient is also concerned that she has been taking Synthroid and PPI medicine at the same time in the morning  and she is concerned that PPI has interfered with her thyroid medication and that is why she is having swelling in her throat and neck.  She is very concerned with past history of multiple thyroid ultrasounds repeated over and over again. Patient also is concerned with possible food allergies that she has not been able to identify what she is allergic to but she states she is having sneezing fits after eating.  She is not currently on any allergy medication   Patient Active Problem List   Diagnosis Date Noted  . Reflux esophagitis 02/04/2017  . Colon cancer screening 10/05/2016  . Globus sensation 10/05/2016  . Osteopenia 09/28/2016  . Obesity 03/24/2016  . Chronic insomnia 03/24/2016  . Arthritis 09/25/2015  . Hypertension   . Hyperlipidemia   . Hypothyroidism      Prior to Admission medications   Medication Sig Start Date End Date Taking? Authorizing Provider  Calcium-Magnesium-Vitamin D (CALCIUM 1200+D3 PO) Take 1 tablet by mouth daily.   Yes [provider]  dexlansoprazole (DEXILANT) 60 MG capsule Take 60 mg by mouth daily.   Yes [provider]  fluticasone (FLONASE) 50 MCG/ACT nasal spray Place 2 sprays into both nostrils daily. 11/29/17  Yes Delsa Grana, PA-C  levothyroxine (SYNTHROID, LEVOTHROID) 88 MCG tablet TAKE 1 TABLET BY MOUTH DAILY 01/14/18  Yes Onset, Modena Nunnery, MD  lisinopril-hydrochlorothiazide (PRINZIDE,ZESTORETIC) 20-12.5 MG tablet TAKE ONE TABLET BY MOUTH ONCE DAILY  05/04/17  Yes [place], Modena Nunnery, MD  montelukast (SINGULAIR) 10 MG tablet Take 1 tablet (10 mg total) by mouth at bedtime. 11/29/17  Yes Chani Ghanem, Kristeen Miss, PA-C  Omega 3 1200 MG CAPS Take by mouth.   Yes [provider]     Allergies  Allergen Reactions  . Codeine Nausea Only     Family History  Problem Relation Age of Onset  . Hypertension Mother   . Arthritis Father        Rheumatoid   . Diabetes Father   . Hypertension Father   . Thyroid disease Father   . Arthritis  Brother        Rheumatoid  . Thyroid disease Brother   . Heart disease Maternal Grandmother        stomach cancer  . Hypertension Maternal Grandmother   . Cancer Paternal Grandmother        LEUKEMIA/LUNG CNACER,  . Thyroid disease Paternal Grandmother   . Hypertension Paternal Grandmother   . Cancer Other   . Mental illness Other   . Cancer Maternal Uncle   . Thyroid disease Paternal Aunt   . Colon cancer Neg Hx      Social History   Socioeconomic History  . Marital status: Divorced    Spouse name: Not on file  . Number of children: Not on file  . Years of education: Not on file  . Highest education level: Not on file  Occupational History  . Not on file  Social Needs  . Financial resource strain: Not on file  . Food insecurity:    Worry: Not on file    Inability: Not on file  . Transportation needs:    Medical: Not on file    Non-medical: Not on file  Tobacco Use  . Smoking status: Never Smoker  . Smokeless tobacco: Never Used  Substance and Sexual Activity  . Alcohol use: No  . Drug use: No  . Sexual activity: Not Currently  Lifestyle  . Physical activity:    Days per week: Not on file    Minutes per session: Not on file  . Stress: Not on file  Relationships  . Social connections:    Talks on phone: Not on file    Gets together: Not on file    Attends religious service: Not on file    Active member of club or organization: Not on file    Attends meetings of clubs or organizations: Not on file    Relationship status: Not on file  . Intimate partner violence:    Fear of current or ex partner: Not on file    Emotionally abused: Not on file    Physically abused: Not on file    Forced sexual activity: Not on file  Other Topics Concern  . Not on file  Social History Narrative  . Not on file     Review of Systems  Constitutional: Negative.  Negative for activity change, appetite change, chills, diaphoresis, fatigue, fever and unexpected weight change.    HENT: Positive for sneezing and voice change. Negative for congestion, drooling, ear discharge, ear pain, facial swelling, hearing loss, mouth sores, postnasal drip, rhinorrhea, sinus pressure and sinus pain.   Eyes: Negative.   Respiratory: Positive for cough, choking and shortness of breath. Negative for chest tightness, wheezing and stridor.   Cardiovascular: Negative for chest pain, palpitations and leg swelling.  Gastrointestinal: Negative.  Negative for abdominal distention, abdominal pain and nausea.  Endocrine: Negative.  Genitourinary: Negative.   Musculoskeletal: Negative.  Negative for myalgias, neck pain and neck stiffness.  Skin: Negative.  Negative for rash.  Allergic/Immunologic: Positive for environmental allergies and food allergies. Negative for immunocompromised state.  Neurological: Negative.   Hematological: Negative.  Negative for adenopathy.  Psychiatric/Behavioral: Negative.   All other systems reviewed and are negative.      Objective:    Vitals:   04/05/18 1127  BP: 140/78  Pulse: 80  Resp: 14  Temp: 98.3 F (36.8 C)  TempSrc: Oral  SpO2: 99%  Weight: 180 lb (81.6 kg)  Height: 5\' 4"  (1.626 m)      Physical Exam  Constitutional: She appears well-developed and well-nourished. No distress.  HENT:  Head: Normocephalic and atraumatic.  Right Ear: Hearing, external ear and ear canal normal.  Left Ear: Hearing, external ear and ear canal normal.  Nose: Mucosal edema present. No rhinorrhea. Right sinus exhibits no maxillary sinus tenderness and no frontal sinus tenderness. Left sinus exhibits no maxillary sinus tenderness and no frontal sinus tenderness.  Mouth/Throat: Uvula is midline and mucous membranes are normal. Mucous membranes are not pale, not dry and not cyanotic. No oral lesions. No trismus in the jaw. No uvula swelling. Posterior oropharyngeal erythema present. No oropharyngeal exudate, posterior oropharyngeal edema or tonsillar abscesses.  Tonsils are 0 on the right. Tonsils are 0 on the left.  Eyes: Conjunctivae are normal. Right eye exhibits no discharge. Left eye exhibits no discharge.  Neck: Trachea normal, normal range of motion and full passive range of motion without pain. Neck supple. No tracheal tenderness, no spinous process tenderness and no muscular tenderness present. No neck rigidity. No tracheal deviation, no edema, no erythema and normal range of motion present. Thyromegaly (mild, no nodules palpated, no tenderness) present. No thyroid mass present.  Raspy voice, no stridor  Cardiovascular: Normal rate, regular rhythm, normal heart sounds, intact distal pulses and normal pulses.  No extrasystoles are present. PMI is not displaced. Exam reveals no gallop and no friction rub.  No murmur heard. Pulmonary/Chest: Effort normal. No accessory muscle usage or stridor. No tachypnea. No respiratory distress. She has decreased breath sounds. She has no wheezes. She has no rhonchi. She has no rales.  Abdominal: Soft. Bowel sounds are normal. She exhibits no distension. There is no tenderness.  Musculoskeletal: Normal range of motion.  Neurological: She is alert. She exhibits normal muscle tone. Coordination normal.  Skin: Skin is warm and dry. No rash noted. She is not diaphoretic.  Psychiatric: Her speech is normal. Her mood appears anxious.  Nursing note and vitals reviewed.         Assessment & Plan:      ICD-10-CM   1. Dysphagia, unspecified type R13.10 CBC with Differential/Platelet    TSH    BASIC METABOLIC PANEL WITH GFR    US Soft Tissue Head/Neck    DG Neck Soft Tissue  2. Laryngitis J04.0 CBC with Differential/Platelet    TSH    BASIC METABOLIC PANEL WITH GFR    US Soft Tissue Head/Neck    DG Neck Soft Tissue  3. Other specified hypothyroidism E03.8 TSH    US Soft Tissue Head/Neck  4. H/O thyroid nodule Z86.39 TSH    US Soft Tissue Head/Neck    May be URI, allergies, GERD or esophogeal dysmotility  related.  Pt is very concerned about her thyroid being involved, will obtain TSH and thyroid US, she had similar sx in the past with dx of thyroid nodules.  F/up in 2 weeks F/up GI if dysphagia worsens If HENT sx do not improve may need ENT referral Also may be psych related, pt was very anxious, same hx of globus sensation, GERD appears maximally treated/optimally managed.  Plain films to eval airway/epiglottis  Delsa Grana, PA-C 04/05/18 11:59 AM

## 2018-04-06 LAB — BASIC METABOLIC PANEL WITH GFR
BUN: 19 mg/dL (ref 7–25)
CO2: 25 mmol/L (ref 20–32)
Calcium: 10 mg/dL (ref 8.6–10.4)
Chloride: 103 mmol/L (ref 98–110)
Creat: 0.9 mg/dL (ref 0.50–0.99)
GFR, Est African American: 77 mL/min/{1.73_m2} (ref >=60)
GFR, Est Non African American: 66 mL/min/{1.73_m2} (ref >=60)
Glucose, Bld: 111 mg/dL — ABNORMAL HIGH (ref 65–99)
Potassium: 4 mmol/L (ref 3.5–5.3)
Sodium: 139 mmol/L (ref 135–146)

## 2018-04-06 LAB — CBC WITH DIFFERENTIAL/PLATELET
Basophils Absolute: 75 cells/uL (ref 0–200)
Basophils Relative: 0.8 %
Eosinophils Absolute: 75 cells/uL (ref 15–500)
Eosinophils Relative: 0.8 %
HCT: 41.6 % (ref 35.0–45.0)
Hemoglobin: 14.5 g/dL (ref 11.7–15.5)
Lymphs Abs: 2632 cells/uL (ref 850–3900)
MCH: 29.2 pg (ref 27.0–33.0)
MCHC: 34.9 g/dL (ref 32.0–36.0)
MCV: 83.9 fL (ref 80.0–100.0)
MPV: 10.6 fL (ref 7.5–12.5)
Monocytes Relative: 7.6 %
Neutro Abs: 5903 cells/uL (ref 1500–7800)
Neutrophils Relative %: 62.8 %
Platelets: 324 10*3/uL (ref 140–400)
RBC: 4.96 10*6/uL (ref 3.80–5.10)
RDW: 13.9 % (ref 11.0–15.0)
Total Lymphocyte: 28 %
WBC mixed population: 714 cells/uL (ref 200–950)
WBC: 9.4 10*3/uL (ref 3.8–10.8)

## 2018-04-06 LAB — TSH: TSH: 3.48 mIU/L (ref 0.40–4.50)

## 2018-04-07 ENCOUNTER — Telehealth: Payer: Self-pay | Admitting: Internal Medicine

## 2018-04-07 NOTE — Telephone Encounter (Signed)
Spoke with pt, samples are available and ready for pickup.

## 2018-04-07 NOTE — Telephone Encounter (Signed)
Pt called to see if she could get Dexilant samples. [phone]

## 2018-04-12 ENCOUNTER — Ambulatory Visit (HOSPITAL_COMMUNITY): Payer: PPO

## 2018-04-13 ENCOUNTER — Ambulatory Visit (HOSPITAL_COMMUNITY)
Admission: RE | Admit: 2018-04-13 | Discharge: 2018-04-13 | Disposition: A | Discharge status: Home / Self Care | Payer: PPO | Source: Ambulatory Visit | Attending: Family Medicine | Admitting: Family Medicine

## 2018-04-13 ENCOUNTER — Ambulatory Visit (HOSPITAL_COMMUNITY): Payer: PPO

## 2018-04-13 DIAGNOSIS — Z1231 Encounter for screening mammogram for malignant neoplasm of breast: Secondary | ICD-10-CM | POA: Insufficient documentation

## 2018-04-13 DIAGNOSIS — E038 Other specified hypothyroidism: Secondary | ICD-10-CM | POA: Diagnosis not present

## 2018-04-13 DIAGNOSIS — J040 Acute laryngitis: Secondary | ICD-10-CM | POA: Insufficient documentation

## 2018-04-13 DIAGNOSIS — R1310 Dysphagia, unspecified: Secondary | ICD-10-CM | POA: Diagnosis not present

## 2018-04-19 ENCOUNTER — Other Ambulatory Visit: Payer: Self-pay

## 2018-04-19 ENCOUNTER — Ambulatory Visit (INDEPENDENT_AMBULATORY_CARE_PROVIDER_SITE_OTHER): Payer: PPO | Admitting: Family Medicine

## 2018-04-19 ENCOUNTER — Encounter: Payer: Self-pay | Admitting: Family Medicine

## 2018-04-19 VITALS — BP 136/72 | HR 82 | Temp 98.6°F | Resp 14 | Ht 64.0 in | Wt 183.0 lb

## 2018-04-19 DIAGNOSIS — R0989 Other specified symptoms and signs involving the circulatory and respiratory systems: Secondary | ICD-10-CM

## 2018-04-19 DIAGNOSIS — F458 Other somatoform disorders: Secondary | ICD-10-CM

## 2018-04-19 DIAGNOSIS — J040 Acute laryngitis: Secondary | ICD-10-CM

## 2018-04-19 DIAGNOSIS — R1310 Dysphagia, unspecified: Secondary | ICD-10-CM

## 2018-04-19 DIAGNOSIS — J309 Allergic rhinitis, unspecified: Secondary | ICD-10-CM | POA: Diagnosis not present

## 2018-04-19 DIAGNOSIS — R198 Other specified symptoms and signs involving the digestive system and abdomen: Secondary | ICD-10-CM

## 2018-04-19 MED ORDER — LEVOCETIRIZINE DIHYDROCHLORIDE 5 MG PO TABS: 5.0000 mg | ORAL_TABLET | Freq: Every evening | ORAL | 1 refills | Status: DC

## 2018-04-19 NOTE — Patient Instructions (Addendum)
Continue your acid reflux medicine and allergy medicines.  Referred you to ENT

## 2018-04-19 NOTE — Progress Notes (Signed)
Patient ID: Jill Roach, female    DOB: [birth year], 67 y.o.   MRN: [id number]  PCP: Alycia Rossetti, MD  Chief Complaint  Patient presents with  . Follow-up    dysphagia- thyroid noted ok- continues to have feeling of fullness in throat when she tries to swallow    Subjective:   Jill Roach is a 67 y.o. female, presents to clinic with CC of dysphagia, globus sensation, scratchy voice not improved with treating GERD and URI/allergies.  She returns for recheck and her GI dr and myself had previously discussed possible ENT referral.  Thyroid involvement was also a concern, but work up was negative.  She still feels "a little something in there."  Reports it temporarily got a little better, when on steroids, however when she finished steroids it got worse again.   She is following up with GI on Sept 30th.  She feels acid reflux controlled, no pain with swallowing no choking and sensation of food getting stuck, no dysphagia of liquids.  She is almost out of dexilant samples and cannot afford the cost.  Samples given today.  No CP, abd pain, dyspepsia, bowel change.  Nasal sx slightly better, cough better, feeling in throat the same.  No fever, chills, sweats, weightloss.   04/05/18 HPI for same CC: Jill Roach is a 67 y.o. female, presents to clinic with CC of throat fullness, feels like something is stuck or swollen in her throat x 1 month.  She has raspy voice, mild dysphagia, intermittent non-productive cough with sensation of choking and some episodes of waking up feeling choking and dypnea (naps and nighttime).  She denies pain with swallowing.  She denies injury, foreign body.  No choking on solids or liquids.  She is concerned with past hx of thyroid nodules and hypothyroid.  She felt just like this before her thyroid disease was diagnosed, and she denies any tenderness or enlargement of anterior neck, but states it "doesn't feel right."   She has been seeing her GI for  same complaints.  They switched her off protonix to dexilant almost 2 weeks ago, but she has no change in her sx.  She has PMHx of esophagitis, GERD, esophageal dysmotility and globus sensation.  She has had procedures done in the past with GI, but she was instructed by them to go back to her PCP or to get a referral to ENT to further evaluate her symptoms.  She denies any recent upper endoscopy or procedures by the gastroenterologist.  She has some dyspepsia and belching but she denies any current uncontrolled or worsening acid reflux. Over the past month patient states that she has been coughing, has been nonproductive, scratchy, associated dyspnea when climbing the stairs or when sitting at church, also associated choking with coughing but no posttussive emesis, no wheeze, no chest pain.  She has had bronchitis several times in the past but this does not feel like it. Patient is also concerned that she has been taking Synthroid and PPI medicine at the same time in the morning and she is concerned that PPI has interfered with her thyroid medication and that is why she is having swelling in her throat and neck.  She is very concerned with past history of multiple thyroid ultrasounds repeated over and over again. Patient also is concerned with possible food allergies that she has not been able to identify what she is allergic to but she states she is having sneezing fits after  eating.  She is not currently on any allergy medication   Patient Active Problem List   Diagnosis Date Noted  . Reflux esophagitis 02/04/2017  . Colon cancer screening 10/05/2016  . Globus sensation 10/05/2016  . Osteopenia 09/28/2016  . Obesity 03/24/2016  . Chronic insomnia 03/24/2016  . Arthritis 09/25/2015  . Hypertension   . Hyperlipidemia   . Hypothyroidism      Prior to Admission medications   Medication Sig Start Date End Date Taking? Authorizing Provider  Calcium-Magnesium-Vitamin D (CALCIUM 1200+D3 PO) Take 1  tablet by mouth daily.   Yes [provider]  cetirizine (ZYRTEC) 10 MG tablet Take 1 tablet (10 mg total) by mouth daily. 04/05/18  Yes Delsa Grana, PA-C  dexlansoprazole (DEXILANT) 60 MG capsule Take 60 mg by mouth daily.   Yes [provider]  fluticasone (FLONASE) 50 MCG/ACT nasal spray Place 2 sprays into both nostrils daily. 11/29/17  Yes Delsa Grana, PA-C  levothyroxine (SYNTHROID, LEVOTHROID) 88 MCG tablet TAKE 1 TABLET BY MOUTH DAILY 01/14/18  Yes [employer], Modena Nunnery, MD  lisinopril-hydrochlorothiazide (PRINZIDE,ZESTORETIC) 20-12.5 MG tablet TAKE ONE TABLET BY MOUTH ONCE DAILY 05/04/17  Yes [place], Modena Nunnery, MD  mometasone (NASONEX) 50 MCG/ACT nasal spray Place 2 sprays into the nose daily. 04/05/18  Yes Delsa Grana, PA-C  montelukast (SINGULAIR) 10 MG tablet Take 1 tablet (10 mg total) by mouth at bedtime. 11/29/17  Yes Matelyn Antonelli, Kristeen Miss, PA-C  Omega 3 1200 MG CAPS Take by mouth.   Yes [provider]     Allergies  Allergen Reactions  . Codeine Nausea Only     Family History  Problem Relation Age of Onset  . Hypertension Mother   . Arthritis Father        Rheumatoid   . Diabetes Father   . Hypertension Father   . Thyroid disease Father   . Arthritis Brother        Rheumatoid  . Thyroid disease Brother   . Heart disease Maternal Grandmother        stomach cancer  . Hypertension Maternal Grandmother   . Cancer Paternal Grandmother        LEUKEMIA/LUNG CNACER,  . Thyroid disease Paternal Grandmother   . Hypertension Paternal Grandmother   . Cancer Other   . Mental illness Other   . Cancer Maternal Uncle   . Thyroid disease Paternal Aunt   . Colon cancer Neg Hx      Social History   Socioeconomic History  . Marital status: Divorced    Spouse name: Not on file  . Number of children: Not on file  . Years of education: Not on file  . Highest education level: Not on file  Occupational History  . Not on file  Social Needs  . Financial resource  strain: Not on file  . Food insecurity:    Worry: Not on file    Inability: Not on file  . Transportation needs:    Medical: Not on file    Non-medical: Not on file  Tobacco Use  . Smoking status: Never Smoker  . Smokeless tobacco: Never Used  Substance and Sexual Activity  . Alcohol use: No  . Drug use: No  . Sexual activity: Not Currently  Lifestyle  . Physical activity:    Days per week: Not on file    Minutes per session: Not on file  . Stress: Not on file  Relationships  . Social connections:    Talks on phone: Not on file  Gets together: Not on file    Attends religious service: Not on file    Active member of club or organization: Not on file    Attends meetings of clubs or organizations: Not on file    Relationship status: Not on file  . Intimate partner violence:    Fear of current or ex partner: Not on file    Emotionally abused: Not on file    Physically abused: Not on file    Forced sexual activity: Not on file  Other Topics Concern  . Not on file  Social History Narrative  . Not on file     Review of Systems  Constitutional: Negative.   HENT: Negative.   Eyes: Negative.   Respiratory: Negative.   Cardiovascular: Negative.   Gastrointestinal: Negative.   Endocrine: Negative.   Genitourinary: Negative.   Musculoskeletal: Negative.   Skin: Negative.   Allergic/Immunologic: Negative.   Neurological: Negative.   Hematological: Negative.   Psychiatric/Behavioral: Negative.   10 Systems reviewed and are negative for acute change except as noted in the HPI.      Objective:    Vitals:   04/19/18 1120  BP: 136/72  Pulse: 82  Resp: 14  Temp: 98.6 F (37 C)  TempSrc: Oral  SpO2: 98%  Weight: 183 lb (83 kg)  Height: 5\' 4"  (1.626 m)      Physical Exam  Constitutional: She appears well-developed and well-nourished. No distress.  HENT:  Head: Normocephalic and atraumatic.  Right Ear: Hearing, external ear and ear canal normal.  Left Ear:  Hearing, external ear and ear canal normal.  Nose: Mucosal edema (improving) present. No rhinorrhea. Right sinus exhibits no maxillary sinus tenderness and no frontal sinus tenderness. Left sinus exhibits no maxillary sinus tenderness and no frontal sinus tenderness.  Mouth/Throat: Uvula is midline and mucous membranes are normal. Mucous membranes are not pale, not dry and not cyanotic. No oral lesions. No trismus in the jaw. No uvula swelling. No oropharyngeal exudate, posterior oropharyngeal edema, posterior oropharyngeal erythema or tonsillar abscesses. Tonsils are 0 on the right. Tonsils are 0 on the left.  Eyes: Pupils are equal, round, and reactive to light. Conjunctivae and EOM are normal. Right eye exhibits no discharge. Left eye exhibits no discharge.  Neck: Trachea normal, normal range of motion and full passive range of motion without pain. Neck supple. No tracheal tenderness, no spinous process tenderness and no muscular tenderness present. No neck rigidity. No tracheal deviation, no edema, no erythema and normal range of motion present. No thyroid mass present. Thyromegaly: mild, no nodules palpated, no tenderness.  Raspy voice, no stridor  Cardiovascular: Normal rate, regular rhythm, normal heart sounds, intact distal pulses and normal pulses.  No extrasystoles are present. PMI is not displaced. Exam reveals no gallop and no friction rub.  No murmur heard. Pulmonary/Chest: Effort normal. No accessory muscle usage or stridor. No tachypnea. No respiratory distress. She has no decreased breath sounds. She has no wheezes. She has no rhonchi. She has no rales.  Abdominal: Soft. Bowel sounds are normal. She exhibits no distension. There is no tenderness.  Musculoskeletal: Normal range of motion.  Neurological: She is alert. She exhibits normal muscle tone. Coordination normal.  Skin: Skin is warm and dry. No rash noted. She is not diaphoretic.  Psychiatric: Her speech is normal. Her mood appears  anxious.  Nursing note and vitals reviewed.         Assessment & Plan:      ICD-10-CM  1. Dysphagia, unspecified type R13.10   2. Laryngitis J04.0 Ambulatory referral to ENT  3. Globus sensation F45.8 Ambulatory referral to ENT  4. Allergic rhinitis, unspecified seasonality, unspecified trigger J30.9 levocetirizine (XYZAL) 5 MG tablet    Not improving with max treatment of allergies and GERD.  Per GI would like ENT eval.   Pt reports dysphagia, however no true sx of choking or obstruction, does feel tight and something in there.  Delsa Grana, PA-C 04/21/18 12:07 PM

## 2018-04-20 ENCOUNTER — Ambulatory Visit (HOSPITAL_COMMUNITY): Payer: PPO

## 2018-04-27 DIAGNOSIS — E039 Hypothyroidism, unspecified: Secondary | ICD-10-CM | POA: Diagnosis not present

## 2018-04-27 DIAGNOSIS — K219 Gastro-esophageal reflux disease without esophagitis: Secondary | ICD-10-CM | POA: Diagnosis not present

## 2018-05-06 ENCOUNTER — Encounter: Payer: Self-pay | Admitting: Internal Medicine

## 2018-05-06 ENCOUNTER — Ambulatory Visit: Payer: PPO | Admitting: Internal Medicine

## 2018-05-06 VITALS — BP 152/85 | HR 78 | Temp 97.6°F | Ht 64.0 in | Wt 177.8 lb

## 2018-05-06 DIAGNOSIS — K219 Gastro-esophageal reflux disease without esophagitis: Secondary | ICD-10-CM | POA: Diagnosis not present

## 2018-05-06 DIAGNOSIS — F458 Other somatoform disorders: Secondary | ICD-10-CM

## 2018-05-06 NOTE — Patient Instructions (Signed)
Stop Dexilant  Resume Pantoprazole - 40 mg twice daily (disp 60) with 11 refills  GERD information /diet  Elevate the head of your bed with 2x4 blocks  If symptoms related to reflux, may take 3-6 months to improve  See primary care doctor regarding difficulties sleeping and stress issues.  Office visit her in 3 months

## 2018-05-06 NOTE — Progress Notes (Signed)
Primary Care Physician:  Alycia Rossetti, MD Primary Gastroenterologist:  Dr.  Gala Romney  Pre-Procedure History & Physical: HPI:  Jill Roach is a 67 y.o. female here for further evaluation of a "lump in her throat".  Denies dysphagia to solids, liquids and pills.  Reflux symptoms.  Has been on pantoprazole 40 mg daily and more recently Dexilant 60 mg daily which she cannot afford.  Prior barium esophagram demonstrated dysmotility but no structural abnormality.  Says she saw greens per ENT recently and they looked at her vocal cords and felt she might have some reflux. She has had off and on chronic symptoms going back more than a decade.  I note that she is undergone ambulatory esophageal pH and manometry previously orchestrated by Dr. Hassell Done in [place].  Both in the record reported to be normal. She does note a tremendous amount of stress in the process of undergoing a divorce after 67 years of marriage.  Past Medical History:  Diagnosis Date  . Allergy   . Cataract   . GERD (gastroesophageal reflux disease)   . Hyperlipidemia   . Hypertension   . PONV (postoperative nausea and vomiting)   . Thyroid disease     Past Surgical History:  Procedure Laterality Date  . ambulatory esophageal 24 hour pH study  2004   within normal range. Dr. Johnathan Hausen  . COLONOSCOPY N/A 11/04/2016   Procedure: COLONOSCOPY;  Surgeon: Daneil Dolin, MD;  Location: [facility];  Service: Endoscopy;  Laterality: N/A;  10:30am  . ESOPHAGEAL MANOMETRY  2004   Dr. Johnathan Hausen: normal  . ESOPHAGOGASTRODUODENOSCOPY  2003   Dr. Johnathan Hausen: normal   . ESOPHAGOGASTRODUODENOSCOPY N/A 11/04/2016   Procedure: ESOPHAGOGASTRODUODENOSCOPY (EGD);  Surgeon: Daneil Dolin, MD;  Location: [facility];  Service: Endoscopy;  Laterality: N/A;  . EYE SURGERY     cataracts  . FRACTURE SURGERY     cataract surgery   . LIGAMENT REPAIR     rt knee  . MALONEY DILATION N/A 11/04/2016   Procedure: Venia Minks  DILATION;  Surgeon: Daneil Dolin, MD;  Location: [facility];  Service: Endoscopy;  Laterality: N/A;  . TUBAL LIGATION      Prior to Admission medications   Medication Sig Start Date End Date Taking? Authorizing Provider  Calcium-Magnesium-Vitamin D (CALCIUM 1200+D3 PO) Take 1 tablet by mouth daily.   Yes [provider]  dexlansoprazole (DEXILANT) 60 MG capsule Take 60 mg by mouth daily.   Yes [provider]  fluticasone (FLONASE) 50 MCG/ACT nasal spray Place 2 sprays into both nostrils daily. 11/29/17  Yes Delsa Grana, PA-C  levocetirizine (XYZAL) 5 MG tablet Take 1 tablet (5 mg total) by mouth every evening. 04/19/18  Yes Delsa Grana, PA-C  levothyroxine (SYNTHROID, LEVOTHROID) 88 MCG tablet TAKE 1 TABLET BY MOUTH DAILY 01/14/18  Yes [place], Modena Nunnery, MD  lisinopril-hydrochlorothiazide (PRINZIDE,ZESTORETIC) 20-12.5 MG tablet TAKE ONE TABLET BY MOUTH ONCE DAILY 05/04/17  Yes [place], Modena Nunnery, MD  mometasone (NASONEX) 50 MCG/ACT nasal spray Place 2 sprays into the nose daily. 04/05/18  Yes Delsa Grana, PA-C  montelukast (SINGULAIR) 10 MG tablet Take 1 tablet (10 mg total) by mouth at bedtime. 11/29/17  Yes [place], Kristeen Miss, PA-C  Omega 3 1200 MG CAPS Take by mouth.   Yes [provider]  cetirizine (ZYRTEC) 10 MG tablet Take 1 tablet (10 mg total) by mouth daily. Patient not taking: Reported on 05/06/2018 04/05/18   Delsa Grana, PA-C  Allergies as of 05/06/2018 - Review Complete 05/06/2018  Allergen Reaction Noted  . Codeine Nausea Only 09/20/2015    Family History  Problem Relation Age of Onset  . Hypertension Mother   . Arthritis Father        Rheumatoid   . Diabetes Father   . Hypertension Father   . Thyroid disease Father   . Arthritis Brother        Rheumatoid  . Thyroid disease Brother   . Heart disease Maternal Grandmother        stomach cancer  . Hypertension Maternal Grandmother   . Cancer Paternal Grandmother        LEUKEMIA/LUNG CNACER,    . Thyroid disease Paternal Grandmother   . Hypertension Paternal Grandmother   . Cancer Other   . Mental illness Other   . Cancer Maternal Uncle   . Thyroid disease Paternal Aunt   . Colon cancer Neg Hx     Social History   Socioeconomic History  . Marital status: Divorced    Spouse name: Not on file  . Number of children: Not on file  . Years of education: Not on file  . Highest education level: Not on file  Occupational History  . Not on file  Social Needs  . Financial resource strain: Not on file  . Food insecurity:    Worry: Not on file    Inability: Not on file  . Transportation needs:    Medical: Not on file    Non-medical: Not on file  Tobacco Use  . Smoking status: Never Smoker  . Smokeless tobacco: Never Used  Substance and Sexual Activity  . Alcohol use: No  . Drug use: No  . Sexual activity: Not Currently  Lifestyle  . Physical activity:    Days per week: Not on file    Minutes per session: Not on file  . Stress: Not on file  Relationships  . Social connections:    Talks on phone: Not on file    Gets together: Not on file    Attends religious service: Not on file    Active member of club or organization: Not on file    Attends meetings of clubs or organizations: Not on file    Relationship status: Not on file  . Intimate partner violence:    Fear of current or ex partner: Not on file    Emotionally abused: Not on file    Physically abused: Not on file    Forced sexual activity: Not on file  Other Topics Concern  . Not on file  Social History Narrative  . Not on file    Review of Systems: See HPI, otherwise negative ROS  Physical Exam: BP (!) 152/85   Pulse 78   Temp 97.6 F (36.4 C) (Oral)   Ht 5\' 4"  (1.626 m)   Wt 177 lb 12.8 oz (80.6 kg)   BMI 30.52 kg/m  General:   Alert,  Well-developed, well-nourished, pleasant and cooperative in NAD cal adenopathy. Lungs:  Clear throughout to auscultation.   No wheezes, crackles, or rhonchi.  No acute distress. Heart:  Regular rate and rhythm; no murmurs, clicks, rubs,  or gallops. Abdomen: Non-distended, normal bowel sounds.  Soft and nontender without appreciable mass or hepatosplenomegaly.  Pulses:  Normal pulses noted. Extremities:  Without clubbing or edema.  Impression/Plan: 67 year old lady with chronic intermittent globus symptoms.  Quite a bit of situational overlaying anxiety/stress associated with her divorce in process after 44 years  of marriage.  Significant insomnia.  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  History of multiple allergies for she she relates undergoing immunotherapy previously.  Distantly, negative esophageal manometry and ambulatory pH study in [place].  Her globus symptoms may well be multifactorial in origin.  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  Repeat upper endoscopy is not indicated at this time.  Recommendations:  Stop Dexilant  Resume Pantoprazole - 40 mg twice daily (disp 60) with 11 refills  GERD information /diet  Elevate the head of your bed with 2x4 blocks  If symptoms related to reflux, may take 3-6 months to improve  See primary care doctor regarding difficulties sleeping and stress issues.  Office visit her in 3 months   Notice: This dictation was prepared with Dragon dictation along with smaller phrase technology. Any transcriptional errors that result from this process are unintentional and may not be corrected upon review.

## 2018-05-09 ENCOUNTER — Telehealth: Payer: Self-pay

## 2018-05-09 ENCOUNTER — Encounter: Payer: Self-pay | Admitting: Internal Medicine

## 2018-05-09 ENCOUNTER — Telehealth: Payer: Self-pay | Admitting: Family Medicine

## 2018-05-09 NOTE — Telephone Encounter (Signed)
Per Dr. Gala Romney, Rx called to [facility]. Pantoprazole 40 mg bid # 60 with 11 refills.

## 2018-05-09 NOTE — Telephone Encounter (Signed)
Refill on trazodone to [facility].

## 2018-05-10 MED ORDER — TRAZODONE HCL 50 MG PO TABS: 25.0000 mg | ORAL_TABLET | Freq: Every evening | ORAL | 6 refills | Status: DC | PRN

## 2018-05-10 NOTE — Telephone Encounter (Signed)
Prescription sent to pharmacy.

## 2018-05-26 IMAGING — US US SOFT TISSUE HEAD/NECK
1 series · 14 of 25 positions shown · non-contrast
Comparison: 08/21/2008 and earlier studies

CLINICAL DATA: Goiter.  Hypothyroidism.

EXAM:
THYROID ULTRASOUND
TECHNIQUE: Ultrasound examination of the thyroid gland and adjacent soft
tissues was performed.

[Series 1: us soft tissue head/neck · 0.06mm/px · 14 of 48 slices shown]
[im 1/48]
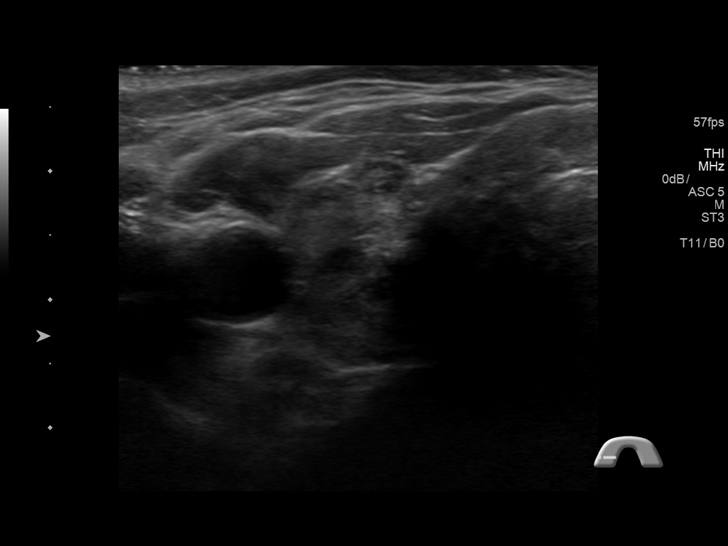
[im 4/48]
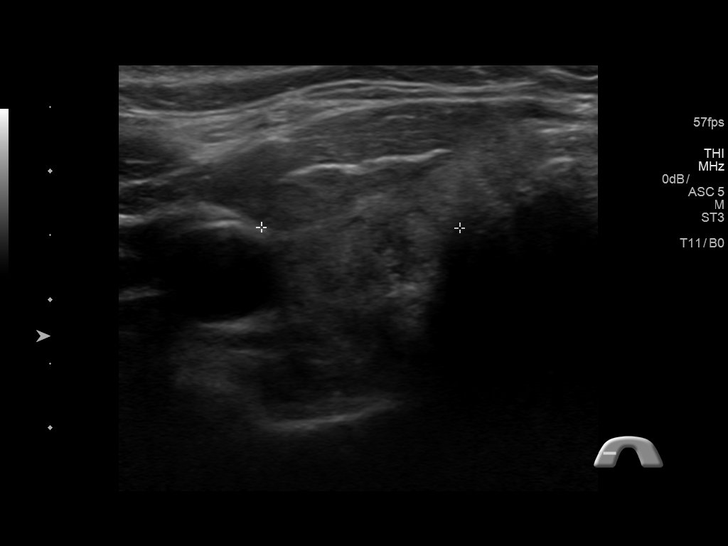
[im 8/48]
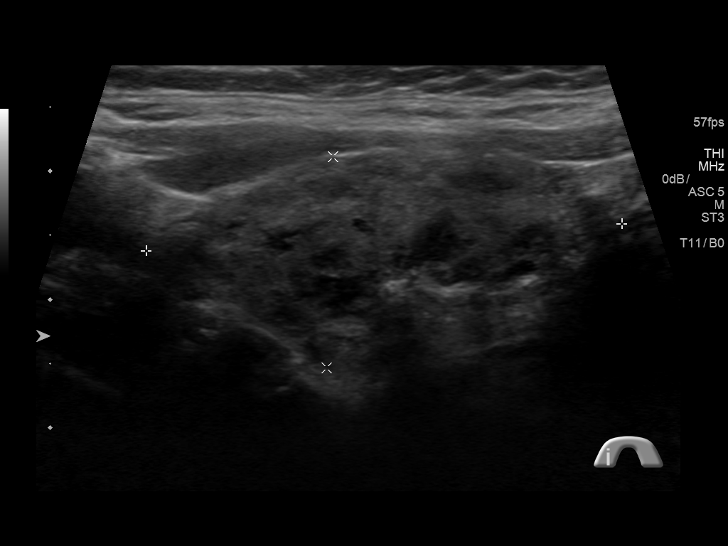
[im 12/48]
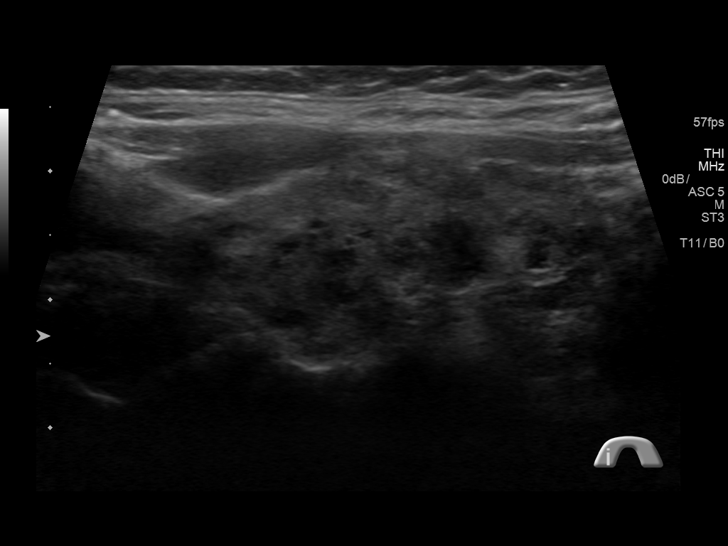
[im 16/48]
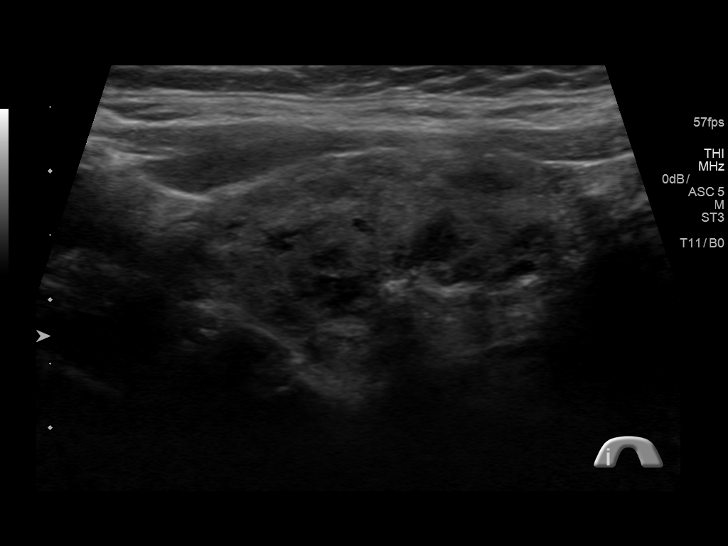
[im 18/48]
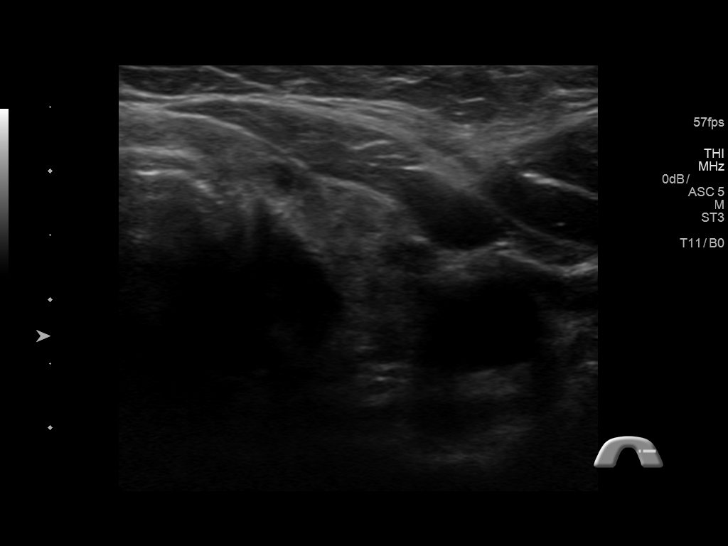
[im 22/48]
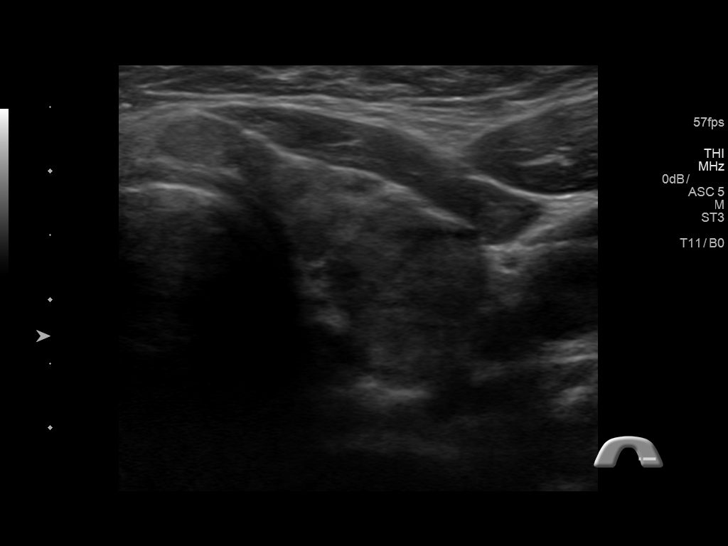
[im 26/48]
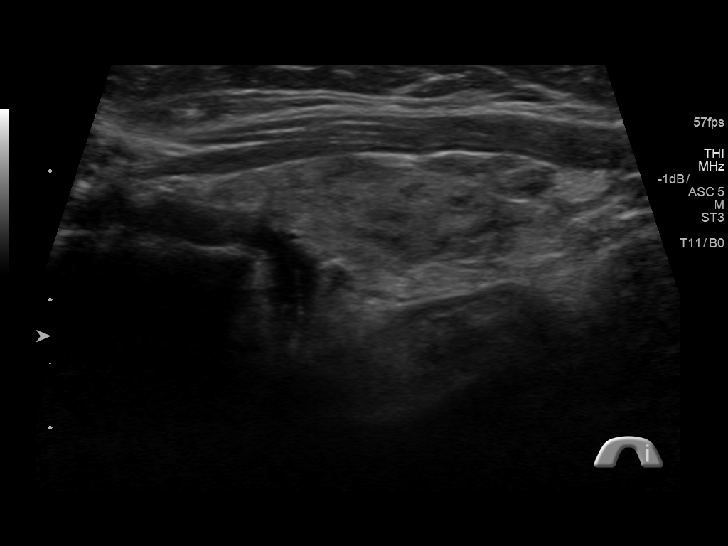
[im 30/48]
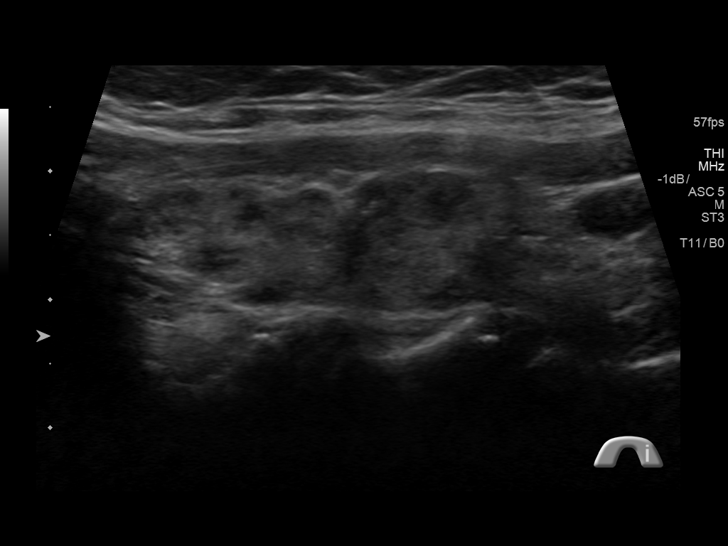
[im 32/48]
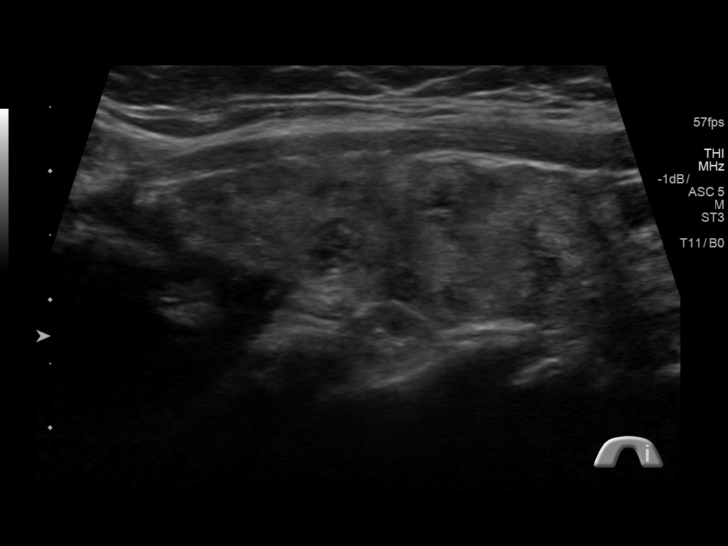
[im 36/48]
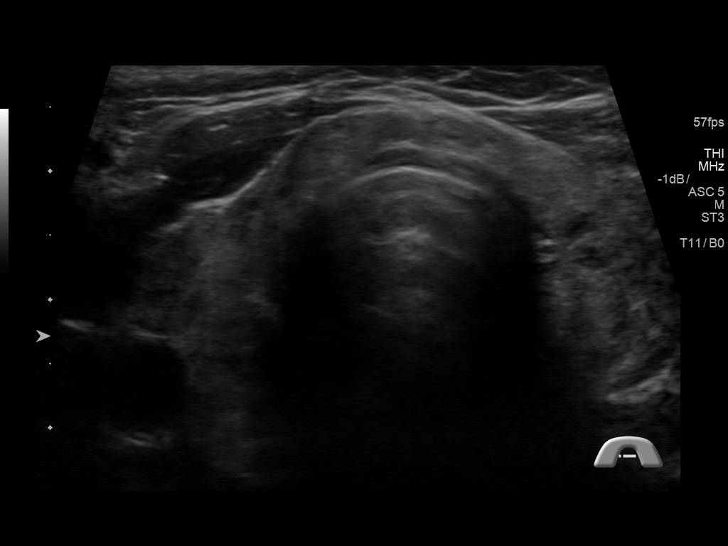
[im 40/48]
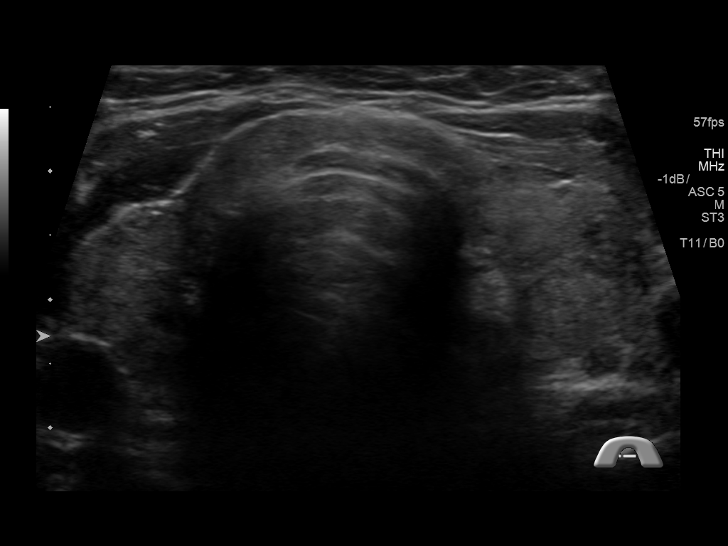
[im 44/48]
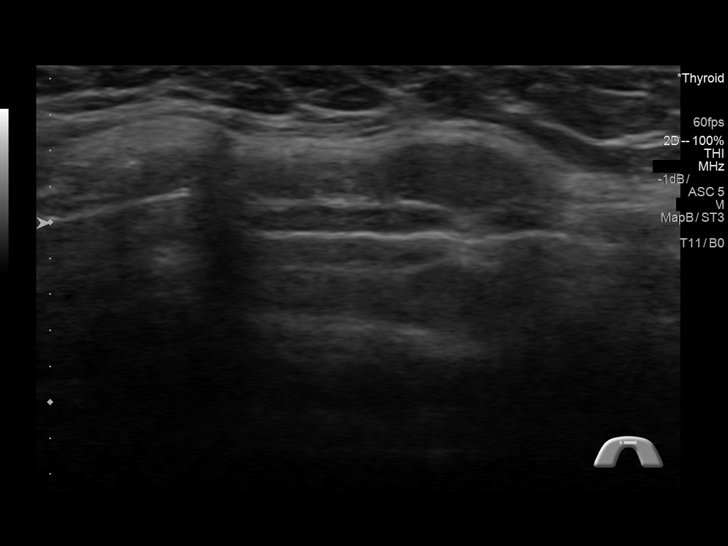
[im 48/48]
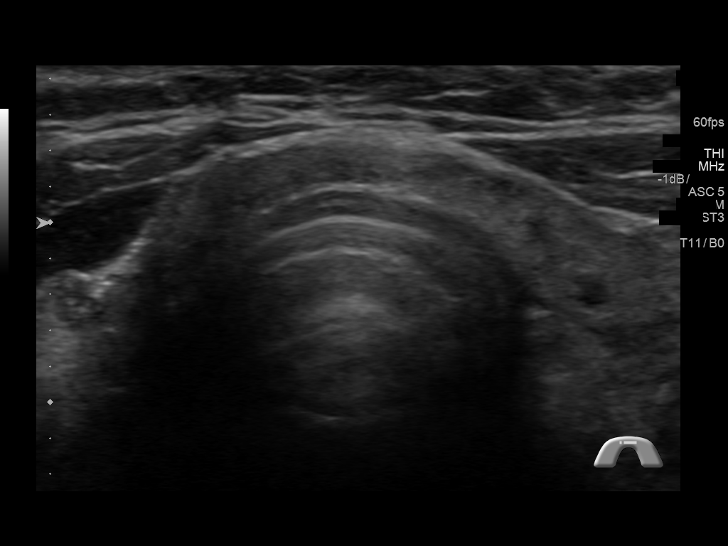

[14 of 25 positions shown; findings below may reference images not displayed]

FINDINGS: Parenchymal Echotexture: Markedly heterogenous and hypervascular

Isthmus: 0.3 cm thickness, stable

Right lobe: 3.7 x 1.7 x 1.6 cm (previously 4 x 1.1 x 1.5)

Left lobe: 4.2 x1.2 x 1.7 cm (previously 3.7 x 1.2 x 1.6)

_________________________________________________________

Estimated total number of nodules >/= 1 cm: 0

Number of spongiform nodules >/=  2 cm not described below (TR1): 0

Number of mixed cystic and solid nodules >/= 1.5 cm not described
below (TR2): 0

Both lobes have a very lobular echotexture without discrete nodule.

0.8 cm nodule without calcifications in the isthmus to the left of
midline, previously 1.1 cm.
IMPRESSION: Normal-sized heterogeneous hyperemic gland.

Stable left isthmic nodule does not meet criteria for biopsy or
dedicated follow-up.

The above is in keeping with the ACR TI-RADS recommendations - [HOSPITAL] 0080;[DATE].

## 2018-07-11 ENCOUNTER — Other Ambulatory Visit: Payer: Self-pay | Admitting: Family Medicine

## 2018-07-11 DIAGNOSIS — J309 Allergic rhinitis, unspecified: Secondary | ICD-10-CM

## 2018-07-15 ENCOUNTER — Encounter: Payer: Self-pay | Admitting: Internal Medicine

## 2018-07-15 ENCOUNTER — Ambulatory Visit: Payer: PPO | Admitting: Internal Medicine

## 2018-07-15 VITALS — BP 142/80 | HR 80 | Temp 96.4°F | Ht 64.0 in | Wt 168.8 lb

## 2018-07-15 DIAGNOSIS — K589 Irritable bowel syndrome without diarrhea: Secondary | ICD-10-CM | POA: Diagnosis not present

## 2018-07-15 DIAGNOSIS — F458 Other somatoform disorders: Secondary | ICD-10-CM | POA: Diagnosis not present

## 2018-07-15 NOTE — Patient Instructions (Signed)
GERD information  IBS information provided  Continue Protonix (pantoprazole ) 40 mg twice daily  Office visit in 3 months

## 2018-07-15 NOTE — Progress Notes (Signed)
Primary Care Physician:  Alycia Rossetti, MD Primary Gastroenterologist:  Dr. Gala Romney  Pre-Procedure History & Physical: HPI:  Jill Roach is a 67 y.o. female here for follow-up of globus/reflux.  She has been on Protonix 40 mg twice daily for nearly 3 months.  She states her globus sensation is improved dramatically.  She is quite pleased.  She notes one other issue that occasionally has strong urge to have a bowel movement after eating close calls getting to the toilet in time.  No bleeding.  The vast majority of time, however, she has normal bowel function.  Negative colonoscopy (diverticulosis) March 2018.  Past Medical History:  Diagnosis Date  . Allergy   . Cataract   . GERD (gastroesophageal reflux disease)   . Hyperlipidemia   . Hypertension   . PONV (postoperative nausea and vomiting)   . Thyroid disease     Past Surgical History:  Procedure Laterality Date  . ambulatory esophageal 24 hour pH study  2004   within normal range. Dr. Johnathan Hausen  . COLONOSCOPY N/A 11/04/2016   Procedure: COLONOSCOPY;  Surgeon: Daneil Dolin, MD;  Location: [facility];  Service: Endoscopy;  Laterality: N/A;  10:30am  . ESOPHAGEAL MANOMETRY  2004   Dr. Johnathan Hausen: normal  . ESOPHAGOGASTRODUODENOSCOPY  2003   Dr. Johnathan Hausen: normal   . ESOPHAGOGASTRODUODENOSCOPY N/A 11/04/2016   Procedure: ESOPHAGOGASTRODUODENOSCOPY (EGD);  Surgeon: Daneil Dolin, MD;  Location: [facility];  Service: Endoscopy;  Laterality: N/A;  . EYE SURGERY     cataracts  . FRACTURE SURGERY     cataract surgery   . LIGAMENT REPAIR     rt knee  . MALONEY DILATION N/A 11/04/2016   Procedure: Venia Minks DILATION;  Surgeon: Daneil Dolin, MD;  Location: [facility];  Service: Endoscopy;  Laterality: N/A;  . TUBAL LIGATION      Prior to Admission medications   Medication Sig Start Date End Date Taking? Authorizing Provider  Calcium-Magnesium-Vitamin D (CALCIUM 1200+D3 PO) Take 1 tablet by mouth  daily.   Yes [provider]  fluticasone (FLONASE) 50 MCG/ACT nasal spray Place 2 sprays into both nostrils daily. 11/29/17  Yes Delsa Grana, PA-C  levocetirizine (XYZAL) 5 MG tablet Take 1 tablet (5 mg total) by mouth every evening. 04/19/18  Yes Delsa Grana, PA-C  levothyroxine (SYNTHROID, LEVOTHROID) 88 MCG tablet TAKE 1 TABLET BY MOUTH DAILY 01/14/18  Yes [place], Modena Nunnery, MD  lisinopril-hydrochlorothiazide (PRINZIDE,ZESTORETIC) 20-12.5 MG tablet TAKE ONE TABLET BY MOUTH ONCE DAILY 05/04/17  Yes [place], Modena Nunnery, MD  montelukast (SINGULAIR) 10 MG tablet TAKE 1 TABLET BY MOUTH AT BEDTIME 07/11/18  Yes Delsa Grana, PA-C  pantoprazole (PROTONIX) 40 MG tablet Take 40 mg by mouth 2 (two) times daily. 06/21/18  Yes [provider]  traZODone (DESYREL) 50 MG tablet Take 0.5-1 tablets (25-50 mg total) by mouth at bedtime as needed for sleep. 05/10/18  Yes , Modena Nunnery, MD  cetirizine (ZYRTEC) 10 MG tablet Take 1 tablet (10 mg total) by mouth daily. Patient not taking: Reported on 07/15/2018 04/05/18   Delsa Grana, PA-C  dexlansoprazole (DEXILANT) 60 MG capsule Take 60 mg by mouth daily.    [provider]  mometasone (NASONEX) 50 MCG/ACT nasal spray Place 2 sprays into the nose daily. Patient not taking: Reported on 07/15/2018 04/05/18   Delsa Grana, PA-C  Omega 3 1200 MG CAPS Take by mouth.    [provider]    Allergies  as of 07/15/2018 - Review Complete 07/15/2018  Allergen Reaction Noted  . Codeine Nausea Only 09/20/2015    Family History  Problem Relation Age of Onset  . Hypertension Mother   . Arthritis Father        Rheumatoid   . Diabetes Father   . Hypertension Father   . Thyroid disease Father   . Arthritis Brother        Rheumatoid  . Thyroid disease Brother   . Heart disease Maternal Grandmother        stomach cancer  . Hypertension Maternal Grandmother   . Cancer Paternal Grandmother        LEUKEMIA/LUNG CNACER,  . Thyroid disease  Paternal Grandmother   . Hypertension Paternal Grandmother   . Cancer Other   . Mental illness Other   . Cancer Maternal Uncle   . Thyroid disease Paternal Aunt   . Colon cancer Neg Hx     Social History   Socioeconomic History  . Marital status: Divorced    Spouse name: Not on file  . Number of children: Not on file  . Years of education: Not on file  . Highest education level: Not on file  Occupational History  . Not on file  Social Needs  . Financial resource strain: Not on file  . Food insecurity:    Worry: Not on file    Inability: Not on file  . Transportation needs:    Medical: Not on file    Non-medical: Not on file  Tobacco Use  . Smoking status: Never Smoker  . Smokeless tobacco: Never Used  Substance and Sexual Activity  . Alcohol use: No  . Drug use: No  . Sexual activity: Not Currently  Lifestyle  . Physical activity:    Days per week: Not on file    Minutes per session: Not on file  . Stress: Not on file  Relationships  . Social connections:    Talks on phone: Not on file    Gets together: Not on file    Attends religious service: Not on file    Active member of club or organization: Not on file    Attends meetings of clubs or organizations: Not on file    Relationship status: Not on file  . Intimate partner violence:    Fear of current or ex partner: Not on file    Emotionally abused: Not on file    Physically abused: Not on file    Forced sexual activity: Not on file  Other Topics Concern  . Not on file  Social History Narrative  . Not on file    Review of Systems: See HPI, otherwise negative ROS  Physical Exam: BP (!) 142/80   Pulse 80   Temp (!) 96.4 F (35.8 C) (Oral)   Ht 5\' 4"  (1.626 m)   Wt 168 lb 12.8 oz (76.6 kg)   BMI 28.97 kg/m  General:   Alert,  Well-developed, well-nourished, pleasant and cooperative in NAD SNeck:  Supple; no masses or thyromegaly. No significant cervical adenopathy. Lungs:  Clear throughout to  auscultation.   No wheezes, crackles, or rhonchi. No acute distress. Heart:  Regular rate and rhythm; no murmurs, clicks, rubs,  or gallops. Abdomen: Non-distended, normal bowel sounds.  Soft and nontender without appreciable mass or hepatosplenomegaly.  Pulses:  Normal pulses noted. Extremities:  Without clubbing or edema.  Impression/Plan: 67 year old lady with a history of GERD and globus now much improved on twice daily PPI  therapy of 3 months duration.  She would likely derive additional benefit from staying on high-dose acid suppression therapy for another 3 months prior to contemplating de-escalation therapy.  Very infrequent strong postprandial urges to have a bowel movement.  Likely represents an element of irritable bowel syndrome.  No alarm features.  This was discussed at length.  No further evaluation needed at this time.  Recommendations:  GERD information  IBS information provided  Continue Protonix (pantoprazole ) 40 mg twice daily  Office visit in 3 months      Notice: This dictation was prepared with Dragon dictation along with smaller phrase technology. Any transcriptional errors that result from this process are unintentional and may not be corrected upon review.

## 2018-07-20 ENCOUNTER — Other Ambulatory Visit: Payer: Self-pay | Admitting: Family Medicine

## 2018-08-04 ENCOUNTER — Encounter: Payer: Self-pay | Admitting: Family Medicine

## 2018-08-04 ENCOUNTER — Ambulatory Visit (INDEPENDENT_AMBULATORY_CARE_PROVIDER_SITE_OTHER): Payer: PPO | Admitting: Family Medicine

## 2018-08-04 VITALS — BP 130/84 | HR 78 | Temp 98.2°F | Resp 15 | Ht 64.0 in | Wt 170.0 lb

## 2018-08-04 DIAGNOSIS — J0100 Acute maxillary sinusitis, unspecified: Secondary | ICD-10-CM

## 2018-08-04 MED ORDER — AMOXICILLIN-POT CLAVULANATE 875-125 MG PO TABS: 1.0000 | ORAL_TABLET | Freq: Two times a day (BID) | ORAL | 0 refills | Status: AC

## 2018-08-04 NOTE — Progress Notes (Signed)
Patient ID: Jill Roach, female    DOB: 11-Sep-1950, 67 y.o.   MRN: [id number]  PCP: Alycia Rossetti, MD  Chief Complaint  Patient presents with  . Sinusitis    Patient has c/o scratchy throat, post nasal drip, nasal congestion. Onset 1 week ago    Subjective:   Jill Roach is a 67 y.o. female, presents to clinic with CC of 1-2 weeks of URI sx, scratchy throat and voice, sinus pain, pressure, congestion, and profuse nasal drainage.  She has pressure in her bilateral cheeks nose and behind her eyes.  Her symptoms have gotten much worse over the past several days.  She denies any cough, fever but she does feel generalized malaise and feels ill, much more so over the past 4 days, clearly with increased pain and achiness to her sinuses with worse congestion..  She has been taking Mucinex but is not helping much.  She does use daily nasal allergy medications and is continued to take them.   Patient Active Problem List   Diagnosis Date Noted  . Reflux esophagitis 02/04/2017  . Colon cancer screening 10/05/2016  . Globus sensation 10/05/2016  . Osteopenia 09/28/2016  . Obesity 03/24/2016  . Chronic insomnia 03/24/2016  . Arthritis 09/25/2015  . Hypertension   . Hyperlipidemia   . Hypothyroidism      Prior to Admission medications   Medication Sig Start Date End Date Taking? Authorizing Provider  Calcium-Magnesium-Vitamin D (CALCIUM 1200+D3 PO) Take 1 tablet by mouth daily.   Yes [provider]  cetirizine (ZYRTEC) 10 MG tablet Take 1 tablet (10 mg total) by mouth daily. 04/05/18  Yes Delsa Grana, PA-C  dexlansoprazole (DEXILANT) 60 MG capsule Take 60 mg by mouth daily.   Yes [provider]  fluticasone (FLONASE) 50 MCG/ACT nasal spray Place 2 sprays into both nostrils daily. 11/29/17  Yes Delsa Grana, PA-C  levocetirizine (XYZAL) 5 MG tablet Take 1 tablet (5 mg total) by mouth every evening. 04/19/18  Yes Delsa Grana, PA-C  levothyroxine (SYNTHROID,  LEVOTHROID) 88 MCG tablet TAKE 1 TABLET BY MOUTH ONCE DAILY 07/20/18  Yes [place], Modena Nunnery, MD  lisinopril-hydrochlorothiazide (PRINZIDE,ZESTORETIC) 20-12.5 MG tablet TAKE ONE TABLET BY MOUTH ONCE DAILY 05/04/17  Yes [place], Modena Nunnery, MD  mometasone (NASONEX) 50 MCG/ACT nasal spray Place 2 sprays into the nose daily. 04/05/18  Yes Delsa Grana, PA-C  montelukast (SINGULAIR) 10 MG tablet TAKE 1 TABLET BY MOUTH AT BEDTIME 07/11/18  Yes Yaileen Hofferber, PA-C  Omega 3 1200 MG CAPS Take by mouth.   Yes [provider]  pantoprazole (PROTONIX) 40 MG tablet Take 40 mg by mouth 2 (two) times daily. 06/21/18  Yes [provider]  traZODone (DESYREL) 50 MG tablet Take 0.5-1 tablets (25-50 mg total) by mouth at bedtime as needed for sleep. 05/10/18  Yes [place], Modena Nunnery, MD     Allergies  Allergen Reactions  . Codeine Nausea Only     Family History  Problem Relation Age of Onset  . Hypertension Mother   . Arthritis Father        Rheumatoid   . Diabetes Father   . Hypertension Father   . Thyroid disease Father   . Arthritis Brother        Rheumatoid  . Thyroid disease Brother   . Heart disease Maternal Grandmother        stomach cancer  . Hypertension Maternal Grandmother   . Cancer Paternal Grandmother  LEUKEMIA/LUNG CNACER,  . Thyroid disease Paternal Grandmother   . Hypertension Paternal Grandmother   . Cancer Other   . Mental illness Other   . Cancer Maternal Uncle   . Thyroid disease Paternal Aunt   . Colon cancer Neg Hx      Social History   Socioeconomic History  . Marital status: Divorced    Spouse name: Not on file  . Number of children: Not on file  . Years of education: Not on file  . Highest education level: Not on file  Occupational History  . Not on file  Social Needs  . Financial resource strain: Not on file  . Food insecurity:    Worry: Not on file    Inability: Not on file  . Transportation needs:    Medical: Not on file     Non-medical: Not on file  Tobacco Use  . Smoking status: Never Smoker  . Smokeless tobacco: Never Used  Substance and Sexual Activity  . Alcohol use: No  . Drug use: No  . Sexual activity: Not Currently  Lifestyle  . Physical activity:    Days per week: Not on file    Minutes per session: Not on file  . Stress: Not on file  Relationships  . Social connections:    Talks on phone: Not on file    Gets together: Not on file    Attends religious service: Not on file    Active member of club or organization: Not on file    Attends meetings of clubs or organizations: Not on file    Relationship status: Not on file  . Intimate partner violence:    Fear of current or ex partner: Not on file    Emotionally abused: Not on file    Physically abused: Not on file    Forced sexual activity: Not on file  Other Topics Concern  . Not on file  Social History Narrative  . Not on file     Review of Systems  Constitutional: Negative.   HENT: Negative.   Eyes: Negative.   Respiratory: Negative.   Cardiovascular: Negative.   Gastrointestinal: Negative.   Endocrine: Negative.   Genitourinary: Negative.   Musculoskeletal: Negative.   Skin: Negative.   Allergic/Immunologic: Negative.   Neurological: Negative.   Hematological: Negative.   Psychiatric/Behavioral: Negative.   All other systems reviewed and are negative.      Objective:    Vitals:   08/04/18 1057  BP: 130/84  Pulse: 78  Resp: 15  Temp: 98.2 F (36.8 C)  TempSrc: Oral  SpO2: 97%  Weight: 170 lb (77.1 kg)  Height: 5\' 4"  (1.626 m)      Physical Exam Vitals signs and nursing note reviewed.  Constitutional:      General: She is not in acute distress.    Appearance: She is well-developed. She is ill-appearing (mildly ill). She is not toxic-appearing or diaphoretic.  HENT:     Head: Normocephalic and atraumatic.     Jaw: There is normal jaw occlusion.     Right Ear: Hearing, tympanic membrane, ear canal and  external ear normal.     Left Ear: Hearing, tympanic membrane, ear canal and external ear normal.     Nose: Mucosal edema, congestion and rhinorrhea present. No nasal deformity.     Right Nostril: Occlusion present.     Right Turbinates: Enlarged and swollen.     Left Turbinates: Enlarged and swollen.     Right Sinus: Maxillary  sinus tenderness present. No frontal sinus tenderness.     Left Sinus: Maxillary sinus tenderness present. No frontal sinus tenderness.     Mouth/Throat:     Mouth: Mucous membranes are moist. Mucous membranes are not pale.     Pharynx: Uvula midline. Posterior oropharyngeal erythema present. No oropharyngeal exudate or uvula swelling.     Tonsils: No tonsillar abscesses.  Eyes:     General:        Right eye: No discharge.        Left eye: No discharge.     Conjunctiva/sclera: Conjunctivae normal.     Pupils: Pupils are equal, round, and reactive to light.  Neck:     Musculoskeletal: Normal range of motion and neck supple. No neck rigidity.     Trachea: No tracheal deviation.  Cardiovascular:     Rate and Rhythm: Normal rate and regular rhythm.     Pulses: Normal pulses.     Heart sounds: Normal heart sounds. No murmur. No friction rub. No gallop.   Pulmonary:     Effort: Pulmonary effort is normal. No respiratory distress.     Breath sounds: No stridor. No wheezing, rhonchi or rales.  Abdominal:     General: Bowel sounds are normal. There is no distension.     Palpations: Abdomen is soft.  Musculoskeletal: Normal range of motion.  Lymphadenopathy:     Cervical: No cervical adenopathy.  Skin:    General: Skin is warm and dry.     Coloration: Skin is not pale.     Findings: No rash.  Neurological:     Mental Status: She is alert.     Motor: No abnormal muscle tone.     Coordination: Coordination normal.  Psychiatric:        Behavior: Behavior normal.           Assessment & Plan:      ICD-10-CM   1. Acute non-recurrent maxillary sinusitis  J39.58     67 year old female presents with URI symptoms that acutely worsened 4 days ago particularly to her bilateral maxillary sinuses with congestion pain and pressure.  Presentation and physical exam are consistent with acute bacterial sinusitis we will treat with Augmentin.  Encouraged to continue her daily nasal allergy medications follow-up as needed if not gradually improved in 1 to 2 weeks   Delsa Grana, PA-C 08/04/18 11:04 AM

## 2018-08-04 NOTE — Patient Instructions (Signed)
Take antibiotics and continue daily regimen for nasal symptoms -steroid nasal spray, antihistamines and montelukast  Back to [place] if you have any difficulty with the antibiotic or develop a yeast infection.  I would supplement with probiotics and do take the antibiotic with something in her stomach like crackers

## 2018-08-11 ENCOUNTER — Telehealth: Payer: Self-pay | Admitting: Family Medicine

## 2018-08-11 DIAGNOSIS — J309 Allergic rhinitis, unspecified: Secondary | ICD-10-CM

## 2018-08-11 MED ORDER — PREDNISONE 20 MG PO TABS: ORAL_TABLET | ORAL | 0 refills | Status: DC

## 2018-08-11 MED ORDER — FLUTICASONE PROPIONATE 50 MCG/ACT NA SUSP: 2.0000 | Freq: Every day | NASAL | 6 refills | Status: DC

## 2018-08-11 MED ORDER — CETIRIZINE HCL 10 MG PO TABS: 10.0000 mg | ORAL_TABLET | Freq: Every day | ORAL | 11 refills | Status: DC

## 2018-08-11 NOTE — Telephone Encounter (Signed)
She could use an over the counter decongestant like sudafed - if she tolerates it.   She should call her ENT and follow up with him, because she is maxed out on allergy/sinus medicines.  The only other thing she could do is switch up her antihistamines and steroid nasal sprays.  Or we can try a short steroid burst with her getting OTC decongestants.

## 2018-08-11 NOTE — Telephone Encounter (Signed)
Spoke with patient and informed her of that we sent in Steriod taper, and flonase. Patient verbalized understanding. Also informed her of recommendations made by [facility]

## 2018-08-11 NOTE — Telephone Encounter (Signed)
Patient is still sick from last visit, she said leisa mentioned a decongestant if needed, she thinks she needs it.  Could we send to her pharmacy? [facility] [place]

## 2018-08-14 ENCOUNTER — Other Ambulatory Visit: Payer: Self-pay | Admitting: Family Medicine

## 2018-08-30 ENCOUNTER — Other Ambulatory Visit: Payer: Self-pay | Admitting: Family Medicine

## 2018-09-20 ENCOUNTER — Encounter: Payer: Self-pay | Admitting: Internal Medicine

## 2018-09-20 ENCOUNTER — Other Ambulatory Visit: Payer: Self-pay | Admitting: Family Medicine

## 2018-10-21 ENCOUNTER — Other Ambulatory Visit: Payer: Self-pay | Admitting: Family Medicine

## 2018-10-21 DIAGNOSIS — J309 Allergic rhinitis, unspecified: Secondary | ICD-10-CM

## 2018-11-03 ENCOUNTER — Telehealth: Payer: Self-pay

## 2018-11-03 NOTE — Telephone Encounter (Signed)
Pt called office, she is due for f/u OV. She has been taking Pantoprazole 40mg  BID. RMR told her at last OV if she was doing better she can decrease Pantoprazole to once a day. She has some reflux at times, but overall is much better. She wants to know if she needs OV or can she decrease Pantoprazole.  Routing to LSL in RMR's absence.

## 2018-11-04 NOTE — Telephone Encounter (Signed)
Ok to drop to once daily pantoprazole. Continue antireflux measures.   OV in 3 months with RMR>

## 2018-11-07 NOTE — Telephone Encounter (Signed)
Called and informed pt. OV made with RMR.

## 2018-11-24 ENCOUNTER — Other Ambulatory Visit: Payer: Self-pay | Admitting: Family Medicine

## 2018-11-24 DIAGNOSIS — J309 Allergic rhinitis, unspecified: Secondary | ICD-10-CM

## 2018-12-01 ENCOUNTER — Telehealth: Payer: Self-pay | Admitting: *Deleted

## 2018-12-01 NOTE — Telephone Encounter (Signed)
Received call from patient. [phone]~ telephone.   Reports that she has been experiencing burning under her toes for months. States that she has some slight pain, but the burning sensation is more concerning.   Reports that she has broken said toes in past.   Appointment scheduled for Televisit.

## 2018-12-02 ENCOUNTER — Ambulatory Visit (INDEPENDENT_AMBULATORY_CARE_PROVIDER_SITE_OTHER): Payer: PPO | Admitting: Family Medicine

## 2018-12-02 DIAGNOSIS — M79671 Pain in right foot: Secondary | ICD-10-CM | POA: Diagnosis not present

## 2018-12-02 DIAGNOSIS — M79672 Pain in left foot: Secondary | ICD-10-CM

## 2018-12-02 NOTE — Progress Notes (Signed)
Virtual Visit via Telephone Note  I connected with Jill Roach on 12/02/18 at  12:16 by telephone and verified that I am speaking with the correct person using two identifiers. Pt location: at home Physician location: at home, Vic Blackbird MD  On call- Patient and Jill Roach    I discussed the limitations, risks, security and privacy concerns of performing an evaluation and management service by telephone and the availability of in person appointments. I also discussed with the patient that there may be a patient responsible charge related to this service. The patient expressed understanding and agreed to proceed.   History of Present Illness: Patient complains of bilat feet pain, states her toes feel like they are fire at times, burning and tingling, when she gets up to walk around. Denies and swelling, but states she has some knots on her toes as well. This has been present for past few months. She noticed it when she would go dancing. Has taken some tylenol and aleve with a little improvement Denies back pain or radiating symptoms occ feet look red   Observations/Objective: Unable to observe  Assessment and Plan: Foot pain- unable to tell if this is OA, neuroma PAD, Neuropathy, will have patient come in office Monday AM, will obtain fasting labs including blood sugar, check pulses, check BP Check Back for signs of impingment   Follow Up Instructions:    I discussed the assessment and treatment plan with the patient. The patient was provided an opportunity to ask questions and all were answered. The patient agreed with the plan and demonstrated an understanding of the instructions.   The patient was advised to call back or seek an in-person evaluation if the symptoms worsen or if the condition fails to improve as anticipated.  I provided 11 minutes of non-face-to-face time during this encounter. End time 12:27  Vic Blackbird, MD

## 2018-12-04 ENCOUNTER — Encounter: Payer: Self-pay | Admitting: Family Medicine

## 2018-12-05 ENCOUNTER — Other Ambulatory Visit: Payer: Self-pay

## 2018-12-05 ENCOUNTER — Encounter: Payer: Self-pay | Admitting: Family Medicine

## 2018-12-05 ENCOUNTER — Ambulatory Visit (INDEPENDENT_AMBULATORY_CARE_PROVIDER_SITE_OTHER): Payer: PPO | Admitting: Family Medicine

## 2018-12-05 VITALS — BP 150/88 | HR 72 | Temp 97.9°F | Resp 16 | Wt 175.3 lb

## 2018-12-05 DIAGNOSIS — L84 Corns and callosities: Secondary | ICD-10-CM

## 2018-12-05 DIAGNOSIS — G5793 Unspecified mononeuropathy of bilateral lower limbs: Secondary | ICD-10-CM | POA: Diagnosis not present

## 2018-12-05 DIAGNOSIS — E038 Other specified hypothyroidism: Secondary | ICD-10-CM | POA: Diagnosis not present

## 2018-12-05 DIAGNOSIS — I10 Essential (primary) hypertension: Secondary | ICD-10-CM | POA: Diagnosis not present

## 2018-12-05 DIAGNOSIS — E782 Mixed hyperlipidemia: Secondary | ICD-10-CM

## 2018-12-05 NOTE — Patient Instructions (Signed)
F/u pending

## 2018-12-05 NOTE — Progress Notes (Signed)
Subjective:    Patient ID: Jill Roach, female    DOB: 01-05-1951, 68 y.o.   MRN: [id number]  Patient presents for Foot Pain See telephone visit from Friday. "Patient complains of bilat feet pain, states her toes feel like they are fire at times, burning and tingling, when she gets up to walk around. Denies and swelling, but states she has some knots on her toes as well. This has been present for past few months. She noticed it when she would go dancing. Has taken some tylenol and aleve with a little improvement Denies back pain or radiating symptoms occ feet look red" She has had multiple broken toes throughout the years, are deformed especially on the right foot.  She does get some rubbing on 1 of her her toes. Asked to come in to have lab work done to rule out diabetes also to check her pulses for signs of peripheral arterial disease and check her back to make sure there is no sign of nerve impingement. She has not taken any of her medications this morning including the blood pressure medications.  She was also very anxious in the office- asking about COVID  Review Of Systems:  GEN- denies fatigue, fever, weight loss,weakness, recent illness HEENT- denies eye drainage, change in vision, nasal discharge, CVS- denies chest pain, palpitations RESP- denies SOB, cough, wheeze ABD- denies N/V, change in stools, abd pain GU- denies dysuria, hematuria, dribbling, incontinence MSK- + joint pain, DENIES muscle aches, injury Neuro- denies headache, dizziness, syncope, seizure activity       Objective:    BP (!) 150/88   Pulse 72   Temp 97.9 F (36.6 C)   Resp 16   Wt 175 lb 4.8 oz (79.5 kg)   SpO2 98%   BMI 30.09 kg/m  GEN- NAD, alert and oriented x3 CVS- RRR, no murmur RESP-CTAB MSK- Lumbar spine NT, neg SLR, Fair ROM spine Neuro- motor equal bilat LE, sensation in tact, normal tone LE , normal gait  EXT- No edema, feet warm to touch, normal coloration, callus bilat, corns  bilat  Right foot3rd digit with lateral callus- deformity of 3rd and 4th toe-rubbing  small nodule on 2nd digit right foot Pulses- Radial, DP- 2+,mild diminished left foot compared to right , PT in tact        Assessment & Plan:      Problem List Items Addressed This Visit      Unprioritized   Hyperlipidemia    Check lipids      Relevant Orders   Lipid panel   Hypertension - Primary    Blood pressure quite elevated in office however she has not taken her medication and she was quite anxious about being out in the setting of COVID.  She will resume her regular blood pressure medications I am checking metabolic panel      Relevant Orders   CBC with Differential/Platelet   Comprehensive metabolic panel   Hemoglobin A1c   Lipid panel   Hypothyroidism   Relevant Orders   TSH   T4, free   T3, free    Other Visit Diagnoses    Neuropathy of both feet       symptoms consistent with neuropathy though with her history of broken toes, likely has OA in feet as well, PAD on differential but more neuropathic symptoms. Check to make sure she is not diabetic.  We will also check a B12 level.  Based on the results we will likely try  gabapentin 100 mg at bedtime.  I did feel fairly good pulses today.  She does not have any back pathology noted on exam today.   Relevant Orders   Hemoglobin A1c   Vitamin B12   Callus of foot       she has deformity of the toes but no classic sign of neuroma, would benefit fromt reatment of callus and possible spacers, surgery could fix the crooked toes, just going to podiatry however will defer this for couple months in the setting of the COVID-19      Note: This dictation was prepared with Dragon dictation along with smaller phrase technology. Any transcriptional errors that result from this process are unintentional.

## 2018-12-05 NOTE — Assessment & Plan Note (Addendum)
Check lipids 

## 2018-12-05 NOTE — Assessment & Plan Note (Signed)
Blood pressure quite elevated in office however she has not taken her medication and she was quite anxious about being out in the setting of COVID.  She will resume her regular blood pressure medications I am checking metabolic panel

## 2018-12-06 LAB — CBC WITH DIFFERENTIAL/PLATELET
Absolute Monocytes: 620 cells/uL (ref 200–950)
Basophils Absolute: 59 cells/uL (ref 0–200)
Basophils Relative: 1 %
Eosinophils Absolute: 100 cells/uL (ref 15–500)
Eosinophils Relative: 1.7 %
HCT: 38.7 % (ref 35.0–45.0)
Hemoglobin: 13.3 g/dL (ref 11.7–15.5)
Lymphs Abs: 2159 cells/uL (ref 850–3900)
MCH: 28.9 pg (ref 27.0–33.0)
MCHC: 34.4 g/dL (ref 32.0–36.0)
MCV: 84.1 fL (ref 80.0–100.0)
MPV: 10.3 fL (ref 7.5–12.5)
Monocytes Relative: 10.5 %
Neutro Abs: 2962 cells/uL (ref 1500–7800)
Neutrophils Relative %: 50.2 %
Platelets: 275 10*3/uL (ref 140–400)
RBC: 4.6 10*6/uL (ref 3.80–5.10)
RDW: 13.5 % (ref 11.0–15.0)
Total Lymphocyte: 36.6 %
WBC: 5.9 10*3/uL (ref 3.8–10.8)

## 2018-12-06 LAB — LIPID PANEL
Cholesterol: 130 mg/dL (ref <200)
HDL: 43 mg/dL — ABNORMAL LOW (ref >=50)
LDL Cholesterol (Calc): 66 mg/dL (calc)
Non-HDL Cholesterol (Calc): 87 mg/dL (calc) (ref <130)
Total CHOL/HDL Ratio: 3 (calc) (ref <5.0)
Triglycerides: 133 mg/dL (ref <150)

## 2018-12-06 LAB — TSH: TSH: 1 mIU/L (ref 0.40–4.50)

## 2018-12-06 LAB — COMPREHENSIVE METABOLIC PANEL
AG Ratio: 1.7 (calc) (ref 1.0–2.5)
ALT: 14 U/L (ref 6–29)
AST: 15 U/L (ref 10–35)
Albumin: 4.5 g/dL (ref 3.6–5.1)
Alkaline phosphatase (APISO): 70 U/L (ref 37–153)
BUN: 20 mg/dL (ref 7–25)
CO2: 26 mmol/L (ref 20–32)
Calcium: 9.6 mg/dL (ref 8.6–10.4)
Chloride: 105 mmol/L (ref 98–110)
Creat: 0.7 mg/dL (ref 0.50–0.99)
Globulin: 2.7 g/dL (calc) (ref 1.9–3.7)
Glucose, Bld: 103 mg/dL — ABNORMAL HIGH (ref 65–99)
Potassium: 4.2 mmol/L (ref 3.5–5.3)
Sodium: 139 mmol/L (ref 135–146)
Total Bilirubin: 0.7 mg/dL (ref 0.2–1.2)
Total Protein: 7.2 g/dL (ref 6.1–8.1)

## 2018-12-06 LAB — HEMOGLOBIN A1C
Hgb A1c MFr Bld: 5.2 % of total Hgb (ref <5.7)
Mean Plasma Glucose: 103 (calc)
eAG (mmol/L): 5.7 (calc)

## 2018-12-06 LAB — VITAMIN B12: Vitamin B-12: 147 pg/mL — ABNORMAL LOW (ref 200–1100)

## 2018-12-06 LAB — T3, FREE: T3, Free: 3.2 pg/mL (ref 2.3–4.2)

## 2018-12-06 LAB — T4, FREE: Free T4: 1.1 ng/dL (ref 0.8–1.8)

## 2018-12-07 ENCOUNTER — Ambulatory Visit (INDEPENDENT_AMBULATORY_CARE_PROVIDER_SITE_OTHER): Payer: PPO | Admitting: Family Medicine

## 2018-12-07 ENCOUNTER — Other Ambulatory Visit: Payer: Self-pay

## 2018-12-07 DIAGNOSIS — E538 Deficiency of other specified B group vitamins: Secondary | ICD-10-CM | POA: Diagnosis not present

## 2018-12-07 MED ORDER — CYANOCOBALAMIN 1000 MCG/ML IJ SOLN
1000.0000 ug | INTRAMUSCULAR | Status: DC
  Administered 2018-12-07 – 2019-10-02 (×9): 1000 ug via INTRAMUSCULAR

## 2019-01-11 ENCOUNTER — Other Ambulatory Visit: Payer: Self-pay

## 2019-01-11 ENCOUNTER — Ambulatory Visit (INDEPENDENT_AMBULATORY_CARE_PROVIDER_SITE_OTHER): Payer: PPO | Admitting: Family Medicine

## 2019-01-11 DIAGNOSIS — E538 Deficiency of other specified B group vitamins: Secondary | ICD-10-CM | POA: Diagnosis not present

## 2019-01-18 ENCOUNTER — Other Ambulatory Visit: Payer: Self-pay | Admitting: Family Medicine

## 2019-01-18 DIAGNOSIS — J309 Allergic rhinitis, unspecified: Secondary | ICD-10-CM

## 2019-01-31 ENCOUNTER — Ambulatory Visit: Payer: PPO | Admitting: Internal Medicine

## 2019-02-05 NOTE — Progress Notes (Signed)
Referring Provider: Alycia Rossetti, MD Primary Care Physician:  Alycia Rossetti, MD  Primary GI: Dr. Gala Romney  Chief Complaint  Patient presents with  . Gastroesophageal Reflux    HPI:   Jill Roach is a 68 y.o. female presenting today for follow-up. History of GERD and globus. Last seen in office on 07/15/18 for GERD follow-up and occasional postpraandial urgency with BMs. Patient was doing well overall and recommended to continue Protonix 40mg  BID for another 3 months.   Colonoscopy in 2018 with diverticulosis. Otherwise normal.  EGD 2018 with LA Grade B esophagitis s/p dilation, normal stomach and duodenum.  BPE 2018 with moderate to marked esophageal dysmotility.  Was seen by ENT in September 2019, felt component of LPR  Patient had called in March 2020 asking if she could decrease her Protonix to daily. This was permitted.   Today she states she occasionally will "feel a little something in her throat", but not nearly as bad as it used to be. No reflux, heartburn, epigastric pain, nausea or vomiting. No dysphagia. No abdominal pain. BMs are ok. Occasional post parndial diarrhea. Salads and greasy foods are triggers. Otherwise, formed BM every other day. No melena, no hematochezia. No unintentional weight loss.   Had some fatigue. States B12 was really low. Now getting B-12 shots.   Denies fever, chills, unintentional weight loss, lightheadedness, dizziness.   Past Medical History:  Diagnosis Date  . Allergy   . Cataract   . GERD (gastroesophageal reflux disease)   . Hyperlipidemia   . Hypertension   . PONV (postoperative nausea and vomiting)   . Thyroid disease     Past Surgical History:  Procedure Laterality Date  . ambulatory esophageal 24 hour pH study  2004   within normal range. Dr. Johnathan Hausen  . COLONOSCOPY N/A 11/04/2016   Procedure: COLONOSCOPY;  Surgeon: Daneil Dolin, MD;  Location: [facility];  Service: Endoscopy;  Laterality: N/A;  10:30am   . ESOPHAGEAL MANOMETRY  2004   Dr. Johnathan Hausen: normal  . ESOPHAGOGASTRODUODENOSCOPY  2003   Dr. Johnathan Hausen: normal   . ESOPHAGOGASTRODUODENOSCOPY N/A 11/04/2016   Procedure: ESOPHAGOGASTRODUODENOSCOPY (EGD);  Surgeon: Daneil Dolin, MD;  Location: [facility];  Service: Endoscopy;  Laterality: N/A;  . EYE SURGERY     cataracts  . FRACTURE SURGERY     cataract surgery   . LIGAMENT REPAIR     rt knee  . MALONEY DILATION N/A 11/04/2016   Procedure: Venia Minks DILATION;  Surgeon: Daneil Dolin, MD;  Location: [facility];  Service: Endoscopy;  Laterality: N/A;  . TUBAL LIGATION      Current Outpatient Medications  Medication Sig Dispense Refill  . Calcium-Magnesium-Vitamin D (CALCIUM 1200+D3 PO) Take 1 tablet by mouth daily.    . cetirizine (ZYRTEC) 10 MG tablet Take 1 tablet (10 mg total) by mouth daily. 30 tablet 11  . fluticasone (FLONASE) 50 MCG/ACT nasal spray Place 2 sprays into both nostrils daily. 16 g 6  . levothyroxine (SYNTHROID, LEVOTHROID) 88 MCG tablet TAKE 1 TABLET BY MOUTH ONCE DAILY 90 tablet 0  . lisinopril-hydrochlorothiazide (PRINZIDE,ZESTORETIC) 20-12.5 MG tablet Take 1 tablet by mouth once daily 90 tablet 0  . montelukast (SINGULAIR) 10 MG tablet TAKE 1 TABLET BY MOUTH AT BEDTIME 90 tablet 0  . pantoprazole (PROTONIX) 40 MG tablet Take 40 mg by mouth daily.   11  . traZODone (DESYREL) 50 MG tablet Take 0.5-1 tablets (25-50 mg total) by mouth  at bedtime as needed for sleep. 30 tablet 6   Current Facility-Administered Medications  Medication Dose Route Frequency Provider Last Rate Last Dose  . cyanocobalamin ((VITAMIN B-12)) injection 1,000 mcg  1,000 mcg Intramuscular Q30 days Vic Blackbird F, MD   1,000 mcg at 01/11/19 1158    Allergies as of 02/06/2019 - Review Complete 02/06/2019  Allergen Reaction Noted  . Codeine Nausea Only 09/20/2015    Family History  Problem Relation Age of Onset  . Hypertension Mother   . Arthritis Father         Rheumatoid   . Diabetes Father   . Hypertension Father   . Thyroid disease Father   . Arthritis Brother        Rheumatoid  . Thyroid disease Brother   . Heart disease Maternal Grandmother        stomach cancer  . Hypertension Maternal Grandmother   . Cancer Paternal Grandmother        LEUKEMIA/LUNG CNACER,  . Thyroid disease Paternal Grandmother   . Hypertension Paternal Grandmother   . Cancer Other   . Mental illness Other   . Cancer Maternal Uncle   . Thyroid disease Paternal Aunt   . Colon cancer Neg Hx     Social History   Socioeconomic History  . Marital status: Divorced    Spouse name: Not on file  . Number of children: Not on file  . Years of education: Not on file  . Highest education level: Not on file  Occupational History  . Not on file  Social Needs  . Financial resource strain: Not on file  . Food insecurity    Worry: Not on file    Inability: Not on file  . Transportation needs    Medical: Not on file    Non-medical: Not on file  Tobacco Use  . Smoking status: Never Smoker  . Smokeless tobacco: Never Used  Substance and Sexual Activity  . Alcohol use: No  . Drug use: No  . Sexual activity: Not Currently  Lifestyle  . Physical activity    Days per week: Not on file    Minutes per session: Not on file  . Stress: Not on file  Relationships  . Social Herbalist on phone: Not on file    Gets together: Not on file    Attends religious service: Not on file    Active member of club or organization: Not on file    Attends meetings of clubs or organizations: Not on file    Relationship status: Not on file  Other Topics Concern  . Not on file  Social History Narrative  . Not on file    Review of Systems: Gen: Denies anorexia, weakness  CV: Denies palpitations, syncope, peripheral edema Resp: Denies dyspnea at rest. Admits to occasional dry cough for years. Attributes to allergies. GI: See HPI Derm: Denies rash, itching, dry skin  Psych: Denies depression, anxiety, memory loss, confusion. No homicidal or suicidal ideation.  Heme: Denies bruising, bleeding, and enlarged lymph nodes. Thinks she is bleeding more than normal.   Physical Exam: BP (!) 161/83   Pulse 80   Temp 98.4 F (36.9 C) (Oral)   Ht 5' 4.5" (1.638 m)   Wt 176 lb 6.4 oz (80 kg)   BMI 29.81 kg/m  General:   Alert and oriented. No distress noted. Pleasant and cooperative.  Head:  Normocephalic and atraumatic. Eyes:  Conjuctiva clear without scleral icterus. Mouth:  Oral mucosa pink and moist. Good dentition. No lesions. Heart:  S1, S2 present without murmurs appreciated. Lungs:  Clear to auscultation bilaterally. No wheezes, rales, or rhonchi. No distress.  Abdomen:  +BS, soft, non-tender and non-distended. No rebound or guarding. No HSM or masses noted. Msk:  Symmetrical without gross deformities. Normal posture. Extremities:  Without edema. Neurologic:  Alert and  oriented x4 Psych:  Alert and cooperative. Normal mood and affect.

## 2019-02-06 ENCOUNTER — Other Ambulatory Visit: Payer: Self-pay

## 2019-02-06 ENCOUNTER — Ambulatory Visit: Payer: PPO | Admitting: Gastroenterology

## 2019-02-06 ENCOUNTER — Encounter: Payer: Self-pay | Admitting: Gastroenterology

## 2019-02-06 DIAGNOSIS — R0989 Other specified symptoms and signs involving the circulatory and respiratory systems: Secondary | ICD-10-CM

## 2019-02-06 DIAGNOSIS — R198 Other specified symptoms and signs involving the digestive system and abdomen: Secondary | ICD-10-CM

## 2019-02-06 NOTE — Patient Instructions (Signed)
1. Continue taking your Protonix 40 mg daily 30 minutes prior to breakfast.   2. We will see you back in 6 months. If you have any concerns prior to your next visit, you can always call.   I am glad you are doing well!  Aliene Altes, PA-C [facility]

## 2019-02-06 NOTE — Assessment & Plan Note (Addendum)
68 year old female with history of globus sensation. Has had extensive workup in the past dating back to 2003 with manometry and ambulatory esophageal 24 hour pH monitoring that was negative. In 2018 with BPE showing esophageal dysmotility and EGD with esophagitis and empiric dilation. ENT evaluationin 2019 who felt component of LPR. Patient had been on Protonix BID from September 2019 to March 2020. She was doing well at that time and decreased Protonix to once daily in March. She continues to do well with minimal symptoms.   Continue Protonix 40mg  daily 30 minutes before breakfast.  If symptoms begin to worsen again, she will likely need to increase Protonix to BID again.  Follow-up in 6 months.

## 2019-02-07 NOTE — Progress Notes (Signed)
CC'D TO PCP °

## 2019-02-16 ENCOUNTER — Ambulatory Visit: Payer: PPO

## 2019-02-17 ENCOUNTER — Ambulatory Visit (INDEPENDENT_AMBULATORY_CARE_PROVIDER_SITE_OTHER): Payer: PPO

## 2019-02-17 ENCOUNTER — Other Ambulatory Visit: Payer: Self-pay

## 2019-02-17 DIAGNOSIS — E538 Deficiency of other specified B group vitamins: Secondary | ICD-10-CM | POA: Diagnosis not present

## 2019-02-17 NOTE — Progress Notes (Signed)
Patient came in to receive her monthly B12 injection. She received B12 injection in her left deltoid. Patient tolerated well. Advised to return in 30 day for next injection.

## 2019-02-26 ENCOUNTER — Other Ambulatory Visit: Payer: Self-pay | Admitting: Family Medicine

## 2019-02-26 DIAGNOSIS — J309 Allergic rhinitis, unspecified: Secondary | ICD-10-CM

## 2019-02-27 NOTE — Telephone Encounter (Signed)
Requested Prescriptions   Pending Prescriptions Disp Refills  . traZODone (DESYREL) 50 MG tablet [Pharmacy Med Name: traZODone HCl 50 MG Oral Tablet] 30 tablet 0    Sig: TAKE 1/2 TO 1 (ONE-HALF TO ONE) TABLET BY MOUTH AT BEDTIME AS NEEDED FOR SLEEP   Signed Prescriptions Disp Refills  . montelukast (SINGULAIR) 10 MG tablet 90 tablet 1    Sig: TAKE 1 TABLET BY MOUTH AT BEDTIME    Authorizing Provider: Alycia Rossetti    Ordering User: Dyke Maes C     Last OV 12/05/2018  Last written 05/10/2018

## 2019-03-13 ENCOUNTER — Other Ambulatory Visit: Payer: Self-pay

## 2019-03-14 ENCOUNTER — Encounter: Payer: Self-pay | Admitting: Family Medicine

## 2019-03-14 ENCOUNTER — Ambulatory Visit (INDEPENDENT_AMBULATORY_CARE_PROVIDER_SITE_OTHER): Payer: PPO | Admitting: Family Medicine

## 2019-03-14 VITALS — BP 138/64 | HR 82 | Temp 98.7°F | Resp 14 | Ht 64.5 in | Wt 177.0 lb

## 2019-03-14 DIAGNOSIS — E538 Deficiency of other specified B group vitamins: Secondary | ICD-10-CM

## 2019-03-14 DIAGNOSIS — S39012A Strain of muscle, fascia and tendon of lower back, initial encounter: Secondary | ICD-10-CM

## 2019-03-14 MED ORDER — MELOXICAM 7.5 MG PO TABS: 7.5000 mg | ORAL_TABLET | Freq: Every day | ORAL | 0 refills | Status: DC

## 2019-03-14 MED ORDER — TIZANIDINE HCL 4 MG PO TABS: 4.0000 mg | ORAL_TABLET | Freq: Three times a day (TID) | ORAL | 0 refills | Status: DC | PRN

## 2019-03-14 NOTE — Progress Notes (Signed)
    Subjective:    Patient ID: Jill Roach, female    DOB: [birth year], 68 y.o.   MRN: [id number]  Patient presents for R Sided Back Pain (x1 week- pain in R side of back around bra strap and radiates towards spine) and VIt B Injection    Pt here right mid thoracic back pain , below bra strap line and radiates around to the front for the past week and half. More of a sorness. When she gets up out of the bed has more pain and feels a stabbing pain. Once she starts walkin around more, feels better  Sunday rode to [place] and when she got out of the car, the pain came back.    No change in bowel or bladder  no tinglin or numbess in lower ext  She took advil/ibuprofen a few times helped a little bit   No recent injury, but has been ducking under a tarp to get to her vegetable garden   Due for Vitamin B injections    Review Of Systems:  GEN- denies fatigue, fever, weight loss,weakness, recent illness HEENT- denies eye drainage, change in vision, nasal discharge, CVS- denies chest pain, palpitations RESP- denies SOB, cough, wheeze ABD- denies N/V, change in stools, abd pain GU- denies dysuria, hematuria, dribbling, incontinence MSK- +joint pain, muscle aches, injury Neuro- denies headache, dizziness, syncope, seizure activity       Objective:    BP 138/64   Pulse 82   Temp 98.7 F (37.1 C) (Oral)   Resp 14   Ht 5' 4.5" (1.638 m)   Wt 177 lb (80.3 kg)   SpO2 98%   BMI 29.91 kg/m  GEN- NAD, alert and oriented x3 HEENT- PERRL, EOMI, non injected sclera, pink conjunctiva, MMM, oropharynx clear CVS- RRR, no murmur RESP-CTAB ABD-NABS,soft,NT,ND MSK- mild TTP right paraspinals, mild spasm, Spine NT, fair ROM spine, hips/knees Neuro- normal tone stregnth UE and Lower ext, sensation in tact  EXT- No edema Pulses- Radial, DP- 2+        Assessment & Plan:      Problem List Items Addressed This Visit    None    Visit Diagnoses    Back strain, initial  encounter    -  Primary   Treat as MSK strain, given zanaflex, mobic, use heating pad, no sign of shingles on exam but discussed red flags for this, if not improved 2 weeks, image      Note: This dictation was prepared with Dragon dictation along with smaller phrase technology. Any transcriptional errors that result from this process are unintentional.

## 2019-03-14 NOTE — Patient Instructions (Addendum)
Take mobic once a day with food Use muscle relaxer Call if it not improved in 2 weeks  F/U 4 months for Physical

## 2019-04-09 ENCOUNTER — Other Ambulatory Visit: Payer: Self-pay | Admitting: Family Medicine

## 2019-04-21 ENCOUNTER — Telehealth: Payer: Self-pay | Admitting: Family Medicine

## 2019-04-21 MED ORDER — FLUCONAZOLE 150 MG PO TABS: 150.0000 mg | ORAL_TABLET | Freq: Once | ORAL | 0 refills | Status: AC

## 2019-04-21 NOTE — Telephone Encounter (Signed)
Patient called in with c/o of possible yeast infection. Patient states that she has vaginal burning onset 1 week ago.She denies urinary symptoms. Has tried monistat over the counter. Would like to know if we can call in diflucan. Please advise?

## 2019-04-21 NOTE — Telephone Encounter (Signed)
Okay to send diflucan 41mo po x 1 dose, repeat in 3 days  #2

## 2019-04-21 NOTE — Telephone Encounter (Signed)
Spoke with patient and informed her that we called in [place]. Patient verbalized understanding.

## 2019-04-25 ENCOUNTER — Other Ambulatory Visit: Payer: Self-pay

## 2019-04-25 ENCOUNTER — Encounter: Payer: Self-pay | Admitting: Family Medicine

## 2019-04-25 ENCOUNTER — Ambulatory Visit (INDEPENDENT_AMBULATORY_CARE_PROVIDER_SITE_OTHER): Payer: PPO | Admitting: Family Medicine

## 2019-04-25 VITALS — BP 134/82 | HR 96 | Temp 98.5°F | Resp 14 | Ht 64.5 in | Wt 174.0 lb

## 2019-04-25 DIAGNOSIS — M5137 Other intervertebral disc degeneration, lumbosacral region: Secondary | ICD-10-CM

## 2019-04-25 DIAGNOSIS — N760 Acute vaginitis: Secondary | ICD-10-CM | POA: Diagnosis not present

## 2019-04-25 DIAGNOSIS — R350 Frequency of micturition: Secondary | ICD-10-CM | POA: Diagnosis not present

## 2019-04-25 DIAGNOSIS — E538 Deficiency of other specified B group vitamins: Secondary | ICD-10-CM | POA: Diagnosis not present

## 2019-04-25 DIAGNOSIS — M51379 Other intervertebral disc degeneration, lumbosacral region without mention of lumbar back pain or lower extremity pain: Secondary | ICD-10-CM | POA: Insufficient documentation

## 2019-04-25 DIAGNOSIS — Z23 Encounter for immunization: Secondary | ICD-10-CM

## 2019-04-25 DIAGNOSIS — R319 Hematuria, unspecified: Secondary | ICD-10-CM

## 2019-04-25 DIAGNOSIS — K6289 Other specified diseases of anus and rectum: Secondary | ICD-10-CM | POA: Diagnosis not present

## 2019-04-25 DIAGNOSIS — N390 Urinary tract infection, site not specified: Secondary | ICD-10-CM

## 2019-04-25 DIAGNOSIS — N952 Postmenopausal atrophic vaginitis: Secondary | ICD-10-CM

## 2019-04-25 DIAGNOSIS — E539 Vitamin B deficiency, unspecified: Secondary | ICD-10-CM | POA: Diagnosis not present

## 2019-04-25 LAB — MICROSCOPIC MESSAGE

## 2019-04-25 LAB — WET PREP FOR TRICH, YEAST, CLUE

## 2019-04-25 LAB — URINALYSIS, ROUTINE W REFLEX MICROSCOPIC
Bacteria, UA: NONE SEEN /HPF
Bilirubin Urine: NEGATIVE
Glucose, UA: NEGATIVE
Hyaline Cast: NONE SEEN /LPF
Ketones, ur: NEGATIVE
Nitrite: NEGATIVE
Protein, ur: NEGATIVE
Specific Gravity, Urine: 1.02 (ref 1.001–1.03)
pH: 7 (ref 5.0–8.0)

## 2019-04-25 MED ORDER — NITROFURANTOIN MONOHYD MACRO 100 MG PO CAPS: 100.0000 mg | ORAL_CAPSULE | Freq: Two times a day (BID) | ORAL | 0 refills | Status: DC

## 2019-04-25 MED ORDER — ESTRADIOL 0.1 MG/GM VA CREA: 1.0000 | TOPICAL_CREAM | VAGINAL | 3 refills | Status: DC

## 2019-04-25 MED ORDER — MELOXICAM 7.5 MG PO TABS: 7.5000 mg | ORAL_TABLET | Freq: Every day | ORAL | 0 refills | Status: DC

## 2019-04-25 MED ORDER — TIZANIDINE HCL 4 MG PO TABS: 4.0000 mg | ORAL_TABLET | Freq: Three times a day (TID) | ORAL | 0 refills | Status: DC | PRN

## 2019-04-25 MED ORDER — HYDROCORTISONE 2.5 % EX CREA: TOPICAL_CREAM | Freq: Two times a day (BID) | CUTANEOUS | 1 refills | Status: DC

## 2019-04-25 NOTE — Progress Notes (Signed)
Subjective:    Patient ID: Jill Roach, female    DOB: 1951-07-07, 68 y.o.   MRN: [id number]  Patient presents for Yeast Infection (itching and burning in vaginal canal and around anus- has used monistat 3), Spinal Pain (burning to bottom of spine that radiates to L cheek), and Dysuria (flank pain and urinary refequency) Pt here with dysuria,for past week, also had right flank pain that resolved with use of cranberry juice No gross hematuria, no abd pain, no N/V/Fever  Vaginal itching which is been going on for a few weeks.  She tried over-the-counter Monistat a few times she then called in last Friday was given Diflucan does help a little bit but none of this helped the rectal itching.  She denies any change in her bowels denies any blood in the stool.  She has had no significant discharge she is not sexually active.  She is postmenopausal., No Vaginal bleeding.   Acute on chronic back pain, has history of DDD in lower back and tailbone, states she broke tail bone years ago, has flares of pain with burning sensation into her left cheek, recent flare started the day after her last visit, when she was here for upper back pain.     Review Of Systems:  GEN- denies fatigue, fever, weight loss,weakness, recent illness HEENT- denies eye drainage, change in vision, nasal discharge, CVS- denies chest pain, palpitations RESP- denies SOB, cough, wheeze ABD- denies N/V, change in stools, abd pain GU- + dysuria, hematuria, dribbling, incontinence MSK-+ joint pain, muscle aches, injury Neuro- denies headache, dizziness, syncope, seizure activity       Objective:    BP 134/82   Pulse 96   Temp 98.5 F (36.9 C) (Oral)   Resp 14   Ht 5' 4.5" (1.638 m)   Wt 174 lb (78.9 kg)   SpO2 98%   BMI 29.41 kg/m  GEN- NAD, alert and oriented x3 HEENT- PERRL, EOMI, non injected sclera, pink conjunctiva, MMM, oropharynx clear CVS- RRR, no murmur RESP-CTAB ABD-NABS,soft,NT,ND, no CVA tenderness   GU- normal external genitalia with atrophy, vaginal mucosa pink and moist, ,cervix no lesions minimal thin clear discharge, no CMT, no ovarian masses, uterus normal size Rectum normal tone, no erythema no ulcerations seen externally  MSK- Mild TTP lumbar spine and over SI region left side, neg SLR, fair ROM EXT- No edema Pulses- Radial 2+        Assessment & Plan:      Problem List Items Addressed This Visit      Unprioritized   DDD (degenerative disc disease), lumbosacral    She has chronic back pain.  We discussed treating her other issues first and seeing if that improves her lower back pain is a started the past couple of weeks as well.  She can use the muscle spasm medicine the meloxicam as needed.  Next that would be orthopedics and repeat imaging as I do not actually have any imaging on the lumbar spine.  For the rectal itching will have her use some hydrocortisone I do not see any sign of infection on the skin.  Just general irritation.  She also has atrophic vaginitis her wet prep was negative she is treated yeast if there was some there.  We will try her on topical estrogen 3 times a week to see if this helps with her symptoms.  Urinalysis was also obtained she had some mild bacteria in the urine and she was symptomatic though some of this  could be confused with the atrophic vaginitis but we will give her Macrobid to take for 5 days.      Relevant Medications   tiZANidine (ZANAFLEX) 4 MG tablet   meloxicam (MOBIC) 7.5 MG tablet    Other Visit Diagnoses    Urinary tract infection with hematuria, site unspecified    -  Primary   Relevant Medications   nitrofurantoin, macrocrystal-monohydrate, (MACROBID) 100 MG capsule   Other Relevant Orders   Urinalysis, Routine w reflex microscopic (Completed)   Atrophic vaginitis       Relevant Orders   WET PREP FOR [place], YEAST, CLUE (Completed)   Rectal irritation       Need for immunization against influenza       Relevant  Orders   Flu Vaccine QUAD High Dose(Fluad) (Completed)   Vitamin B deficiency       B12 shot given today      Note: This dictation was prepared with Dragon dictation along with smaller phrase technology. Any transcriptional errors that result from this process are unintentional.

## 2019-04-25 NOTE — Patient Instructions (Addendum)
Restart meloxicam and zanaflex It not improved referral to orthopedics  Use vaginal cream- Estrace three times a week Use the cortisone around rectal area twice a day Take antibiotics as prescribed for UTI  Schedule physical

## 2019-04-26 ENCOUNTER — Encounter: Payer: Self-pay | Admitting: Family Medicine

## 2019-04-26 NOTE — Assessment & Plan Note (Signed)
She has chronic back pain.  We discussed treating her other issues first and seeing if that improves her lower back pain is a started the past couple of weeks as well.  She can use the muscle spasm medicine the meloxicam as needed.  Next that would be orthopedics and repeat imaging as I do not actually have any imaging on the lumbar spine.  For the rectal itching will have her use some hydrocortisone I do not see any sign of infection on the skin.  Just general irritation.  She also has atrophic vaginitis her wet prep was negative she is treated yeast if there was some there.  We will try her on topical estrogen 3 times a week to see if this helps with her symptoms.  Urinalysis was also obtained she had some mild bacteria in the urine and she was symptomatic though some of this could be confused with the atrophic vaginitis but we will give her Macrobid to take for 5 days.

## 2019-05-01 ENCOUNTER — Other Ambulatory Visit: Payer: Self-pay | Admitting: *Deleted

## 2019-05-01 MED ORDER — ESTRADIOL 0.1 MG/GM VA CREA: 1.0000 | TOPICAL_CREAM | VAGINAL | 3 refills | Status: DC

## 2019-05-04 ENCOUNTER — Telehealth: Payer: Self-pay | Admitting: *Deleted

## 2019-05-04 ENCOUNTER — Other Ambulatory Visit: Payer: Self-pay | Admitting: *Deleted

## 2019-05-04 DIAGNOSIS — N952 Postmenopausal atrophic vaginitis: Secondary | ICD-10-CM

## 2019-05-04 DIAGNOSIS — K6289 Other specified diseases of anus and rectum: Secondary | ICD-10-CM

## 2019-05-04 NOTE — Telephone Encounter (Signed)
Received call from patient.   Reports that she continues to have increased vaginal burning and rectal irritation. Referral placed to GYN, but patient is requesting MD to advise if there is anything else she can try.   MD please advise.

## 2019-05-05 NOTE — Telephone Encounter (Signed)
I dont know of anything else to give her She has used the topical anti-fungal, the steroids in rectal region  the estrogen  Her swabs were okay   I recommend GYN - see if Jill Roach can get urgent appt since this has been going on a few weeks with no relief

## 2019-05-05 NOTE — Telephone Encounter (Signed)
Spoke with patient and informed her that Dr. Buelah Manis does not have any further recommendations. Also informed patient that we are processing an urgent referral to [facility]. Patient verbalized understanding.

## 2019-05-08 ENCOUNTER — Other Ambulatory Visit: Payer: Self-pay | Admitting: Family Medicine

## 2019-05-08 ENCOUNTER — Telehealth: Payer: Self-pay | Admitting: Family Medicine

## 2019-05-08 DIAGNOSIS — J309 Allergic rhinitis, unspecified: Secondary | ICD-10-CM

## 2019-05-08 NOTE — Telephone Encounter (Signed)
Pt called back. I gave her [place]'s recommendation to wait and see what GYN says. Pt states that she is miserable and she also verbalizes understanding.

## 2019-05-08 NOTE — Telephone Encounter (Signed)
Patient called in stating that she is scheduled with Dr. Elonda Husky at [facility] on 05/16/2019. She has now developed a discharge and would like to know if we can call something in until she see's Dr.Eure. Please advise?

## 2019-05-08 NOTE — Telephone Encounter (Signed)
Tried to call no answer, voicemail box is full will attempt to contact patient again later

## 2019-05-08 NOTE — Telephone Encounter (Signed)
This would need to be seen first, I have already treated for yeast infection, atrophic vaginitis, inflammation skin Nothing has helped The GYN will check the discharge,that is what they do.She can call and see if they have earlier appointment

## 2019-05-10 ENCOUNTER — Telehealth: Payer: Self-pay | Admitting: *Deleted

## 2019-05-10 DIAGNOSIS — M5137 Other intervertebral disc degeneration, lumbosacral region: Secondary | ICD-10-CM

## 2019-05-10 NOTE — Telephone Encounter (Signed)
Received call from patient.   Reports that she continues to have pain in lower back with burning sensation.   Per chart notes, next step for back pain ins ortho referral. Referral orders placed.

## 2019-05-12 ENCOUNTER — Telehealth: Payer: Self-pay | Admitting: *Deleted

## 2019-05-12 MED ORDER — CIPROFLOXACIN HCL 500 MG PO TABS: 500.0000 mg | ORAL_TABLET | Freq: Two times a day (BID) | ORAL | 0 refills | Status: DC

## 2019-05-12 MED ORDER — METHYLPREDNISOLONE 4 MG PO TBPK: ORAL_TABLET | ORAL | 0 refills | Status: DC

## 2019-05-12 NOTE — Telephone Encounter (Signed)
Take Cipro 500mg  BID x 7 days for UTI symptoms For back, if still with burning like sensation, she can take mEdrol dosepak- this is for inflammation   stop the meloxicam while on this medication  Also can use Ultram prn pain

## 2019-05-12 NOTE — Telephone Encounter (Signed)
Received call from patient [phone]~ telephone.   Reports that she continues to have increased urine output and lower abd pressure. States that she does not feel that the UTI has resolved.   Also reports that burning pain to lower spine continues and she can't see Ortho for 2 weeks. Requested MD to advise if there is any other treatment she can do at this time.

## 2019-05-12 NOTE — Telephone Encounter (Signed)
Call placed to patient and patient made aware.   Prescription sent to pharmacy.  

## 2019-05-15 ENCOUNTER — Telehealth: Payer: Self-pay | Admitting: Obstetrics & Gynecology

## 2019-05-15 NOTE — Telephone Encounter (Signed)

## 2019-05-16 ENCOUNTER — Other Ambulatory Visit: Payer: Self-pay

## 2019-05-16 ENCOUNTER — Encounter

## 2019-05-16 ENCOUNTER — Ambulatory Visit: Payer: PPO | Admitting: Obstetrics & Gynecology

## 2019-05-16 ENCOUNTER — Encounter: Payer: Self-pay | Admitting: Obstetrics & Gynecology

## 2019-05-16 VITALS — BP 176/80 | HR 100 | Ht 61.5 in | Wt 170.0 lb

## 2019-05-16 DIAGNOSIS — L900 Lichen sclerosus et atrophicus: Secondary | ICD-10-CM

## 2019-05-16 MED ORDER — FLUCONAZOLE 100 MG PO TABS: 100.0000 mg | ORAL_TABLET | Freq: Every day | ORAL | 0 refills | Status: DC

## 2019-05-16 NOTE — Progress Notes (Signed)
Chief Complaint  Patient presents with  . Burning at vagina and rectal area      68 y.o. [id number] No LMP recorded. Patient is postmenopausal. The current method of family planning is post menopausal status.  Outpatient Encounter Medications as of 05/16/2019  Medication Sig  . Calcium-Magnesium-Vitamin D (CALCIUM 1200+D3 PO) Take 1 tablet by mouth daily.  . cetirizine (ZYRTEC) 10 MG tablet Take 1 tablet (10 mg total) by mouth daily.  . ciprofloxacin (CIPRO) 500 MG tablet Take 1 tablet (500 mg total) by mouth 2 (two) times daily.  . fluticasone (FLONASE) 50 MCG/ACT nasal spray Place 2 sprays into both nostrils daily.  Marland Kitchen levocetirizine (XYZAL) 5 MG tablet TAKE 1 TABLET BY MOUTH ONCE DAILY IN THE EVENING  . levothyroxine (SYNTHROID, LEVOTHROID) 88 MCG tablet TAKE 1 TABLET BY MOUTH ONCE DAILY  . lisinopril-hydrochlorothiazide (ZESTORETIC) 20-12.5 MG tablet Take 1 tablet by mouth once daily  . methylPREDNISolone (MEDROL DOSEPAK) 4 MG TBPK tablet Use as directed on package.  . montelukast (SINGULAIR) 10 MG tablet TAKE 1 TABLET BY MOUTH AT BEDTIME  . pantoprazole (PROTONIX) 40 MG tablet Take 40 mg by mouth daily.   . traZODone (DESYREL) 50 MG tablet TAKE 1/2 TO 1 (ONE-HALF TO ONE) TABLET BY MOUTH AT BEDTIME AS NEEDED FOR SLEEP  . estradiol (ESTRACE VAGINAL) 0.1 MG/GM vaginal cream Place 1 Applicatorful vaginally 3 (three) times a week. (Patient not taking: Reported on 05/16/2019)  . fluconazole (DIFLUCAN) 100 MG tablet Take 1 tablet (100 mg total) by mouth daily.  . hydrocortisone 2.5 % cream Apply topically 2 (two) times daily. Around rectal area (Patient not taking: Reported on 05/16/2019)  . meloxicam (MOBIC) 7.5 MG tablet Take 1 tablet (7.5 mg total) by mouth daily. (Patient not taking: Reported on 05/16/2019)  . tiZANidine (ZANAFLEX) 4 MG tablet Take 1 tablet (4 mg total) by mouth every 8 (eight) hours as needed for muscle spasms. (Patient not taking: Reported on 05/16/2019)    Facility-Administered Encounter Medications as of 05/16/2019  Medication  . cyanocobalamin ((VITAMIN B-12)) injection 1,000 mcg    Subjective Pt with 3 weeks of vulvovagina/perianal itching burning Used OTC monistat Used Rx diflucan Placed on meds for UTI culture is negative No incontinence, does not wear a pad Worse at night Past Medical History:  Diagnosis Date  . Allergy   . Cataract   . GERD (gastroesophageal reflux disease)   . Hyperlipidemia   . Hypertension   . PONV (postoperative nausea and vomiting)   . Thyroid disease     Past Surgical History:  Procedure Laterality Date  . ambulatory esophageal 24 hour pH study  2004   within normal range. Dr. Johnathan Hausen  . COLONOSCOPY N/A 11/04/2016   Procedure: COLONOSCOPY;  Surgeon: Daneil Dolin, MD;  [facility];  Service: Endoscopy;  Laterality: N/A;  10:30am  . ESOPHAGEAL MANOMETRY  2004   Dr. Johnathan Hausen: normal  . ESOPHAGOGASTRODUODENOSCOPY  2003   Dr. Johnathan Hausen: normal   . ESOPHAGOGASTRODUODENOSCOPY N/A 11/04/2016   Procedure: ESOPHAGOGASTRODUODENOSCOPY (EGD);  Surgeon: Daneil Dolin, MD;  [facility]: [facility];  Service: Endoscopy;  Laterality: N/A;  . EYE SURGERY     cataracts  . FRACTURE SURGERY     cataract surgery   . LIGAMENT REPAIR     rt knee  . MALONEY DILATION N/A 11/04/2016   Procedure: Venia Minks DILATION;  Surgeon: Daneil Dolin, MD;  [facility];  Service: Endoscopy;  Laterality:  N/A;  . TUBAL LIGATION      OB History    Gravida  3   Para  3   Term  3   Preterm      AB      Living  3     SAB      TAB      Ectopic      Multiple      Live Births  3           Allergies  Allergen Reactions  . Codeine Nausea Only    Social History   Socioeconomic History  . Marital status: Divorced    Spouse name: Not on file  . Number of children: Not on file  . Years of education: Not on file  . Highest education level: Not on file  Occupational  History  . Not on file  Social Needs  . Financial resource strain: Not on file  . Food insecurity    Worry: Not on file    Inability: Not on file  . Transportation needs    Medical: Not on file    Non-medical: Not on file  Tobacco Use  . Smoking status: Never Smoker  . Smokeless tobacco: Never Used  Substance and Sexual Activity  . Alcohol use: No  . Drug use: No  . Sexual activity: Not Currently  Lifestyle  . Physical activity    Days per week: Not on file    Minutes per session: Not on file  . Stress: Not on file  Relationships  . Social Herbalist on phone: Not on file    Gets together: Not on file    Attends religious service: Not on file    Active member of club or organization: Not on file    Attends meetings of clubs or organizations: Not on file    Relationship status: Not on file  Other Topics Concern  . Not on file  Social History Narrative  . Not on file    Family History  Problem Relation Age of Onset  . Hypertension Mother   . Arthritis Father        Rheumatoid   . Diabetes Father   . Hypertension Father   . Thyroid disease Father   . Arthritis Brother        Rheumatoid  . Thyroid disease Brother   . Heart disease Maternal Grandmother        stomach cancer  . Hypertension Maternal Grandmother   . Cancer Paternal Grandmother        LEUKEMIA/LUNG CNACER,  . Thyroid disease Paternal Grandmother   . Hypertension Paternal Grandmother   . Cancer Other   . Mental illness Other   . Cancer Maternal Uncle   . Thyroid disease Paternal Aunt   . Colon cancer Neg Hx     Medications:       Current Outpatient Medications:  .  Calcium-Magnesium-Vitamin D (CALCIUM 1200+D3 PO), Take 1 tablet by mouth daily., Disp: , Rfl:  .  cetirizine (ZYRTEC) 10 MG tablet, Take 1 tablet (10 mg total) by mouth daily., Disp: 30 tablet, Rfl: 11 .  ciprofloxacin (CIPRO) 500 MG tablet, Take 1 tablet (500 mg total) by mouth 2 (two) times daily., Disp: 14 tablet, Rfl: 0  .  fluticasone (FLONASE) 50 MCG/ACT nasal spray, Place 2 sprays into both nostrils daily., Disp: 16 g, Rfl: 6 .  levocetirizine (XYZAL) 5 MG tablet, TAKE 1 TABLET BY MOUTH ONCE DAILY IN  THE EVENING, Disp: 90 tablet, Rfl: 0 .  levothyroxine (SYNTHROID, LEVOTHROID) 88 MCG tablet, TAKE 1 TABLET BY MOUTH ONCE DAILY, Disp: 90 tablet, Rfl: 0 .  lisinopril-hydrochlorothiazide (ZESTORETIC) 20-12.5 MG tablet, Take 1 tablet by mouth once daily, Disp: 90 tablet, Rfl: 1 .  methylPREDNISolone (MEDROL DOSEPAK) 4 MG TBPK tablet, Use as directed on package., Disp: 21 tablet, Rfl: 0 .  montelukast (SINGULAIR) 10 MG tablet, TAKE 1 TABLET BY MOUTH AT BEDTIME, Disp: 90 tablet, Rfl: 1 .  pantoprazole (PROTONIX) 40 MG tablet, Take 40 mg by mouth daily. , Disp: , Rfl: 11 .  traZODone (DESYREL) 50 MG tablet, TAKE 1/2 TO 1 (ONE-HALF TO ONE) TABLET BY MOUTH AT BEDTIME AS NEEDED FOR SLEEP, Disp: 30 tablet, Rfl: 1 .  estradiol (ESTRACE VAGINAL) 0.1 MG/GM vaginal cream, Place 1 Applicatorful vaginally 3 (three) times a week. (Patient not taking: Reported on 05/16/2019), Disp: 42.5 g, Rfl: 3 .  fluconazole (DIFLUCAN) 100 MG tablet, Take 1 tablet (100 mg total) by mouth daily., Disp: 7 tablet, Rfl: 0 .  hydrocortisone 2.5 % cream, Apply topically 2 (two) times daily. Around rectal area (Patient not taking: Reported on 05/16/2019), Disp: 30 g, Rfl: 1 .  meloxicam (MOBIC) 7.5 MG tablet, Take 1 tablet (7.5 mg total) by mouth daily. (Patient not taking: Reported on 05/16/2019), Disp: 30 tablet, Rfl: 0 .  tiZANidine (ZANAFLEX) 4 MG tablet, Take 1 tablet (4 mg total) by mouth every 8 (eight) hours as needed for muscle spasms. (Patient not taking: Reported on 05/16/2019), Disp: 20 tablet, Rfl: 0  Current Facility-Administered Medications:  .  cyanocobalamin ((VITAMIN B-12)) injection 1,000 mcg, 1,000 mcg, Intramuscular, Q30 days, Dudley, Kawanta F, MD, 1,000 mcg at 04/25/19 1344  Objective Blood pressure (!) 176/80, pulse 100, height 5'  1.5" (1.562 m), weight 170 lb (77.1 kg).  General WDWN female NAD Vulva:  normal appearing vulva with no masses, tenderness or lesions Vagina:  normal mucosa, no discharge Cervix:  Normal no lesions Uterus:  normal size, contour, position, consistency, mobility, non-tender Adnexa: ovaries:present,  normal adnexa in size, nontender and no masses Treated with gentian violet today vagina vulva and perirectal areas   Pertinent ROS No burning with urination, frequency or urgency No nausea, vomiting or diarrhea Nor fever chills or other constitutional symptoms   Labs or studies Reviewed labs    Impression Diagnoses this Encounter::   0000000   1. 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 fluconazole (DIFLUCAN) 100 MG tablet    Sig: Take 1 tablet (100 mg total) by mouth daily.    Dispense:  7 tablet    Refill:  0    Labs or Scans Ordered: No orders of the defined types were placed in this encounter.   Management:: >probable inflammatory condition, LSA vs reaction to topical products >will take yeast out of equation with GV and diflcuan >reassess in 2 weeks  Follow up Return in about 2 weeks (around 05/30/2019) for Follow up, with Dr Elonda Husky.      All questions were answered.

## 2019-05-19 ENCOUNTER — Ambulatory Visit: Payer: PPO

## 2019-05-19 ENCOUNTER — Ambulatory Visit: Payer: PPO | Admitting: Orthopedic Surgery

## 2019-05-19 ENCOUNTER — Other Ambulatory Visit: Payer: Self-pay

## 2019-05-19 VITALS — BP 167/91 | HR 78 | Temp 97.2°F | Ht 64.0 in | Wt 168.0 lb

## 2019-05-19 DIAGNOSIS — M5440 Lumbago with sciatica, unspecified side: Secondary | ICD-10-CM

## 2019-05-19 MED ORDER — PREDNISONE 10 MG (48) PO TBPK: ORAL_TABLET | Freq: Every day | ORAL | 0 refills | Status: DC

## 2019-05-19 NOTE — Progress Notes (Signed)
Patient ID: Jill Roach, female   DOB: 06/09/1951, 68 y.o.   MRN: [id number]  Chief Complaint  Patient presents with  . Tailbone Pain    Back pain.    HPI Jill Roach is a 68 y.o. female.  Dionne Milo 68 years old she presents to [place] with lower back pain.  She says she fractured her tailbone several years ago and has had intermittent discomfort over the years but after doing some cleaning where she was bending over a lot she pulled some muscles and went to see the doctor.  She was placed on meloxicam and tizanidine but it did not help.  After the examination she said she felt a lot of burning down there and was then placed on prednisone which basically relieved all of her symptoms  Currently she is asymptomatic.  She denies any leg plain or bowel or bladder dysfunction   Review of Systems Review of Systems  Psychiatric/Behavioral: Negative.     Past Medical History:  Diagnosis Date  . Allergy   . Cataract   . GERD (gastroesophageal reflux disease)   . Hyperlipidemia   . Hypertension   . PONV (postoperative nausea and vomiting)   . Thyroid disease     Past Surgical History:  Procedure Laterality Date  . ambulatory esophageal 24 hour pH study  2004   within normal range. Dr. Johnathan Hausen  . COLONOSCOPY N/A 11/04/2016   Procedure: COLONOSCOPY;  Surgeon: Daneil Dolin, MD;  [facility];  Service: Endoscopy;  Laterality: N/A;  10:30am  . ESOPHAGEAL MANOMETRY  2004   Dr. Johnathan Hausen: normal  . ESOPHAGOGASTRODUODENOSCOPY  2003   Dr. Johnathan Hausen: normal   . ESOPHAGOGASTRODUODENOSCOPY N/A 11/04/2016   Procedure: ESOPHAGOGASTRODUODENOSCOPY (EGD);  Surgeon: Daneil Dolin, MD;  [facility];  Service: Endoscopy;  Laterality: N/A;  . EYE SURGERY     cataracts  . FRACTURE SURGERY     cataract surgery   . LIGAMENT REPAIR     rt knee  . MALONEY DILATION N/A 11/04/2016   Procedure: Venia Minks DILATION;  Surgeon: Daneil Dolin, MD;  [facility];   Service: Endoscopy;  Laterality: N/A;  . TUBAL LIGATION      Family History  Problem Relation Age of Onset  . Hypertension Mother   . Arthritis Father        Rheumatoid   . Diabetes Father   . Hypertension Father   . Thyroid disease Father   . Arthritis Brother        Rheumatoid  . Thyroid disease Brother   . Heart disease Maternal Grandmother        stomach cancer  . Hypertension Maternal Grandmother   . Cancer Paternal Grandmother        LEUKEMIA/LUNG CNACER,  . Thyroid disease Paternal Grandmother   . Hypertension Paternal Grandmother   . Cancer Other   . Mental illness Other   . Cancer Maternal Uncle   . Thyroid disease Paternal Aunt   . Colon cancer Neg Hx    was reviewed  Social History Social History   Tobacco Use  . Smoking status: Never Smoker  . Smokeless tobacco: Never Used  Substance Use Topics  . Alcohol use: No  . Drug use: No    Allergies  Allergen Reactions  . Codeine Nausea Only    Current Outpatient Medications  Medication Sig Dispense Refill  . Calcium-Magnesium-Vitamin D (CALCIUM 1200+D3 PO) Take 1 tablet by mouth daily.    Marland Kitchen  cetirizine (ZYRTEC) 10 MG tablet Take 1 tablet (10 mg total) by mouth daily. 30 tablet 11  . levocetirizine (XYZAL) 5 MG tablet TAKE 1 TABLET BY MOUTH ONCE DAILY IN THE EVENING 90 tablet 0  . levothyroxine (SYNTHROID, LEVOTHROID) 88 MCG tablet TAKE 1 TABLET BY MOUTH ONCE DAILY 90 tablet 0  . lisinopril-hydrochlorothiazide (ZESTORETIC) 20-12.5 MG tablet Take 1 tablet by mouth once daily 90 tablet 1  . montelukast (SINGULAIR) 10 MG tablet TAKE 1 TABLET BY MOUTH AT BEDTIME 90 tablet 1  . pantoprazole (PROTONIX) 40 MG tablet Take 40 mg by mouth daily.   11  . traZODone (DESYREL) 50 MG tablet TAKE 1/2 TO 1 (ONE-HALF TO ONE) TABLET BY MOUTH AT BEDTIME AS NEEDED FOR SLEEP 30 tablet 1  . ciprofloxacin (CIPRO) 500 MG tablet Take 1 tablet (500 mg total) by mouth 2 (two) times daily. (Patient not taking: Reported on 05/19/2019) 14  tablet 0  . estradiol (ESTRACE VAGINAL) 0.1 MG/GM vaginal cream Place 1 Applicatorful vaginally 3 (three) times a week. (Patient not taking: Reported on 05/16/2019) 42.5 g 3  . fluconazole (DIFLUCAN) 100 MG tablet Take 1 tablet (100 mg total) by mouth daily. (Patient not taking: Reported on 05/19/2019) 7 tablet 0  . fluticasone (FLONASE) 50 MCG/ACT nasal spray Place 2 sprays into both nostrils daily. (Patient not taking: Reported on 05/19/2019) 16 g 6  . hydrocortisone 2.5 % cream Apply topically 2 (two) times daily. Around rectal area (Patient not taking: Reported on 05/16/2019) 30 g 1  . meloxicam (MOBIC) 7.5 MG tablet Take 1 tablet (7.5 mg total) by mouth daily. (Patient not taking: Reported on 05/16/2019) 30 tablet 0  . methylPREDNISolone (MEDROL DOSEPAK) 4 MG TBPK tablet Use as directed on package. (Patient not taking: Reported on 05/19/2019) 21 tablet 0  . predniSONE (STERAPRED UNI-PAK 48 TAB) 10 MG (48) TBPK tablet Take by mouth daily. 48 tablet 0  . tiZANidine (ZANAFLEX) 4 MG tablet Take 1 tablet (4 mg total) by mouth every 8 (eight) hours as needed for muscle spasms. (Patient not taking: Reported on 05/16/2019) 20 tablet 0   Current Facility-Administered Medications  Medication Dose Route Frequency Provider Last Rate Last Dose  . cyanocobalamin ((VITAMIN B-12)) injection 1,000 mcg  1,000 mcg Intramuscular Q30 days Vic Blackbird F, MD   1,000 mcg at 04/25/19 1344       Physical Exam BP (!) 167/91   Pulse 78   Temp (!) 97.2 F (36.2 C)   Ht 5\' 4"  (1.626 m)   Wt 168 lb (76.2 kg)   BMI 28.84 kg/m  Physical Exam Gen. appearance: The patient is well-developed and well-nourished grooming and hygiene are normal The patient is oriented to person place and time The patient's mood is normal and the affect is normal  Ortho Exam Gait assessment: The patient stands with normal gait and station  Lumbar spine NO Tenderness  to palpation is noted in the lower L4-5 and 5 S1 segment  Range of  motion  FLEXION NORMAL NO PAIN AND EXTENSION NO PAIN  Muscle tone normal muscle tone on the right and left sides of the spine  Lower extremities right and left Normal range of motion hip knee and ankle All 3 joints are reduced and stable  Strength right lower extremity hip flexors knee extensors dorsiflexion plantarflexion normal Strength left lower extremity same as right Neurologic right lower extremity examination  Reflexes were 2+ and equal at the knee and 1+ and equal at the ankle  Sensation was normal in both feet and legs    Babinski's tests were down going  Straight leg raise testing was  normal bilaterally  The vascular examination revealed normal dorsalis pedis pulses in both feet and both feet were warm with good capillary refill   MEDICAL DECISION SECTION   3 views of the lumbar spine were obtained  Degenerative scoliosis and spondylosis   Encounter Diagnosis  Name Primary?  . Low back pain with sciatica, sciatica laterality unspecified, unspecified back pain laterality, unspecified chronicity Yes   Meds ordered this encounter  Medications  . predniSONE (STERAPRED UNI-PAK 48 TAB) 10 MG (48) TBPK tablet    Sig: Take by mouth daily.    Dispense:  48 tablet    Refill:  0     PLAN:   At this point she is asymptomatic I would place an order for prednisone Dosepak with a couple refills if she gets another exacerbation.  If that does not do the trick then she can call us for reevaluation.    Meds ordered this encounter  Medications  . predniSONE (STERAPRED UNI-PAK 48 TAB) 10 MG (48) TBPK tablet    Sig: Take by mouth daily.    Dispense:  48 tablet    Refill:  0

## 2019-06-05 ENCOUNTER — Other Ambulatory Visit: Payer: Self-pay

## 2019-06-05 ENCOUNTER — Ambulatory Visit (INDEPENDENT_AMBULATORY_CARE_PROVIDER_SITE_OTHER): Payer: PPO

## 2019-06-05 ENCOUNTER — Ambulatory Visit: Payer: PPO | Admitting: Obstetrics & Gynecology

## 2019-06-05 DIAGNOSIS — E538 Deficiency of other specified B group vitamins: Secondary | ICD-10-CM

## 2019-06-27 ENCOUNTER — Other Ambulatory Visit: Payer: Self-pay | Admitting: Family Medicine

## 2019-07-03 ENCOUNTER — Other Ambulatory Visit: Payer: Self-pay | Admitting: Family Medicine

## 2019-07-10 ENCOUNTER — Other Ambulatory Visit: Payer: Self-pay

## 2019-07-10 ENCOUNTER — Ambulatory Visit (INDEPENDENT_AMBULATORY_CARE_PROVIDER_SITE_OTHER): Payer: PPO

## 2019-07-10 DIAGNOSIS — E538 Deficiency of other specified B group vitamins: Secondary | ICD-10-CM | POA: Diagnosis not present

## 2019-07-12 ENCOUNTER — Other Ambulatory Visit (HOSPITAL_COMMUNITY): Payer: Self-pay | Admitting: Family Medicine

## 2019-07-12 DIAGNOSIS — Z1231 Encounter for screening mammogram for malignant neoplasm of breast: Secondary | ICD-10-CM

## 2019-07-20 ENCOUNTER — Other Ambulatory Visit: Payer: Self-pay | Admitting: Internal Medicine

## 2019-07-20 ENCOUNTER — Encounter: Payer: Self-pay | Admitting: Family Medicine

## 2019-07-20 NOTE — Telephone Encounter (Signed)
Jill Roach, can you reach out to patient to see if she needs her protonix refilled. I think she has had a different Protonix prescription sent other than this one that I have received a refill request for.

## 2019-07-21 NOTE — Telephone Encounter (Signed)
Tried calling pt. Mailbox is full. Will call pt back

## 2019-07-24 ENCOUNTER — Telehealth: Payer: Self-pay

## 2019-07-24 ENCOUNTER — Ambulatory Visit (INDEPENDENT_AMBULATORY_CARE_PROVIDER_SITE_OTHER): Payer: PPO | Admitting: Family Medicine

## 2019-07-24 ENCOUNTER — Encounter: Payer: Self-pay | Admitting: Family Medicine

## 2019-07-24 ENCOUNTER — Other Ambulatory Visit: Payer: Self-pay

## 2019-07-24 DIAGNOSIS — Z20828 Contact with and (suspected) exposure to other viral communicable diseases: Secondary | ICD-10-CM | POA: Diagnosis not present

## 2019-07-24 DIAGNOSIS — J019 Acute sinusitis, unspecified: Secondary | ICD-10-CM | POA: Diagnosis not present

## 2019-07-24 DIAGNOSIS — Z20822 Contact with and (suspected) exposure to covid-19: Secondary | ICD-10-CM

## 2019-07-24 MED ORDER — AMOXICILLIN-POT CLAVULANATE 875-125 MG PO TABS: 1.0000 | ORAL_TABLET | Freq: Two times a day (BID) | ORAL | 0 refills | Status: DC

## 2019-07-24 NOTE — Progress Notes (Signed)
Virtual Visit via Telephone Note  I connected with Jill Roach on 07/24/19 at 10:02am by telephone and verified that I am speaking with the correct person using two identifiers.      Pt location: at home   Physician location:  In office, [facility], Vic Blackbird MD     On call: patient and physician   I discussed the limitations, risks, security and privacy concerns of performing an evaluation and management service by telephone and the availability of in person appointments. I also discussed with the patient that there may be a patient responsible charge related to this service. The patient expressed understanding and agreed to proceed.   History of Present Illness:   Pt called in sinus pressure/ pain, swelling t started yesterday. She had some allergy drainage down into throat on and off for the past few weeks. Does not have thermometer, she has not felt feverish.   No body aches, has cough from the drainage , no loss of taste or smell, no GI symptoms  She started flonase yesterday, using singulair and xyzal    She was around a friend that had a positive COVID-19 exposure over the weekend Does have history of sinusitis typically has flare once a year around this time.   Observations/Objective: NAD noted on phonespeaking full sentences, congested nasally  Assessment and Plan: Acute rhinosinusitis with underlying allergies.  However probable  Covid exposure.  She is high risk for decompensation because of her age and her comorbidities.  Recommend she go ahead and get Covid testing.  She is on monitor her symptoms at home.  We will go ahead start her on Augmentin twice daily along with her regular allergy regimen she can also add in Mucinex DM or Robitussin-DM.  Will quarantine at home until results received.  If worsening symptoms she should go to local emergency room  Follow Up Instructions:    I discussed the assessment and treatment plan with the patient. The  patient was provided an opportunity to ask questions and all were answered. The patient agreed with the plan and demonstrated an understanding of the instructions.   The patient was advised to call back or seek an in-person evaluation if the symptoms worsen or if the condition fails to improve as anticipated.  I provided 12  minutes of non-face-to-face time during this encounter. End Time  10:14am   Vic Blackbird, MD

## 2019-07-24 NOTE — Telephone Encounter (Signed)
Pt would like a refill of Pantoprazole 40 mg one capsule daily sent to her pharmacy.

## 2019-07-25 MED ORDER — PANTOPRAZOLE SODIUM 40 MG PO TBEC: 40.0000 mg | DELAYED_RELEASE_TABLET | Freq: Every day | ORAL | 3 refills | Status: DC

## 2019-07-25 NOTE — Telephone Encounter (Signed)
Done

## 2019-07-25 NOTE — Addendum Note (Signed)
Addended by: Annitta Needs on: 07/25/2019 12:19 PM   Modules accepted: Orders

## 2019-07-26 ENCOUNTER — Other Ambulatory Visit: Payer: Self-pay

## 2019-07-26 ENCOUNTER — Ambulatory Visit (HOSPITAL_COMMUNITY): Payer: PPO

## 2019-07-26 ENCOUNTER — Ambulatory Visit: Payer: PPO | Attending: Internal Medicine

## 2019-07-26 DIAGNOSIS — Z20828 Contact with and (suspected) exposure to other viral communicable diseases: Secondary | ICD-10-CM | POA: Diagnosis not present

## 2019-07-26 DIAGNOSIS — Z20822 Contact with and (suspected) exposure to covid-19: Secondary | ICD-10-CM

## 2019-07-27 LAB — NOVEL CORONAVIRUS, NAA: SARS-CoV-2, NAA: DETECTED — AB

## 2019-07-28 ENCOUNTER — Telehealth: Payer: Self-pay | Admitting: Nurse Practitioner

## 2019-07-28 NOTE — Telephone Encounter (Signed)
Returned call to pt.  She stated she was not able to retrieve her voice message this morning, and was trying to get in touch with the nurse that had called her.  Advised pt. That the Nurse Practitioner had reached out to her to discuss Infusion Therapy for COVID.   Offered to try to transfer her to the Nurse Practitioner.   Called the Infusion Clinic at [phone], and was informed that pt. Would need to leave a message on the COVID hot line, for someone to call her back.  Explained to pt. About leaving her name, dob, and brief message on the [employer], and that someone will call her back.  She agreed with plan.  Transferred pt. To [phone].

## 2019-07-28 NOTE — Telephone Encounter (Signed)
Called to Discuss with patient about Covid symptoms and the use of bamlanivimab, a monoclonal antibody infusion for those with mild to moderate Covid symptoms and at a high risk of hospitalization.     Pt is qualified for this infusion at the [facility] center due to co-morbid conditions and/or a member of an at-risk group.     Unable to reach pt  

## 2019-07-28 NOTE — Telephone Encounter (Signed)
Patient is returning call. Patient is aware of her COVID results.

## 2019-07-29 ENCOUNTER — Other Ambulatory Visit: Payer: Self-pay | Admitting: Unknown Physician Specialty

## 2019-07-29 ENCOUNTER — Telehealth: Payer: Self-pay | Admitting: Unknown Physician Specialty

## 2019-07-29 DIAGNOSIS — U071 COVID-19: Secondary | ICD-10-CM

## 2019-07-29 NOTE — Telephone Encounter (Signed)
  I connected by phone with Jill Roach on 07/29/2019 at 10:04 AM to discuss the potential use of an new treatment for mild to moderate COVID-19 viral infection in non-hospitalized patients.  This patient is a 68 y.o. female that meets the FDA criteria for Emergency Use Authorization of bamlanivimab or casirivimab\imdevimab.  Has a (+) direct SARS-CoV-2 viral test result  Has mild or moderate COVID-19   Is ? 69 years of age and weighs ? 40 kg  Is NOT hospitalized due to COVID-19  Is NOT requiring oxygen therapy or requiring an increase in baseline oxygen flow rate due to COVID-19  Is within 10 days of symptom onset  Has at least one of the high risk factor(s) for progression to severe COVID-19 and/or hospitalization as defined in EUA.  Specific high risk criteria : >/= 68 yo   I have spoken and communicated the following to the patient or parent/caregiver:  1. FDA has authorized the emergency use of bamlanivimab and casirivimab\imdevimab for the treatment of mild to moderate COVID-19 in adults and pediatric patients with positive results of direct SARS-CoV-2 viral testing who are 31 years of age and older weighing at least 40 kg, and who are at high risk for progressing to severe COVID-19 and/or hospitalization.  2. The significant known and potential risks and benefits of bamlanivimab and casirivimab\imdevimab, and the extent to which such potential risks and benefits are unknown.  3. Information on available alternative treatments and the risks and benefits of those alternatives, including clinical trials.  4. Patients treated with bamlanivimab and casirivimab\imdevimab should continue to self-isolate and use infection control measures (e.g., wear mask, isolate, social distance, avoid sharing personal items, clean and disinfect "high touch" surfaces, and frequent handwashing) according to CDC guidelines.   5. The patient or parent/caregiver has the option to accept or refuse  bamlanivimab or casirivimab\imdevimab .  After reviewing this information with the patient, The patient agreed to proceed with receiving the bamlanimivab infusion and will be provided a copy of the Fact sheet prior to receiving the infusion.[name] 07/29/2019 10:04 AM

## 2019-07-31 ENCOUNTER — Ambulatory Visit (HOSPITAL_COMMUNITY)
Admission: RE | Admit: 2019-07-31 | Discharge: 2019-07-31 | Disposition: A | Discharge status: Home / Self Care | Payer: Medicare Other | Source: Ambulatory Visit | Attending: Pulmonary Disease | Admitting: Pulmonary Disease

## 2019-07-31 ENCOUNTER — Telehealth: Payer: Self-pay | Admitting: Internal Medicine

## 2019-07-31 DIAGNOSIS — U071 COVID-19: Secondary | ICD-10-CM | POA: Diagnosis not present

## 2019-07-31 DIAGNOSIS — Z23 Encounter for immunization: Secondary | ICD-10-CM | POA: Insufficient documentation

## 2019-07-31 MED ORDER — METHYLPREDNISOLONE SODIUM SUCC 125 MG IJ SOLR: 125.0000 mg | Freq: Once | INTRAMUSCULAR | Status: DC | PRN

## 2019-07-31 MED ORDER — DIPHENHYDRAMINE HCL 50 MG/ML IJ SOLN: 50.0000 mg | Freq: Once | INTRAMUSCULAR | Status: DC | PRN

## 2019-07-31 MED ORDER — SODIUM CHLORIDE 0.9 % IV SOLN: INTRAVENOUS | Status: DC | PRN

## 2019-07-31 MED ORDER — EPINEPHRINE 0.3 MG/0.3ML IJ SOAJ: 0.3000 mg | Freq: Once | INTRAMUSCULAR | Status: DC | PRN

## 2019-07-31 MED ORDER — FAMOTIDINE IN NACL 20-0.9 MG/50ML-% IV SOLN: 20.0000 mg | Freq: Once | INTRAVENOUS | Status: DC | PRN

## 2019-07-31 MED ORDER — SODIUM CHLORIDE 0.9 % IV SOLN
700.0000 mg | Freq: Once | INTRAVENOUS | Status: AC
  Administered 2019-07-31: 700 mg via INTRAVENOUS
  Filled 2019-07-31: qty 20

## 2019-07-31 MED ORDER — ALBUTEROL SULFATE HFA 108 (90 BASE) MCG/ACT IN AERS: 2.0000 | INHALATION_SPRAY | Freq: Once | RESPIRATORY_TRACT | Status: DC | PRN

## 2019-07-31 NOTE — Progress Notes (Signed)
  Diagnosis: COVID-19  Physician: Dr. Buelah Manis  Procedure: Covid Infusion Clinic Med: bamlanivimab infusion - Provided patient with bamlanimivab fact sheet for patients, parents and caregivers prior to infusion.  Complications: No immediate complications noted.  Discharge: Discharged home   Jill Roach 07/31/2019

## 2019-07-31 NOTE — Telephone Encounter (Signed)
I spoke with the patient and she is taking Protonix and it has been filled.  She needs to go to the pharmacy to pick up her Rx.

## 2019-07-31 NOTE — Progress Notes (Signed)
Patient ID: Jill Roach, female   DOB: 1951-04-04, 69 y.o.   MRN: [id number]  Diagnosis: [id number]  Physician: Dr.Wright  Procedure: Covid Infusion Clinic Med: bamlanivimab infusion - Provided patient with bamlanimivab fact sheet for patients, parents and caregivers prior to infusion.  Complications: No immediate complications noted.  Discharge: Discharged home   [place], [place] 07/31/2019

## 2019-07-31 NOTE — Telephone Encounter (Signed)
Pt has some questions about her medication. She asked if someone could call her back later this afternoon after 2 pm. [phone]

## 2019-07-31 NOTE — Discharge Instructions (Signed)
COVID-19 COVID-19 is a respiratory infection that is caused by a virus called severe acute respiratory syndrome coronavirus 2 (SARS-CoV-2). The disease is also known as coronavirus disease or novel coronavirus. In some people, the virus may not cause any symptoms. In others, it may cause a serious infection. The infection can get worse quickly and can lead to complications, such as:  Pneumonia, or infection of the lungs.  Acute respiratory distress syndrome or ARDS. This is fluid build-up in the lungs.  Acute respiratory failure. This is a condition in which there is not enough oxygen passing from the lungs to the body.  Sepsis or septic shock. This is a serious bodily reaction to an infection.  Blood clotting problems.  Secondary infections due to bacteria or fungus. The virus that causes COVID-19 is contagious. This means that it can spread from person to person through droplets from coughs and sneezes (respiratory secretions). What are the causes? This illness is caused by a virus. You may catch the virus by:  Breathing in droplets from an infected person's cough or sneeze.  Touching something, like a table or a doorknob, that was exposed to the virus (contaminated) and then touching your mouth, nose, or eyes. What increases the risk? Risk for infection You are more likely to be infected with this virus if you:  Live in or travel to an area with a COVID-19 outbreak.  Come in contact with a sick person who recently traveled to an area with a COVID-19 outbreak.  Provide care for or live with a person who is infected with COVID-19. Risk for serious illness You are more likely to become seriously ill from the virus if you:  Are 42 years of age or older.  Have a long-term disease that lowers your body's ability to fight infection (immunocompromised).  Live in a nursing home or long-term care facility.  Have a long-term (chronic) disease such as: ? Chronic lung disease,  including chronic obstructive pulmonary disease or asthma ? Heart disease. ? Diabetes. ? Chronic kidney disease. ? Liver disease.  Are obese. What are the signs or symptoms? Symptoms of this condition can range from mild to severe. Symptoms may appear any time from 2 to 14 days after being exposed to the virus. They include:  A fever.  A cough.  Difficulty breathing.  Chills.  Muscle pains.  A sore throat.  Loss of taste or smell. Some people may also have stomach problems, such as nausea, vomiting, or diarrhea. Other people may not have any symptoms of COVID-19. How is this diagnosed? This condition may be diagnosed based on:  Your signs and symptoms, especially if: ? You live in an area with a COVID-19 outbreak. ? You recently traveled to or from an area where the virus is common. ? You provide care for or live with a person who was diagnosed with COVID-19.  A physical exam.  Lab tests, which may include: ? A nasal swab to take a sample of fluid from your nose. ? A throat swab to take a sample of fluid from your throat. ? A sample of mucus from your lungs (sputum). ? Blood tests.  Imaging tests, which may include, X-rays, CT scan, or ultrasound. How is this treated? At present, there is no medicine to treat COVID-19. Medicines that treat other diseases are being used on a trial basis to see if they are effective against COVID-19. Your health care provider will talk with you about ways to treat your symptoms. For  most people, the infection is mild and can be managed at home with rest, fluids, and over-the-counter medicines. Treatment for a serious infection usually takes places in a hospital intensive care unit (ICU). It may include one or more of the following treatments. These treatments are given until your symptoms improve.  Receiving fluids and medicines through an IV.  Supplemental oxygen. Extra oxygen is given through a tube in the nose, a face mask, or a  hood.  Positioning you to lie on your stomach (prone position). This makes it easier for oxygen to get into the lungs.  Continuous positive airway pressure (CPAP) or bi-level positive airway pressure (BPAP) machine. This treatment uses mild air pressure to keep the airways open. A tube that is connected to a motor delivers oxygen to the body.  Ventilator. This treatment moves air into and out of the lungs by using a tube that is placed in your windpipe.  Tracheostomy. This is a procedure to create a hole in the neck so that a breathing tube can be inserted.  Extracorporeal membrane oxygenation (ECMO). This procedure gives the lungs a chance to recover by taking over the functions of the heart and lungs. It supplies oxygen to the body and removes carbon dioxide. Follow these instructions at home: Lifestyle  If you are sick, stay home except to get medical care. Your health care provider will tell you how long to stay home. Call your health care provider before you go for medical care.  Rest at home as told by your health care provider.  Do not use any products that contain nicotine or tobacco, such as cigarettes, e-cigarettes, and chewing tobacco. If you need help quitting, ask your health care provider.  Return to your normal activities as told by your health care provider. Ask your health care provider what activities are safe for you. General instructions  Take over-the-counter and prescription medicines only as told by your health care provider.  Drink enough fluid to keep your urine pale yellow.  Keep all follow-up visits as told by your health care provider. This is important. How is this prevented?  There is no vaccine to help prevent COVID-19 infection. However, there are steps you can take to protect yourself and others from this virus. To protect yourself:   Do not travel to areas where COVID-19 is a risk. The areas where COVID-19 is reported change often. To identify  high-risk areas and travel restrictions, check the CDC travel website: wwwnc.cdc.gov/travel/notices  If you live in, or must travel to, an area where COVID-19 is a risk, take precautions to avoid infection. ? Stay away from people who are sick. ? Wash your hands often with soap and water for 20 seconds. If soap and water are not available, use an alcohol-based hand sanitizer. ? Avoid touching your mouth, face, eyes, or nose. ? Avoid going out in public, follow guidance from your state and local health authorities. ? If you must go out in public, wear a cloth face covering or face mask. ? Disinfect objects and surfaces that are frequently touched every day. This may include:  Counters and tables.  Doorknobs and light switches.  Sinks and faucets.  Electronics, such as phones, remote controls, keyboards, computers, and tablets. To protect others: If you have symptoms of COVID-19, take steps to prevent the virus from spreading to others.  If you think you have a COVID-19 infection, contact your health care provider right away. Tell your health care team that you think you   may have a COVID-19 infection.  Stay home. Leave your house only to seek medical care. Do not use public transport.  Do not travel while you are sick.  Wash your hands often with soap and water for 20 seconds. If soap and water are not available, use alcohol-based hand sanitizer.  Stay away from other members of your household. Let healthy household members care for children and pets, if possible. If you have to care for children or pets, wash your hands often and wear a mask. If possible, stay in your own room, separate from others. Use a different bathroom.  Make sure that all people in your household wash their hands well and often.  Cough or sneeze into a tissue or your sleeve or elbow. Do not cough or sneeze into your hand or into the air.  Wear a cloth face covering or face mask. Where to find more  information  Centers for Disease Control and Prevention: PurpleGadgets.be  World Health Organization: https://www.castaneda.info/ Contact a health care provider if:  You live in or have traveled to an area where COVID-19 is a risk and you have symptoms of the infection.  You have had contact with someone who has COVID-19 and you have symptoms of the infection. Get help right away if:  You have trouble breathing.  You have pain or pressure in your chest.  You have confusion.  You have bluish lips and fingernails.  You have difficulty waking from sleep.  You have symptoms that get worse. These symptoms may represent a serious problem that is an emergency. Do not wait to see if the symptoms will go away. Get medical help right away. Call your local emergency services (911 in the [place] Do not drive yourself to the hospital. Let the emergency medical personnel know if you think you have COVID-19. Summary  COVID-19 is a respiratory infection that is caused by a virus. It is also known as coronavirus disease or novel coronavirus. It can cause serious infections, such as pneumonia, acute respiratory distress syndrome, acute respiratory failure, or sepsis.  The virus that causes COVID-19 is contagious. This means that it can spread from person to person through droplets from coughs and sneezes.  You are more likely to develop a serious illness if you are 83 years of age or older, have a weak immunity, live in a nursing home, or have chronic disease.  There is no medicine to treat COVID-19. Your health care provider will talk with you about ways to treat your symptoms.  Take steps to protect yourself and others from infection. Wash your hands often and disinfect objects and surfaces that are frequently touched every day. Stay away from people who are sick and wear a mask if you are sick. This information is not intended to replace advice given to you by  your health care provider. Make sure you discuss any questions you have with your health care provider. Document Released: 09/01/2018 Document Revised: 12/22/2018 Document Reviewed: 09/01/2018 Elsevier Patient Education  [place] if You Are Sick If you are sick with COVID-19 or think you might have COVID-19, follow the steps below to help protect other people in your home and community. Stay home except to get medical care.  Stay home. Most people with COVID-19 have mild illness and are able to recover at home without medical care. Do not leave your home, except to get medical care. Do not visit public areas.  Take care of  yourself. Get rest and stay hydrated.  Get medical care when needed. Call your doctor before you go to their office for care. But, if you have trouble breathing or other concerning symptoms, call 911 for immediate help.  Avoid public transportation, ride-sharing, or taxis. Separate yourself from other people and pets in your home.  As much as possible, stay in a specific room and away from other people and pets in your home. Also, you should use a separate bathroom, if available. If you need to be around other people or animals in or outside of the home, wear a cloth face covering. ? See COVID-19 and Animals if you have questions about pets: https://www.thomas.biz/ Monitor your symptoms.  Common symptoms of COVID-19 include fever and cough. Trouble breathing is a more serious symptom that means you should get medical attention.  Follow care instructions from your healthcare provider and local health department. Your local health authorities will give instructions on checking your symptoms and reporting information. If you develop emergency warning signs for COVID-19 get medical attention immediately.  Emergency warning signs include*:  Trouble breathing  Persistent pain or pressure in  the chest  New confusion or not able to be woken  Bluish lips or face *This list is not all inclusive. Please consult your medical provider for any other symptoms that are severe or concerning to you. Call 911 if you have a medical emergency. If you have a medical emergency and need to call 911, notify the operator that you have or think you might have, COVID-19. If possible, put on a facemask before medical help arrives. Call ahead before visiting your doctor.  Call ahead. Many medical visits for routine care are being postponed or done by phone or telemedicine.  If you have a medical appointment that cannot be postponed, call your doctor's office. This will help the office protect themselves and other patients. If you are sick, wear a cloth covering over your nose and mouth.  You should wear a cloth face covering over your nose and mouth if you must be around other people or animals, including pets (even at home).  You don't need to wear the cloth face covering if you are alone. If you can't put on a cloth face covering (because of trouble breathing for example), cover your coughs and sneezes in some other way. Try to stay at least 6 feet away from other people. This will help protect the people around you. Note: During the COVID-19 pandemic, medical grade facemasks are reserved for healthcare workers and some first responders. You may need to make a cloth face covering using a scarf or bandana. Cover your coughs and sneezes.  Cover your mouth and nose with a tissue when you cough or sneeze.  Throw used tissues in a lined trash can.  Immediately wash your hands with soap and water for at least 20 seconds. If soap and water are not available, clean your hands with an alcohol-based hand sanitizer that contains at least 60% alcohol. Clean your hands often.  Wash your hands often with soap and water for at least 20 seconds. This is especially important after blowing your nose, coughing, or  sneezing; going to the bathroom; and before eating or preparing food.  Use hand sanitizer if soap and water are not available. Use an alcohol-based hand sanitizer with at least 60% alcohol, covering all surfaces of your hands and rubbing them together until they feel dry.  Soap and water are the best option, especially if  your hands are visibly dirty.  Avoid touching your eyes, nose, and mouth with unwashed hands. Avoid sharing personal household items.  Do not share dishes, drinking glasses, cups, eating utensils, towels, or bedding with other people in your home.  Wash these items thoroughly after using them with soap and water or put them in the dishwasher. Clean all "high-touch" surfaces everyday.  Clean and disinfect high-touch surfaces in your "sick room" and bathroom. Let someone else clean and disinfect surfaces in common areas, but not your bedroom and bathroom.  If a caregiver or other person needs to clean and disinfect a sick person's bedroom or bathroom, they should do so on an as-needed basis. The caregiver/other person should wear a mask and wait as long as possible after the sick person has used the bathroom. High-touch surfaces include phones, remote controls, counters, tabletops, doorknobs, bathroom fixtures, toilets, keyboards, tablets, and bedside tables.  Clean and disinfect areas that may have blood, stool, or body fluids on them.  Use household cleaners and disinfectants. Clean the area or item with soap and water or another detergent if it is dirty. Then use a household disinfectant. ? Be sure to follow the instructions on the label to ensure safe and effective use of the product. Many products recommend keeping the surface wet for several minutes to ensure germs are killed. Many also recommend precautions such as wearing gloves and making sure you have good ventilation during use of the product. ? Most EPA-registered household disinfectants should be effective. How to  discontinue home isolation  People with COVID-19 who have stayed home (home isolated) can stop home isolation under the following conditions: ? If you will not have a test to determine if you are still contagious, you can leave home after these three things have happened:  You have had no fever for at least 72 hours (that is three full days of no fever without the use of medicine that reduces fevers) AND  other symptoms have improved (for example, when your cough or shortness of breath has improved) AND  at least 10 days have passed since your symptoms first appeared. ? If you will be tested to determine if you are still contagious, you can leave home after these three things have happened:  You no longer have a fever (without the use of medicine that reduces fevers) AND  other symptoms have improved (for example, when your cough or shortness of breath has improved) AND  you received two negative tests in a row, 24 hours apart. Your doctor will follow CDC guidelines. In all cases, follow the guidance of your healthcare provider and local health department. The decision to stop home isolation should be made in consultation with your healthcare provider and state and local health departments. Local decisions depend on local circumstances. [email or web address] 12/11/2018 This information is not intended to replace advice given to you by your health care provider. Make sure you discuss any questions you have with your health care provider. Document Released: 11/22/2018 Document Revised: 12/21/2018 Document Reviewed: 11/22/2018 Elsevier Patient Education  2020 [employer]. COVID-19 COVID-19 is a respiratory infection that is caused by a virus called severe acute respiratory syndrome coronavirus 2 (SARS-CoV-2). The disease is also known as coronavirus disease or novel coronavirus. In some people, the virus may not cause any symptoms. In others, it may cause a serious infection. The infection can  get worse quickly and can lead to complications, such as:  Pneumonia, or infection of the lungs.  Acute respiratory  distress syndrome or ARDS. This is fluid build-up in the lungs.  Acute respiratory failure. This is a condition in which there is not enough oxygen passing from the lungs to the body.  Sepsis or septic shock. This is a serious bodily reaction to an infection.  Blood clotting problems.  Secondary infections due to bacteria or fungus. The virus that causes COVID-19 is contagious. This means that it can spread from person to person through droplets from coughs and sneezes (respiratory secretions). What are the causes? This illness is caused by a virus. You may catch the virus by:  Breathing in droplets from an infected person's cough or sneeze.  Touching something, like a table or a doorknob, that was exposed to the virus (contaminated) and then touching your mouth, nose, or eyes. What increases the risk? Risk for infection You are more likely to be infected with this virus if you:  Live in or travel to an area with a COVID-19 outbreak.  Come in contact with a sick person who recently traveled to an area with a COVID-19 outbreak.  Provide care for or live with a person who is infected with COVID-19. Risk for serious illness You are more likely to become seriously ill from the virus if you:  Are 32 years of age or older.  Have a long-term disease that lowers your body's ability to fight infection (immunocompromised).  Live in a nursing home or long-term care facility.  Have a long-term (chronic) disease such as: ? Chronic lung disease, including chronic obstructive pulmonary disease or asthma ? Heart disease. ? Diabetes. ? Chronic kidney disease. ? Liver disease.  Are obese. What are the signs or symptoms? Symptoms of this condition can range from mild to severe. Symptoms may appear any time from 2 to 14 days after being exposed to the virus. They  include:  A fever.  A cough.  Difficulty breathing.  Chills.  Muscle pains.  A sore throat.  Loss of taste or smell. Some people may also have stomach problems, such as nausea, vomiting, or diarrhea. Other people may not have any symptoms of COVID-19. How is this diagnosed? This condition may be diagnosed based on:  Your signs and symptoms, especially if: ? You live in an area with a COVID-19 outbreak. ? You recently traveled to or from an area where the virus is common. ? You provide care for or live with a person who was diagnosed with COVID-19.  A physical exam.  Lab tests, which may include: ? A nasal swab to take a sample of fluid from your nose. ? A throat swab to take a sample of fluid from your throat. ? A sample of mucus from your lungs (sputum). ? Blood tests.  Imaging tests, which may include, X-rays, CT scan, or ultrasound. How is this treated? At present, there is no medicine to treat COVID-19. Medicines that treat other diseases are being used on a trial basis to see if they are effective against COVID-19. Your health care provider will talk with you about ways to treat your symptoms. For most people, the infection is mild and can be managed at home with rest, fluids, and over-the-counter medicines. Treatment for a serious infection usually takes places in a hospital intensive care unit (ICU). It may include one or more of the following treatments. These treatments are given until your symptoms improve.  Receiving fluids and medicines through an IV.  Supplemental oxygen. Extra oxygen is given through a tube in the nose, a  face mask, or a hood.  Positioning you to lie on your stomach (prone position). This makes it easier for oxygen to get into the lungs.  Continuous positive airway pressure (CPAP) or bi-level positive airway pressure (BPAP) machine. This treatment uses mild air pressure to keep the airways open. A tube that is connected to a motor delivers  oxygen to the body.  Ventilator. This treatment moves air into and out of the lungs by using a tube that is placed in your windpipe.  Tracheostomy. This is a procedure to create a hole in the neck so that a breathing tube can be inserted.  Extracorporeal membrane oxygenation (ECMO). This procedure gives the lungs a chance to recover by taking over the functions of the heart and lungs. It supplies oxygen to the body and removes carbon dioxide. Follow these instructions at home: Lifestyle  If you are sick, stay home except to get medical care. Your health care provider will tell you how long to stay home. Call your health care provider before you go for medical care.  Rest at home as told by your health care provider.  Do not use any products that contain nicotine or tobacco, such as cigarettes, e-cigarettes, and chewing tobacco. If you need help quitting, ask your health care provider.  Return to your normal activities as told by your health care provider. Ask your health care provider what activities are safe for you. General instructions  Take over-the-counter and prescription medicines only as told by your health care provider.  Drink enough fluid to keep your urine pale yellow.  Keep all follow-up visits as told by your health care provider. This is important. How is this prevented?  There is no vaccine to help prevent COVID-19 infection. However, there are steps you can take to protect yourself and others from this virus. To protect yourself:   Do not travel to areas where COVID-19 is a risk. The areas where COVID-19 is reported change often. To identify high-risk areas and travel restrictions, check the CDC travel website: FatFares.com.br  If you live in, or must travel to, an area where COVID-19 is a risk, take precautions to avoid infection. ? Stay away from people who are sick. ? Wash your hands often with soap and water for 20 seconds. If soap and water are not  available, use an alcohol-based hand sanitizer. ? Avoid touching your mouth, face, eyes, or nose. ? Avoid going out in public, follow guidance from your state and local health authorities. ? If you must go out in public, wear a cloth face covering or face mask. ? Disinfect objects and surfaces that are frequently touched every day. This may include:  Counters and tables.  Doorknobs and light switches.  Sinks and faucets.  Electronics, such as phones, remote controls, keyboards, computers, and tablets. To protect others: If you have symptoms of COVID-19, take steps to prevent the virus from spreading to others.  If you think you have a COVID-19 infection, contact your health care provider right away. Tell your health care team that you think you may have a COVID-19 infection.  Stay home. Leave your house only to seek medical care. Do not use public transport.  Do not travel while you are sick.  Wash your hands often with soap and water for 20 seconds. If soap and water are not available, use alcohol-based hand sanitizer.  Stay away from other members of your household. Let healthy household members care for children and pets, if possible. If  you have to care for children or pets, wash your hands often and wear a mask. If possible, stay in your own room, separate from others. Use a different bathroom.  Make sure that all people in your household wash their hands well and often.  Cough or sneeze into a tissue or your sleeve or elbow. Do not cough or sneeze into your hand or into the air.  Wear a cloth face covering or face mask. Where to find more information  Centers for Disease Control and Prevention: PurpleGadgets.be  World Health Organization: https://www.castaneda.info/ Contact a health care provider if:  You live in or have traveled to an area where COVID-19 is a risk and you have symptoms of the infection.  You have had contact with  someone who has COVID-19 and you have symptoms of the infection. Get help right away if:  You have trouble breathing.  You have pain or pressure in your chest.  You have confusion.  You have bluish lips and fingernails.  You have difficulty waking from sleep.  You have symptoms that get worse. These symptoms may represent a serious problem that is an emergency. Do not wait to see if the symptoms will go away. Get medical help right away. Call your local emergency services (911 in the [place] Do not drive yourself to the hospital. Let the emergency medical personnel know if you think you have COVID-19. Summary  COVID-19 is a respiratory infection that is caused by a virus. It is also known as coronavirus disease or novel coronavirus. It can cause serious infections, such as pneumonia, acute respiratory distress syndrome, acute respiratory failure, or sepsis.  The virus that causes COVID-19 is contagious. This means that it can spread from person to person through droplets from coughs and sneezes.  You are more likely to develop a serious illness if you are 6 years of age or older, have a weak immunity, live in a nursing home, or have chronic disease.  There is no medicine to treat COVID-19. Your health care provider will talk with you about ways to treat your symptoms.  Take steps to protect yourself and others from infection. Wash your hands often and disinfect objects and surfaces that are frequently touched every day. Stay away from people who are sick and wear a mask if you are sick. This information is not intended to replace advice given to you by your health care provider. Make sure you discuss any questions you have with your health care provider. Document Released: 09/01/2018 Document Revised: 12/22/2018 Document Reviewed: 09/01/2018 Elsevier Patient Education  2020 [employer].   COVID-19 Frequently Asked Questions COVID-19 (coronavirus disease) is an infection that is  caused by a large family of viruses. Some viruses cause illness in people and others cause illness in animals like camels, cats, and bats. In some cases, the viruses that cause illness in animals can spread to humans. Where did the coronavirus come from? In December 2019, [place] told the [facility]) of several cases of lung disease (human respiratory illness). These cases were linked to an open seafood and livestock market in the city of [place]. The link to the seafood and livestock market suggests that the virus may have spread from animals to humans. However, since that first outbreak in December, the virus has also been shown to spread from person to person. What is the name of the disease and the virus? Disease name Early on, this disease was called novel coronavirus. This is because scientists  determined that the disease was caused by a new (novel) respiratory virus. The World Health Organization [facility]) has now named the disease COVID-19, or coronavirus disease. Virus name The virus that causes the disease is called severe acute respiratory syndrome coronavirus 2 (SARS-CoV-2). More information on disease and virus naming World Health Organization [facility]): www.who.int/emergencies/diseases/novel-coronavirus-2019/technical-guidance/naming-the-coronavirus-disease-(covid-2019)-and-the-virus-that-causes-it Who is at risk for complications from coronavirus disease? Some people may be at higher risk for complications from coronavirus disease. This includes older adults and people who have chronic diseases, such as heart disease, diabetes, and lung disease. If you are at higher risk for complications, take these extra precautions:  Avoid close contact with people who are sick or have a fever or cough. Stay at least 3-6 ft (1-2 m) away from them, if possible.  Wash your hands often with soap and water for at least 20 seconds.  Avoid touching your face, mouth, nose, or eyes.  Keep  supplies on hand at home, such as food, medicine, and cleaning supplies.  Stay home as much as possible.  Avoid social gatherings and travel. How does coronavirus disease spread? The virus that causes coronavirus disease spreads easily from person to person (is contagious). There are also cases of community-spread disease. This means the disease has spread to:  People who have no known contact with other infected people.  People who have not traveled to areas where there are known cases. It appears to spread from one person to another through droplets from coughing or sneezing. Can I get the virus from touching surfaces or objects? There is still a lot that we do not know about the virus that causes coronavirus disease. Scientists are basing a lot of information on what they know about similar viruses, such as:  Viruses cannot generally survive on surfaces for long. They need a human body (host) to survive.  It is more likely that the virus is spread by close contact with people who are sick (direct contact), such as through: ? Shaking hands or hugging. ? Breathing in respiratory droplets that travel through the air. This can happen when an infected person coughs or sneezes on or near other people.  It is less likely that the virus is spread when a person touches a surface or object that has the virus on it (indirect contact). The virus may be able to enter the body if the person touches a surface or object and then touches his or her face, eyes, nose, or mouth. Can a person spread the virus without having symptoms of the disease? It may be possible for the virus to spread before a person has symptoms of the disease, but this is most likely not the main way the virus is spreading. It is more likely for the virus to spread by being in close contact with people who are sick and breathing in the respiratory droplets of a sick person's cough or sneeze. What are the symptoms of coronavirus  disease? Symptoms vary from person to person and can range from mild to severe. Symptoms may include:  Fever.  Cough.  Tiredness, weakness, or fatigue.  Fast breathing or feeling short of breath. These symptoms can appear anywhere from 2 to 14 days after you have been exposed to the virus. If you develop symptoms, call your health care provider. People with severe symptoms may need hospital care. If I am exposed to the virus, how long does it take before symptoms start? Symptoms of coronavirus disease may appear anywhere from 2 to 14 days  after a person has been exposed to the virus. If you develop symptoms, call your health care provider. Should I be tested for this virus? Your health care provider will decide whether to test you based on your symptoms, history of exposure, and your risk factors. How does a health care provider test for this virus? Health care providers will collect samples to send for testing. Samples may include:  Taking a swab of fluid from the nose.  Taking fluid from the lungs by having you cough up mucus (sputum) into a sterile cup.  Taking a blood sample.  Taking a stool or urine sample. Is there a treatment or vaccine for this virus? Currently, there is no vaccine to prevent coronavirus disease. Also, there are no medicines like antibiotics or antivirals to treat the virus. A person who becomes sick is given supportive care, which means rest and fluids. A person may also relieve his or her symptoms by using over-the-counter medicines that treat sneezing, coughing, and runny nose. These are the same medicines that a person takes for the common cold. If you develop symptoms, call your health care provider. People with severe symptoms may need hospital care. What can I do to protect myself and my family from this virus?     You can protect yourself and your family by taking the same actions that you would take to prevent the spread of other viruses. Take the  following actions:  Wash your hands often with soap and water for at least 20 seconds. If soap and water are not available, use alcohol-based hand sanitizer.  Avoid touching your face, mouth, nose, or eyes.  Cough or sneeze into a tissue, sleeve, or elbow. Do not cough or sneeze into your hand or the air. ? If you cough or sneeze into a tissue, throw it away immediately and wash your hands.  Disinfect objects and surfaces that you frequently touch every day.  Avoid close contact with people who are sick or have a fever or cough. Stay at least 3-6 ft (1-2 m) away from them, if possible.  Stay home if you are sick, except to get medical care. Call your health care provider before you get medical care.  Make sure your vaccines are up to date. Ask your health care provider what vaccines you need. What should I do if I need to travel? Follow travel recommendations from your local health authority, the CDC, and WHO. Travel information and advice  Centers for Disease Control and Prevention (CDC): BodyEditor.hu  World Health Organization [facility]): ThirdIncome.ca Know the risks and take action to protect your health  You are at higher risk of getting coronavirus disease if you are traveling to areas with an outbreak or if you are exposed to travelers from areas with an outbreak.  Wash your hands often and practice good hygiene to lower the risk of catching or spreading the virus. What should I do if I am sick? General instructions to stop the spread of infection  Wash your hands often with soap and water for at least 20 seconds. If soap and water are not available, use alcohol-based hand sanitizer.  Cough or sneeze into a tissue, sleeve, or elbow. Do not cough or sneeze into your hand or the air.  If you cough or sneeze into a tissue, throw it away immediately and wash your hands.  Stay home  unless you must get medical care. Call your health care provider or local health authority before you get medical care.  Avoid public areas. Do not take public transportation, if possible.  If you can, wear a mask if you must go out of the house or if you are in close contact with someone who is not sick. Keep your home clean  Disinfect objects and surfaces that are frequently touched every day. This may include: ? Counters and tables. ? Doorknobs and light switches. ? Sinks and faucets. ? Electronics such as phones, remote controls, keyboards, computers, and tablets.  Wash dishes in hot, soapy water or use a dishwasher. Air-dry your dishes.  Wash laundry in hot water. Prevent infecting other household members  Let healthy household members care for children and pets, if possible. If you have to care for children or pets, wash your hands often and wear a mask.  Sleep in a different bedroom or bed, if possible.  Do not share personal items, such as razors, toothbrushes, deodorant, combs, brushes, towels, and washcloths. Where to find more information Centers for Disease Control and Prevention (CDC)  Information and news updates: https://www.butler-gonzalez.com/ World Health Organization [facility])  Information and news updates: MissExecutive.com.ee  Coronavirus health topic: https://www.castaneda.info/  Questions and answers on COVID-19: OpportunityDebt.at  Global tracker: who.sprinklr.com American Academy of Pediatrics (AAP)  Information for families: www.healthychildren.org/English/health-issues/conditions/chest-lungs/Pages/2019-Novel-Coronavirus.aspx The coronavirus situation is changing rapidly. Check your local health authority website or the CDC and [facility] websites for updates and news. When should I contact a health care provider?  Contact your health care provider if you have symptoms of an  infection, such as fever or cough, and you: ? Have been near anyone who is known to have coronavirus disease. ? Have come into contact with a person who is suspected to have coronavirus disease. ? Have traveled outside of the country. When should I get emergency medical care?  Get help right away by calling your local emergency services (911 in the [place] if you have: ? Trouble breathing. ? Pain or pressure in your chest. ? Confusion. ? Blue-tinged lips and fingernails. ? Difficulty waking from sleep. ? Symptoms that get worse. Let the emergency medical personnel know if you think you have coronavirus disease. Summary  A new respiratory virus is spreading from person to person and causing COVID-19 (coronavirus disease).  The virus that causes COVID-19 appears to spread easily. It spreads from one person to another through droplets from coughing or sneezing.  Older adults and those with chronic diseases are at higher risk of disease. If you are at higher risk for complications, take extra precautions.  There is currently no vaccine to prevent coronavirus disease. There are no medicines, such as antibiotics or antivirals, to treat the virus.  You can protect yourself and your family by washing your hands often, avoiding touching your face, and covering your coughs and sneezes. This information is not intended to replace advice given to you by your health care provider. Make sure you discuss any questions you have with your health care provider. Document Released: 11/22/2018 Document Revised: 11/22/2018 Document Reviewed: 11/22/2018 Elsevier Patient Education  [place].

## 2019-07-31 NOTE — Telephone Encounter (Signed)
Noted  

## 2019-08-01 ENCOUNTER — Other Ambulatory Visit: Payer: Self-pay | Admitting: Family Medicine

## 2019-08-01 DIAGNOSIS — J309 Allergic rhinitis, unspecified: Secondary | ICD-10-CM

## 2019-08-07 NOTE — Telephone Encounter (Signed)
Noted. Spoke with pt. Pt notified of Kh's recommendations.

## 2019-08-07 NOTE — Telephone Encounter (Signed)
It is possible that long term use of Pantoprazole (and PPIs in general) could contribute to vitamin B12 deficiency. Data regarding the clinical significance is not entirely clear. There in increased risk with long term high dose PPIs. She is currently only taking Pantoprazole once a day. Additionally, she has history of GERD and esophagitis and needs Pantoprazole to help control symptoms and protect her esophagus. Also history of globus sensation that has responded well to PPI therapy. In this case, it is hard to say if her PPI is contributing to her vitamin B12 deficiency, but from a GI standpoint, I feel the benefits of PPI therapy are greater.

## 2019-08-07 NOTE — Telephone Encounter (Signed)
Called pt, VM isn't available. Not able to leave a message. Will call pt back.

## 2019-08-07 NOTE — Telephone Encounter (Signed)
Pt called back. Pt has a question about the Pantoprazole 40 mg that she takes daily. Pt read that it can cause a low B12 level. Pt stated that she has to get injections weekly for a low B12 level and wonders if it could be from the Pantoprazole. Please advise.

## 2019-08-09 ENCOUNTER — Ambulatory Visit: Payer: PPO | Admitting: Family Medicine

## 2019-08-14 ENCOUNTER — Ambulatory Visit: Payer: PPO | Admitting: Gastroenterology

## 2019-08-15 ENCOUNTER — Ambulatory Visit (INDEPENDENT_AMBULATORY_CARE_PROVIDER_SITE_OTHER): Payer: PPO | Admitting: Family Medicine

## 2019-08-15 ENCOUNTER — Other Ambulatory Visit: Payer: Self-pay

## 2019-08-15 ENCOUNTER — Encounter: Payer: Self-pay | Admitting: Family Medicine

## 2019-08-15 DIAGNOSIS — Z8616 Personal history of COVID-19: Secondary | ICD-10-CM | POA: Diagnosis not present

## 2019-08-15 DIAGNOSIS — J00 Acute nasopharyngitis [common cold]: Secondary | ICD-10-CM

## 2019-08-15 MED ORDER — DOXYCYCLINE HYCLATE 100 MG PO TABS: 100.0000 mg | ORAL_TABLET | Freq: Two times a day (BID) | ORAL | 0 refills | Status: DC

## 2019-08-15 MED ORDER — PREDNISONE 20 MG PO TABS: ORAL_TABLET | ORAL | 0 refills | Status: DC

## 2019-08-15 NOTE — Progress Notes (Signed)
Virtual Visit via Telephone Note  I connected with Jill Roach on 08/15/19 at 8:39am by telephone and verified that I am speaking with the correct person using two identifiers.      Pt location: at home   Physician location:  In office, [facility], Vic Blackbird MD     On call: patient and physician   I discussed the limitations, risks, security and privacy concerns of performing an evaluation and management service by telephone and the availability of in person appointments. I also discussed with the patient that there may be a patient responsible charge related to this service. The patient expressed understanding and agreed to proceed.   History of Present Illness:  Pt called in with ongoing sinus pressure and drainage and mild headache. She was positive for COVID-19 about 2 weeks ago. She had body aches, sinus pressure, headache. She did have monoclonal antibody given on 12/21. She did complete the augmentin on 12/24.  I have given her at her visit on 12/14 and her symptoms improved some but then worsened over the past few days. She has used netty pot one night had severe pain the 2nd night she tried it.  She is using singulair and xyzal  She did lose taste and smell initially but that has improved  She has not any fever for 2 weeks    Observations/Objective: NAD noted on phone, speaking in full sentences   Assessment and Plan: Acute rhinosinusitis-secondary bacterial infection status post COVID-19.  We will place her on doxycycline along with prednisone taper.  She does have underlying severe allergies and rhinitis.Marland Kitchen He will continue her regular allergy medications.  Follow Up Instructions:    I discussed the assessment and treatment plan with the patient. The patient was provided an opportunity to ask questions and all were answered. The patient agreed with the plan and demonstrated an understanding of the instructions.   The patient was advised to call  back or seek an in-person evaluation if the symptoms worsen or if the condition fails to improve as anticipated.  I provided 10 minutes of non-face-to-face time during this encounter. End Time 8:49am   Vic Blackbird, MD

## 2019-08-18 ENCOUNTER — Encounter: Payer: PPO | Admitting: Family Medicine

## 2019-08-18 ENCOUNTER — Ambulatory Visit: Payer: PPO | Admitting: Family Medicine

## 2019-08-25 ENCOUNTER — Ambulatory Visit (INDEPENDENT_AMBULATORY_CARE_PROVIDER_SITE_OTHER): Payer: PPO | Admitting: Family Medicine

## 2019-08-25 ENCOUNTER — Other Ambulatory Visit: Payer: Self-pay

## 2019-08-25 VITALS — BP 110/68 | HR 75 | Temp 98.5°F | Resp 18 | Ht 64.0 in | Wt 177.6 lb

## 2019-08-25 DIAGNOSIS — K2100 Gastro-esophageal reflux disease with esophagitis, without bleeding: Secondary | ICD-10-CM | POA: Diagnosis not present

## 2019-08-25 DIAGNOSIS — E538 Deficiency of other specified B group vitamins: Secondary | ICD-10-CM

## 2019-08-25 DIAGNOSIS — E038 Other specified hypothyroidism: Secondary | ICD-10-CM | POA: Diagnosis not present

## 2019-08-25 DIAGNOSIS — I10 Essential (primary) hypertension: Secondary | ICD-10-CM | POA: Diagnosis not present

## 2019-08-25 NOTE — Progress Notes (Signed)
   Subjective:    Patient ID: Jill Roach, female    DOB: Oct 23, 1950, 69 y.o.   MRN: [id number]  Patient presents for b12 concerns    She is currently on B12 injections once a month , due today for injection   April 2020- low at 147   She is on PPI for esophagitis- on protonix, has appt in February , she was concerned about memory loss and being on this med   HTN- bp has been controlled    She had COVID-19 -    Appetite has improved , sinus infection is also improved,still has some fatigue    Hypothyroidism- taking synthroid     Chronic insomnia- still taking trazodone for sleep         Review Of Systems:  GEN- denies fatigue, fever, weight loss,weakness, recent illness HEENT- denies eye drainage, change in vision, nasal discharge, CVS- denies chest pain, palpitations RESP- denies SOB, cough, wheeze ABD- denies N/V, change in stools, abd pain GU- denies dysuria, hematuria, dribbling, incontinence MSK- denies joint pain, muscle aches, injury Neuro- denies headache, dizziness, syncope, seizure activity       Objective:    BP 110/68 (BP Location: Right Arm, Patient Position: Sitting, Cuff Size: Normal)   Pulse 75   Temp 98.5 F (36.9 C) (Oral)   Resp 18   Ht 5\' 4"  (1.626 m)   Wt 177 lb 9.6 oz (80.6 kg)   SpO2 97%   BMI 30.48 kg/m  GEN- NAD, alert and oriented x3 HEENT- PERRL, EOMI, non injected sclera, Neck- Supple, no thyromegaly CVS- RRR, no murmur RESP-CTAB EXT- No edema Pulses- Radial 2+        Assessment & Plan:      Problem List Items Addressed This Visit      Unprioritized   Hypertension - Primary    Controlled no changes  fasting labs today Recheck TFT for thyroid replacement, continue oral meds      Relevant Orders   TSH (Completed)   CBC with Differential (Completed)   Comprehensive metabolic panel (Completed)   Hypothyroidism   Relevant Orders   TSH (Completed)   Reflux esophagitis    Recommend she stay on PPI, benefits  outweight risk in her case due to symptoms Note she had already discussed this with GI and they told her the same thing        Other Visit Diagnoses    B12 deficiency       injection given today, recheck her levels, may be able to transition to oral    Relevant Orders   Vitamin B12 (Completed)      Note: This dictation was prepared with Dragon dictation along with smaller phrase technology. Any transcriptional errors that result from this process are unintentional.

## 2019-08-25 NOTE — Patient Instructions (Addendum)
Calcium 1200mg  Vitamin D 1000IU  Vitamin C 1000mg   Zinc at least 50mg   F/U 4 months for Physical

## 2019-08-26 LAB — CBC WITH DIFFERENTIAL/PLATELET
Absolute Monocytes: 933 cells/uL (ref 200–950)
Basophils Absolute: 62 cells/uL (ref 0–200)
Basophils Relative: 0.7 %
Eosinophils Absolute: 106 cells/uL (ref 15–500)
Eosinophils Relative: 1.2 %
HCT: 41.4 % (ref 35.0–45.0)
Hemoglobin: 14.1 g/dL (ref 11.7–15.5)
Lymphs Abs: 2860 cells/uL (ref 850–3900)
MCH: 29 pg (ref 27.0–33.0)
MCHC: 34.1 g/dL (ref 32.0–36.0)
MCV: 85.2 fL (ref 80.0–100.0)
MPV: 10.6 fL (ref 7.5–12.5)
Monocytes Relative: 10.6 %
Neutro Abs: 4840 cells/uL (ref 1500–7800)
Neutrophils Relative %: 55 %
Platelets: 231 10*3/uL (ref 140–400)
RBC: 4.86 10*6/uL (ref 3.80–5.10)
RDW: 13.4 % (ref 11.0–15.0)
Total Lymphocyte: 32.5 %
WBC: 8.8 10*3/uL (ref 3.8–10.8)

## 2019-08-26 LAB — COMPREHENSIVE METABOLIC PANEL
AG Ratio: 1.6 (calc) (ref 1.0–2.5)
ALT: 12 U/L (ref 6–29)
AST: 12 U/L (ref 10–35)
Albumin: 4.4 g/dL (ref 3.6–5.1)
Alkaline phosphatase (APISO): 74 U/L (ref 37–153)
BUN: 14 mg/dL (ref 7–25)
CO2: 26 mmol/L (ref 20–32)
Calcium: 9.1 mg/dL (ref 8.6–10.4)
Chloride: 105 mmol/L (ref 98–110)
Creat: 0.8 mg/dL (ref 0.50–0.99)
Globulin: 2.7 g/dL (calc) (ref 1.9–3.7)
Glucose, Bld: 80 mg/dL (ref 65–99)
Potassium: 4.3 mmol/L (ref 3.5–5.3)
Sodium: 141 mmol/L (ref 135–146)
Total Bilirubin: 0.4 mg/dL (ref 0.2–1.2)
Total Protein: 7.1 g/dL (ref 6.1–8.1)

## 2019-08-26 LAB — TSH: TSH: 1.54 mIU/L (ref 0.40–4.50)

## 2019-08-26 LAB — VITAMIN B12: Vitamin B-12: 235 pg/mL (ref 200–1100)

## 2019-08-27 ENCOUNTER — Encounter: Payer: Self-pay | Admitting: Family Medicine

## 2019-08-27 NOTE — Assessment & Plan Note (Signed)
Recommend she stay on PPI, benefits outweight risk in her case due to symptoms Note she had already discussed this with GI and they told her the same thing

## 2019-08-27 NOTE — Assessment & Plan Note (Signed)
Controlled no changes  fasting labs today Recheck TFT for thyroid replacement, continue oral meds

## 2019-08-28 ENCOUNTER — Telehealth: Payer: Self-pay | Admitting: Family Medicine

## 2019-08-28 NOTE — Telephone Encounter (Signed)
Please see labs for more documentation.

## 2019-08-28 NOTE — Telephone Encounter (Signed)
Patient calling to get bloodwork resutls, she does not understand the results on my chart  [phone]

## 2019-08-31 ENCOUNTER — Ambulatory Visit (HOSPITAL_COMMUNITY)
Admission: RE | Admit: 2019-08-31 | Discharge: 2019-08-31 | Disposition: A | Discharge status: Home / Self Care | Payer: PPO | Source: Ambulatory Visit | Attending: Family Medicine | Admitting: Family Medicine

## 2019-08-31 ENCOUNTER — Other Ambulatory Visit: Payer: Self-pay

## 2019-08-31 DIAGNOSIS — Z1231 Encounter for screening mammogram for malignant neoplasm of breast: Secondary | ICD-10-CM | POA: Diagnosis not present

## 2019-09-01 ENCOUNTER — Ambulatory Visit (HOSPITAL_COMMUNITY): Payer: Medicare Other

## 2019-09-27 ENCOUNTER — Ambulatory Visit: Payer: PPO | Admitting: Gastroenterology

## 2019-09-30 ENCOUNTER — Encounter: Payer: Self-pay | Admitting: Gastroenterology

## 2019-09-30 NOTE — Progress Notes (Signed)
Referring Provider: Alycia Rossetti, MD Primary Care Physician:  Alycia Rossetti, MD Primary GI Physician: Dr. Gala Romney  Chief Complaint  Patient presents with  . Gastroesophageal Reflux    HPI:   Jill Roach is a 69 y.o. female presenting today with a history of GERD and globus sensation. Last EGD in 2018 with LA grade B esophagitis s/p dilation, normal stomach and duodenum. BPE 2018 with moderate to marked esophageal dysmotility. Manometry and ambulatory esophageal 24 hour pH monitoring in 2004 negative.  ENT evaluation in 2019 for globus sensation was related to LPR.  Colonoscopy in 2018 with diverticulosis, otherwise normal. Repeat in 2028. She presents today for follow-up.    Last seen in our office in June 2020.  She had improvement in globus sensation on Protonix.  Had been on Protonix twice daily from September 2019 to March 2020 but decrease to once daily thereafter and continued to do well.  No other significant upper GI symptoms.  Occasional postprandial diarrhea.  No alarm symptoms.  Advised to continue Protonix daily and follow-up in 6 months.  Today:   GERD: Occasional breakthrough reflux. Only 2-3 times since last OV.   Globus Sensation: Started coming back around Christmas. Had COVID around Christmas. Sensation present several days a week. There are some days that she doesn't feel it.   No dysphagia. No nausea or vomiting. No abdominal pain. No blood in the stool or melena. Took tylenol when she had COVID. No NSAIDs.   Mild constipation at times since COVID. BM typically daily, may go 2 days without a BM. Sometimes feels incomplete. Not taking anything for this. Drinks about 1 bottle of water a day. Otherwise, drinks tea and coffee. Diet soda at lunch. If going out to eat, she will have some urgent diarrhea. Usually just one BM at that time.    Past Medical History:  Diagnosis Date  . Allergy   . Cataract   . GERD (gastroesophageal reflux disease)   .  Hyperlipidemia   . Hypertension   . PONV (postoperative nausea and vomiting)   . Thyroid disease     Past Surgical History:  Procedure Laterality Date  . ambulatory esophageal 24 hour pH study  2004   within normal range. Dr. Johnathan Hausen  . COLONOSCOPY N/A 11/04/2016   Procedure: COLONOSCOPY;  Surgeon: Daneil Dolin, MD; diverticulosis in the sigmoid and descending colon, otherwise normal.  Repeat in 2028.  Marland Kitchen ESOPHAGEAL MANOMETRY  2004   Dr. Johnathan Hausen: normal  . ESOPHAGOGASTRODUODENOSCOPY  2003   Dr. Johnathan Hausen: normal   . ESOPHAGOGASTRODUODENOSCOPY N/A 11/04/2016   Procedure: ESOPHAGOGASTRODUODENOSCOPY (EGD);  Surgeon: Daneil Dolin, MD; LA grade B esophagitis s/p dilation, normal stomach and duodenum.  Marland Kitchen EYE SURGERY     cataracts  . FRACTURE SURGERY     cataract surgery   . LIGAMENT REPAIR     rt knee  . MALONEY DILATION N/A 11/04/2016   Procedure: Venia Minks DILATION;  Surgeon: Daneil Dolin, MD;  Location: [facility];  Service: Endoscopy;  Laterality: N/A;  . TUBAL LIGATION      Current Outpatient Medications  Medication Sig Dispense Refill  . Calcium-Magnesium-Vitamin D (CALCIUM 1200+D3 PO) Take 1 tablet by mouth daily.    . fluticasone (FLONASE) 50 MCG/ACT nasal spray Place 2 sprays into both nostrils daily. 16 g 6  . levocetirizine (XYZAL) 5 MG tablet TAKE 1 TABLET BY MOUTH ONCE DAILY IN THE EVENING 90 tablet 0  . levothyroxine (  SYNTHROID) 88 MCG tablet Take 1 tablet by mouth once daily 90 tablet 0  . lisinopril-hydrochlorothiazide (ZESTORETIC) 20-12.5 MG tablet Take 1 tablet by mouth once daily 90 tablet 1  . montelukast (SINGULAIR) 10 MG tablet TAKE 1 TABLET BY MOUTH AT BEDTIME 90 tablet 1  . pantoprazole (PROTONIX) 40 MG tablet Take 1 tablet (40 mg total) by mouth daily. 90 tablet 3  . traZODone (DESYREL) 50 MG tablet TAKE 1/2 TO 1 (ONE-HALF TO ONE) TABLET BY MOUTH AT BEDTIME AS NEEDED FOR SLEEP 30 tablet 0   Current Facility-Administered Medications    Medication Dose Route Frequency Provider Last Rate Last Admin  . cyanocobalamin ((VITAMIN B-12)) injection 1,000 mcg  1,000 mcg Intramuscular Q30 days Alycia Rossetti, MD   1,000 mcg at 08/25/19 1237    Allergies as of 10/02/2019 - Review Complete 10/02/2019  Allergen Reaction Noted  . Codeine Nausea Only 09/20/2015    Family History  Problem Relation Age of Onset  . Hypertension Mother   . Arthritis Father        Rheumatoid   . Diabetes Father   . Hypertension Father   . Thyroid disease Father   . Arthritis Brother        Rheumatoid  . Thyroid disease Brother   . Heart disease Maternal Grandmother        stomach cancer  . Hypertension Maternal Grandmother   . Cancer Paternal Grandmother        LEUKEMIA/LUNG CNACER,  . Thyroid disease Paternal Grandmother   . Hypertension Paternal Grandmother   . Cancer Other   . Mental illness Other   . Cancer Maternal Uncle   . Thyroid disease Paternal Aunt   . Colon cancer Neg Hx     Social History   Socioeconomic History  . Marital status: Divorced    Spouse name: Not on file  . Number of children: Not on file  . Years of education: Not on file  . Highest education level: Not on file  Occupational History  . Not on file  Tobacco Use  . Smoking status: Never Smoker  . Smokeless tobacco: Never Used  Substance and Sexual Activity  . Alcohol use: No  . Drug use: No  . Sexual activity: Not Currently  Other Topics Concern  . Not on file  Social History Narrative  . Not on file   Social Determinants of Health   Financial Resource Strain:   . Difficulty of Paying Living Expenses: Not on file  Food Insecurity:   . Worried About Charity fundraiser in the Last Year: Not on file  . Ran Out of Food in the Last Year: Not on file  Transportation Needs:   . Lack of Transportation (Medical): Not on file  . Lack of Transportation (Non-Medical): Not on file  Physical Activity:   . Days of Exercise per Week: Not on file  .  Minutes of Exercise per Session: Not on file  Stress:   . Feeling of Stress : Not on file  Social Connections:   . Frequency of Communication with Friends and Family: Not on file  . Frequency of Social Gatherings with Friends and Family: Not on file  . Attends Religious Services: Not on file  . Active Member of Clubs or Organizations: Not on file  . Attends Archivist Meetings: Not on file  . Marital Status: Not on file    Review of Systems: Gen: Denies fever, chills, pre-syncope or syncope.  CV: Denies  chest pain or palpitations.  Resp: Denies shortness of breath. Admits to chronic occasional dry cough.  GI: See HPI Derm: Denies rash Psych: Denies depression or anxiety Heme: Denies bruising or bleeding  Physical Exam: BP (!) 172/80   Pulse 75   Temp (!) 97 F (36.1 C) (Temporal)   Ht 5' 4.5" (1.638 m)   Wt 177 lb 3.2 oz (80.4 kg)   BMI 29.95 kg/m  General:   Alert and oriented. No distress noted. Pleasant and cooperative.  Head:  Normocephalic and atraumatic. Eyes:  Conjuctiva clear without scleral icterus. Heart:  S1, S2 present without murmurs appreciated. Lungs:  Clear to auscultation bilaterally. No wheezes, rales, or rhonchi. No distress.  Abdomen:  +BS, soft, non-tender and non-distended. No rebound or guarding. No HSM or masses noted. Msk:  Symmetrical without gross deformities. Normal posture. Extremities:  Without edema. Neurologic:  Alert and  oriented x4 Psych: Normal mood and affect.

## 2019-10-02 ENCOUNTER — Other Ambulatory Visit: Payer: Self-pay

## 2019-10-02 ENCOUNTER — Encounter: Payer: Self-pay | Admitting: Gastroenterology

## 2019-10-02 ENCOUNTER — Ambulatory Visit (INDEPENDENT_AMBULATORY_CARE_PROVIDER_SITE_OTHER): Payer: PPO | Admitting: Gastroenterology

## 2019-10-02 ENCOUNTER — Ambulatory Visit (INDEPENDENT_AMBULATORY_CARE_PROVIDER_SITE_OTHER): Payer: PPO

## 2019-10-02 VITALS — BP 172/80 | HR 75 | Temp 97.0°F | Ht 64.5 in | Wt 177.2 lb

## 2019-10-02 DIAGNOSIS — K5900 Constipation, unspecified: Secondary | ICD-10-CM | POA: Diagnosis not present

## 2019-10-02 DIAGNOSIS — E538 Deficiency of other specified B group vitamins: Secondary | ICD-10-CM | POA: Diagnosis not present

## 2019-10-02 DIAGNOSIS — R0989 Other specified symptoms and signs involving the circulatory and respiratory systems: Secondary | ICD-10-CM

## 2019-10-02 DIAGNOSIS — K219 Gastro-esophageal reflux disease without esophagitis: Secondary | ICD-10-CM

## 2019-10-02 DIAGNOSIS — R198 Other specified symptoms and signs involving the digestive system and abdomen: Secondary | ICD-10-CM

## 2019-10-02 MED ORDER — PANTOPRAZOLE SODIUM 40 MG PO TBEC: 40.0000 mg | DELAYED_RELEASE_TABLET | Freq: Two times a day (BID) | ORAL | 5 refills | Status: DC

## 2019-10-02 NOTE — Assessment & Plan Note (Signed)
Typical GERD symptoms are well controlled on Protonix 40 mg daily.  However, due to return of globus sensation since decreasing Protonix to once daily, I will have her resume taking Protonix twice daily 30 minutes before breakfast and dinner.  I suspect she may have silent reflux contributing to her globus sensation.  No dysphagia or other alarm symptoms.  Increase Protonix to 40 mg twice daily 30 minutes before breakfast and dinner. Follow-up in 6 months.  Call if questions or concerns prior.

## 2019-10-02 NOTE — Assessment & Plan Note (Addendum)
69 year old female with history of globus sensation.  Extensive work-up in the past dating back to 2003 with manometry and ambulatory esophageal 24-hour pH monitoring that was negative.  BPE in 2018 with esophageal dysmotility and EGD with esophagitis and empiric dilation.  ENT evaluation in 2019 who felt there was a component of LPR.  Symptoms resolved on Protonix twice daily.  She decrease Protonix to once daily in March 2020 and was doing well at her last visit in June 2020.  Unfortunately, she had return of globus sensation in December 2020.  Sensation is present several days a week and is becoming more frequent.  GERD symptoms are well controlled.  Suspect she may have silent GERD symptoms that are contributing to her globus sensation.  No alarm symptoms.  Resume Protonix 40 mg twice daily 30 minutes before breakfast and dinner. Follow-up in 6 months.  She was advised to call if she had questions or concerns prior.

## 2019-10-02 NOTE — Patient Instructions (Addendum)
Be sure to take your blood pressure medication as soon as you get home.  Increase Protonix to 40 mg twice daily 30 minutes before breakfast and dinner. I have sent in an updated prescription.  Add Benefiber or Metamucil daily for constipation.  You may use MiraLAX 1 capful (17 g) daily in 8 ounces of water as needed for constipation.  Increase water intake to 3 bottles daily.  We will plan to see you back in 6 months.  Call if you have questions or concerns prior.  Aliene Altes, PA-C [facility]

## 2019-10-02 NOTE — Assessment & Plan Note (Signed)
Typically with BM daily but may go 2 days without a BM.  Also with sensation of incomplete evacuation at times.  Started after COVID-19 around Christmas 2020. No alarm symptoms.  Colonoscopy up-to-date in 2018 with diverticulosis, otherwise normal.  Repeat due in 2028.  Add Benefiber or Metamucil daily. Use MiraLAX 1 capful (17 g) daily in 8 ounces of water as needed. Increase water intake to 3 bottles daily. Follow-up in 6 months.

## 2019-10-06 ENCOUNTER — Other Ambulatory Visit: Payer: Self-pay | Admitting: Family Medicine

## 2019-10-24 ENCOUNTER — Telehealth: Payer: Self-pay | Admitting: *Deleted

## 2019-10-24 NOTE — Telephone Encounter (Signed)
Received call from patient.   Reports that she had COVID in 07/2019. States that she she did receive infusion on 07/29/2019.  Inquired as to when she can receive COVID vaccine.

## 2019-10-24 NOTE — Telephone Encounter (Signed)
Call placed to patient and patient made aware.  

## 2019-10-24 NOTE — Telephone Encounter (Signed)
It has been 3 months, so she can get her vaccine now

## 2019-11-05 ENCOUNTER — Other Ambulatory Visit: Payer: Self-pay | Admitting: Family Medicine

## 2019-11-05 DIAGNOSIS — J309 Allergic rhinitis, unspecified: Secondary | ICD-10-CM

## 2019-11-06 ENCOUNTER — Telehealth: Payer: Self-pay | Admitting: *Deleted

## 2019-11-06 NOTE — Telephone Encounter (Signed)
Received call from patient.   Reports that she received Moderna Vaccine on 11/01/2019. States that on 11/01/2019, she had nausea and diarrhea. Reports that she felt very fatigued on 11/02/2019. Reports that on 11/03/2019 she had arm swelling at the injection site. Reports that she also had increased pain and "burning" sensation to arm. States that she did ice arm, and pain/ swelling did resolve in approximately 24hrs. Reports that she did feel fatigued again on 11/04/2019. Reports that she is improved today.   States that she is concerned about taking the second COVID vaccine. Reports that she has CPE on 11/15/2019, nd will discuss with provider at that time.

## 2019-11-15 ENCOUNTER — Encounter: Payer: Self-pay | Admitting: Family Medicine

## 2019-11-15 ENCOUNTER — Ambulatory Visit (INDEPENDENT_AMBULATORY_CARE_PROVIDER_SITE_OTHER): Payer: PPO | Admitting: Family Medicine

## 2019-11-15 ENCOUNTER — Other Ambulatory Visit: Payer: Self-pay

## 2019-11-15 VITALS — BP 132/82 | HR 72 | Temp 97.5°F | Resp 14 | Ht 64.5 in | Wt 183.0 lb

## 2019-11-15 DIAGNOSIS — M8589 Other specified disorders of bone density and structure, multiple sites: Secondary | ICD-10-CM | POA: Diagnosis not present

## 2019-11-15 DIAGNOSIS — I10 Essential (primary) hypertension: Secondary | ICD-10-CM

## 2019-11-15 DIAGNOSIS — Z0001 Encounter for general adult medical examination with abnormal findings: Secondary | ICD-10-CM | POA: Diagnosis not present

## 2019-11-15 DIAGNOSIS — Z6833 Body mass index (BMI) 33.0-33.9, adult: Secondary | ICD-10-CM

## 2019-11-15 DIAGNOSIS — E038 Other specified hypothyroidism: Secondary | ICD-10-CM | POA: Diagnosis not present

## 2019-11-15 DIAGNOSIS — B070 Plantar wart: Secondary | ICD-10-CM | POA: Diagnosis not present

## 2019-11-15 DIAGNOSIS — E6609 Other obesity due to excess calories: Secondary | ICD-10-CM

## 2019-11-15 DIAGNOSIS — Z0000 Encounter for general adult medical examination without abnormal findings: Secondary | ICD-10-CM

## 2019-11-15 DIAGNOSIS — E538 Deficiency of other specified B group vitamins: Secondary | ICD-10-CM | POA: Insufficient documentation

## 2019-11-15 DIAGNOSIS — E782 Mixed hyperlipidemia: Secondary | ICD-10-CM | POA: Diagnosis not present

## 2019-11-15 DIAGNOSIS — K2100 Gastro-esophageal reflux disease with esophagitis, without bleeding: Secondary | ICD-10-CM | POA: Diagnosis not present

## 2019-11-15 DIAGNOSIS — F5104 Psychophysiologic insomnia: Secondary | ICD-10-CM | POA: Diagnosis not present

## 2019-11-15 MED ORDER — CYANOCOBALAMIN 1000 MCG/ML IJ SOLN
1000.0000 ug | Freq: Once | INTRAMUSCULAR | Status: AC
  Administered 2019-11-15: 1000 ug via INTRAMUSCULAR

## 2019-11-15 NOTE — Progress Notes (Signed)
Subjective:   Patient presents for Medicare Annual/Subsequent preventive examination.     Pt had COVID-19 shot given 2 weeks ago, she had body aches, chills, had swelling on upper arm at the site of the injection, went down with ice, also had nausea, symptoms lasted less than 24 hours   Left foot has tender spot on her sole on metarsal arch   She occ getss small bumps in the mouth,  She doesn't get cold sores.She had a bump under her tongue but it went away on its own. She is not sure if    Taking protonix twice a day for reflux symptoms    Trazodone for sleep 50mg  tablet works well most nights, she still stresses over the estate not settled between her and her ex husband   B12 deficiency due for B12 shot and repeat lab  Due fasting labs          Review Past Medical/Family/Social: Per EMR   Risk Factors  Current exercise habits: some walking Dietary issues discussed: Yes  Cardiac risk factors: Obesity (BMI >= 30 kg/m2). HTN, Hyperlipidemia   Depression Screen  (Note: if answer to either of the following is "Yes", a more complete depression screening is indicated)  Over the past two weeks, have you felt down, depressed or hopeless? No Over the past two weeks, have you felt little interest or pleasure in doing things? No Have you lost interest or pleasure in daily life? No Do you often feel hopeless? No Do you cry easily over simple problems? No   Activities of Daily Living  In your present state of health, do you have any difficulty performing the following activities?:  Driving? No  Managing money? No  Feeding yourself? No  Getting from bed to chair? No  Climbing a flight of stairs? No  Preparing food and eating?: No  Bathing or showering? No  Getting dressed: No  Getting to the toilet? No  Using the toilet:No  Moving around from place to place: No  In the past year have you fallen or had a near fall?:No  Are you sexually active? No  Do you have more than one  partner? No   Hearing Difficulties: Yes  Do you often ask people to speak up or repeat themselves? Sometimes  Do you experience ringing or noises in your ears? No Do you have difficulty understanding soft or whispered voices? Yes  Do you feel that you have a problem with memory? No Do you often misplace items? No  Do you feel safe at home? Yes  Cognitive Testing  Alert? Yes Normal Appearance?Yes  Oriented to person? Yes Place? Yes  Time? Yes  Recall of three objects? Yes  Can perform simple calculations? Yes  Displays appropriate judgment?Yes  Can read the correct time from a watch face?Yes   List the Names of Other Physician/Practitioners you currently use:    Screening Tests / Date Colonoscopy   UTD                  Zostavax  UTD Mammogram - UTD  Influenza Vaccine UTD Tetanus/tdap Pneumonia UTD COVID 19 #1 done Bone Density- due for repeat   ROS: GEN- denies fatigue, fever, weight loss,weakness, recent illness HEENT- denies eye drainage, change in vision, nasal discharge, CVS- denies chest pain, palpitations RESP- denies SOB, cough, wheeze ABD- denies N/V, change in stools, abd pain GU- denies dysuria, hematuria, dribbling, incontinence MSK- denies joint pain, muscle aches, injury Neuro- denies headache, dizziness, syncope,  seizure activity  Physical: vitals reviewed  GEN- NAD, alert and oriented x3 HEENT- PERRL, EOMI, non injected sclera, pink conjunctiva, MMM, oropharynx clear Neck- Supple, no thryomegaly CVS- RRR, no murmur RESP-CTAB ABD-NABS,soft, NT,ND EXT- No edema   left sole - beneath 4th digit at arch, small wart like lesion TTP, black dot noted  Pulses- Radial, DP- 2+   Assessment:    Annual wellness medicare exam   Plan:    During the course of the visit the patient was educated and counseled about appropriate screening and preventive services including:     Recommend she proceed with 2nd COVID-19 vaccine, she can pre medicate with pepcid,  benadryl, tylenol    wait at least 1 month after COVID vaccine  before getting TDAP from pharmacy    HTN controlled   B12 def, injection given, recheck labs   No oral lesions seen,advised if returns, can schedule here or with dentist, may have been a small cyst or oral ulcer   plantar wart - verbal consent obtained for cryotherapy. Liquid nitrogen applied via spray gun 5-10 sec x 3 passes, pt toelrated without difficulty, no bleeding  If no improvement, return and will try to debride a little bit    Full Code, given handout on advanced directives    Reflkux continue PPI    Hypothyroidism continue replacement check TSH    Osteopenia schedule Bone density , continue vitamin D/calcium   Chronic insomnia, continue trazodone, depression screen negative          Diet review for nutrition referral? Yes ____ Not Indicated __x__  Patient Instructions (the written plan) was given to the patient.  Medicare Attestation  I have personally reviewed:  The patient's medical and social history  Their use of alcohol, tobacco or illicit drugs  Their current medications and supplements  The patient's functional ability including ADLs,fall risks, home safety risks, cognitive, and hearing and visual impairment  Diet and physical activities  Evidence for depression or mood disorders  The patient's weight, height, BMI, and visual acuity have been recorded in the chart. I have made referrals, counseling, and provided education to the patient based on review of the above and I have provided the patient with a written personalized care plan for preventive services.

## 2019-11-15 NOTE — Patient Instructions (Addendum)
Before the vaccine take  Benadryl 25mg   Pepcid 20mg   TYLENOL 500mg     Schedule your bone density  Use wart medicine over the counter  F/U 4 months

## 2019-11-16 LAB — COMPREHENSIVE METABOLIC PANEL
AG Ratio: 1.7 (calc) (ref 1.0–2.5)
ALT: 19 U/L (ref 6–29)
AST: 16 U/L (ref 10–35)
Albumin: 4.3 g/dL (ref 3.6–5.1)
Alkaline phosphatase (APISO): 75 U/L (ref 37–153)
BUN: 11 mg/dL (ref 7–25)
CO2: 27 mmol/L (ref 20–32)
Calcium: 9.3 mg/dL (ref 8.6–10.4)
Chloride: 105 mmol/L (ref 98–110)
Creat: 0.77 mg/dL (ref 0.50–0.99)
Globulin: 2.6 g/dL (calc) (ref 1.9–3.7)
Glucose, Bld: 109 mg/dL — ABNORMAL HIGH (ref 65–99)
Potassium: 4.5 mmol/L (ref 3.5–5.3)
Sodium: 141 mmol/L (ref 135–146)
Total Bilirubin: 0.4 mg/dL (ref 0.2–1.2)
Total Protein: 6.9 g/dL (ref 6.1–8.1)

## 2019-11-16 LAB — TSH: TSH: 1.95 mIU/L (ref 0.40–4.50)

## 2019-11-16 LAB — CBC WITH DIFFERENTIAL/PLATELET
Absolute Monocytes: 625 cells/uL (ref 200–950)
Basophils Absolute: 50 cells/uL (ref 0–200)
Basophils Relative: 0.7 %
Eosinophils Absolute: 99 cells/uL (ref 15–500)
Eosinophils Relative: 1.4 %
HCT: 40 % (ref 35.0–45.0)
Hemoglobin: 13.5 g/dL (ref 11.7–15.5)
Lymphs Abs: 1981 cells/uL (ref 850–3900)
MCH: 28.8 pg (ref 27.0–33.0)
MCHC: 33.8 g/dL (ref 32.0–36.0)
MCV: 85.3 fL (ref 80.0–100.0)
MPV: 10.2 fL (ref 7.5–12.5)
Monocytes Relative: 8.8 %
Neutro Abs: 4345 cells/uL (ref 1500–7800)
Neutrophils Relative %: 61.2 %
Platelets: 275 10*3/uL (ref 140–400)
RBC: 4.69 10*6/uL (ref 3.80–5.10)
RDW: 13.9 % (ref 11.0–15.0)
Total Lymphocyte: 27.9 %
WBC: 7.1 10*3/uL (ref 3.8–10.8)

## 2019-11-16 LAB — LIPID PANEL
Cholesterol: 134 mg/dL (ref <200)
HDL: 42 mg/dL — ABNORMAL LOW (ref >=50)
LDL Cholesterol (Calc): 67 mg/dL (calc)
Non-HDL Cholesterol (Calc): 92 mg/dL (calc) (ref <130)
Total CHOL/HDL Ratio: 3.2 (calc) (ref <5.0)
Triglycerides: 186 mg/dL — ABNORMAL HIGH (ref <150)

## 2019-11-16 LAB — VITAMIN B12: Vitamin B-12: >2000 pg/mL — ABNORMAL HIGH (ref 200–1100)

## 2019-12-06 ENCOUNTER — Other Ambulatory Visit: Payer: Self-pay | Admitting: Family Medicine

## 2019-12-20 ENCOUNTER — Encounter: Payer: Self-pay | Admitting: Family Medicine

## 2019-12-20 ENCOUNTER — Ambulatory Visit (INDEPENDENT_AMBULATORY_CARE_PROVIDER_SITE_OTHER): Payer: PPO | Admitting: Family Medicine

## 2019-12-20 ENCOUNTER — Other Ambulatory Visit: Payer: Self-pay

## 2019-12-20 VITALS — BP 134/80 | HR 88 | Temp 98.0°F | Resp 14 | Ht 64.5 in | Wt 183.0 lb

## 2019-12-20 DIAGNOSIS — E538 Deficiency of other specified B group vitamins: Secondary | ICD-10-CM

## 2019-12-20 DIAGNOSIS — H6502 Acute serous otitis media, left ear: Secondary | ICD-10-CM

## 2019-12-20 DIAGNOSIS — H9201 Otalgia, right ear: Secondary | ICD-10-CM

## 2019-12-20 DIAGNOSIS — H6122 Impacted cerumen, left ear: Secondary | ICD-10-CM

## 2019-12-20 DIAGNOSIS — J301 Allergic rhinitis due to pollen: Secondary | ICD-10-CM

## 2019-12-20 MED ORDER — CYANOCOBALAMIN 1000 MCG/ML IJ SOLN
1000.0000 ug | Freq: Once | INTRAMUSCULAR | Status: AC
  Administered 2019-12-20: 1000 ug via INTRAMUSCULAR

## 2019-12-20 MED ORDER — AMOXICILLIN 875 MG PO TABS: 875.0000 mg | ORAL_TABLET | Freq: Two times a day (BID) | ORAL | 0 refills | Status: DC

## 2019-12-20 MED ORDER — PREDNISONE 20 MG PO TABS: 20.0000 mg | ORAL_TABLET | Freq: Every day | ORAL | 0 refills | Status: DC

## 2019-12-20 NOTE — Patient Instructions (Addendum)
Take B12 oral 1026mcg once a day  Take the steroids for 5 days Take antibiotics for ear infection F/U as needed

## 2019-12-20 NOTE — Assessment & Plan Note (Signed)
B12 injection given, will start oral B12 in 1 month, 1000MCG once a day

## 2019-12-20 NOTE — Addendum Note (Signed)
Addended by: Sheral Flow on: 12/20/2019 03:38 PM   Modules accepted: Orders

## 2019-12-20 NOTE — Progress Notes (Signed)
   Subjective:    Patient ID: Jill Roach, female    DOB: May 14, 1951, 69 y.o.   MRN: [id number]  Patient presents for L Ear Pressure (has been having some sinus drainage, and noticed ear pressure and slight loss of hearing in L ear- noted wax in canal- R ear having some slight discomfort) and Injections (Vit B) Patient here with continued sinus drainage along with pressure in her left ear and twinges of pain in her right ear.  The left ear just feels clogged AND FULL, but she has not had twinges of pain in that one .  The right ear she started noticing twinges of pain yesterday.  She has underlying allergies has been taking Xyzal and Singulair which has been controlling most of her symptoms.  She does not have any cough or congestion no fever no headache no body aches.  She still has mild runny nose.  Due for B12 shot    Review Of Systems:  GEN- denies fatigue, fever, weight loss,weakness, recent illness HEENT- denies eye drainage, change in vision, +nasal discharge, CVS- denies chest pain, palpitations RESP- denies SOB, cough, wheeze ABD- denies N/V, change in stools, abd pain GU- denies dysuria, hematuria, dribbling, incontinence MSK- denies joint pain, muscle aches, injury Neuro- denies headache, dizziness, syncope, seizure activity       Objective:    BP 134/80   Pulse 88   Temp 98 F (36.7 C) (Temporal)   Resp 14   Ht 5' 4.5" (1.638 m)   Wt 183 lb (83 kg)   SpO2 97%   BMI 30.93 kg/m  GEN- NAD, alert and oriented x3 HEENT- PERRL, EOMI, non injected sclera, pink conjunctiva, MMM, oropharynx clear, TM clear bilat no effusion, cerumen in left canal  obscuring left TM,    s/p cerumen removal left TM   with erythema, decreased light reflux  No  maxillary sinus tenderness, inflammed turbinates,  Nasal drainage  Neck- Supple, no LAD CVS- RRR, no murmur RESP-CTAB Pulses- Radial 2+          Assessment & Plan:      Problem List Items Addressed This Visit       Unprioritized   B12 deficiency    B12 injection given, will start oral B12 in 1 month, 1000MCG once a day        Other Visit Diagnoses    Impacted cerumen of left ear    -  Primary   s/p irrigation, cerumen removed    Seasonal allergic rhinitis due to pollen       continue xyzal, singulair, add prednisone for 5 days for inflammation and otalgia, no sign of bacterial sinus  infection, add nasal saline   Acute otalgia, right       Non-recurrent acute serous otitis media of left ear       Relevant Medications   amoxicillin (AMOXIL) 875 MG tablet      Note: This dictation was prepared with Dragon dictation along with smaller phrase technology. Any transcriptional errors that result from this process are unintentional.

## 2019-12-28 IMAGING — MG DIGITAL SCREENING BILATERAL MAMMOGRAM WITH TOMO AND CAD
8 series · 8 of 24 positions shown · non-contrast
Comparison: Previous exam(s).

CLINICAL DATA: Screening.

EXAM:
DIGITAL SCREENING BILATERAL MAMMOGRAM WITH TOMO AND CAD

[R CC synth-2D]
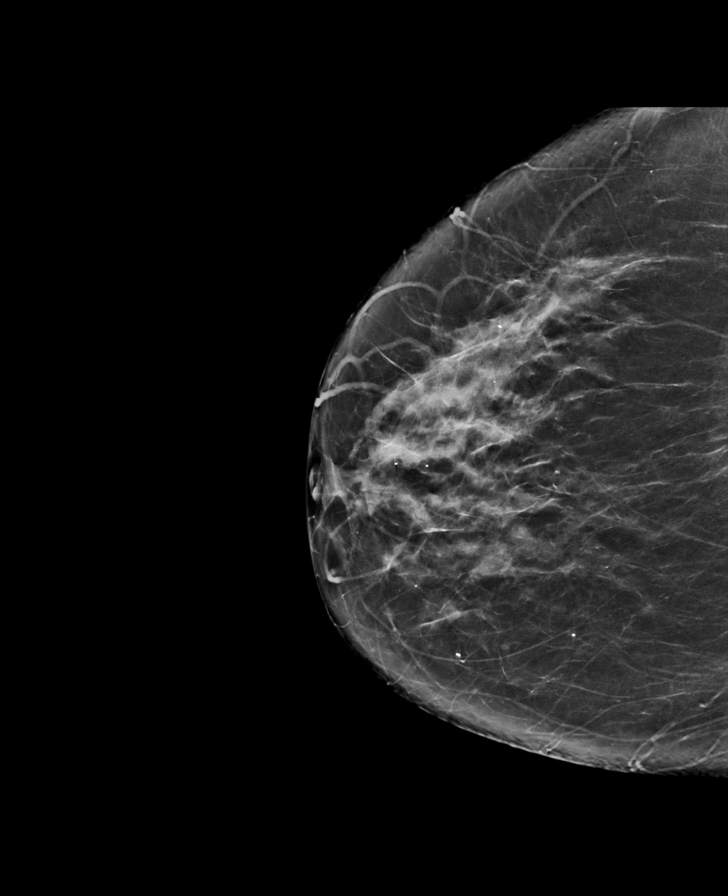

[R MLO synth-2D]
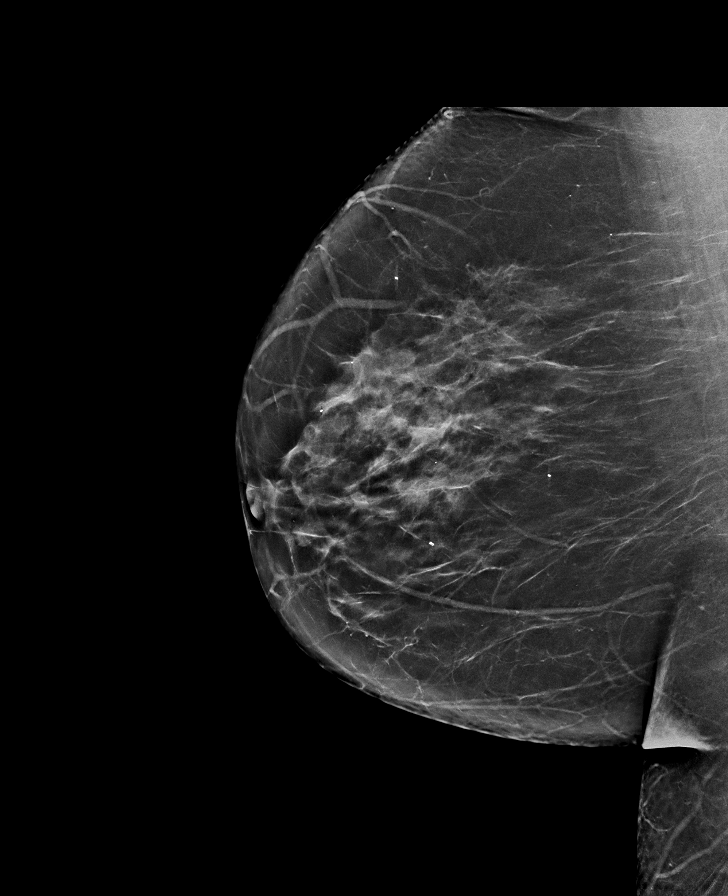

[L CC synth-2D]
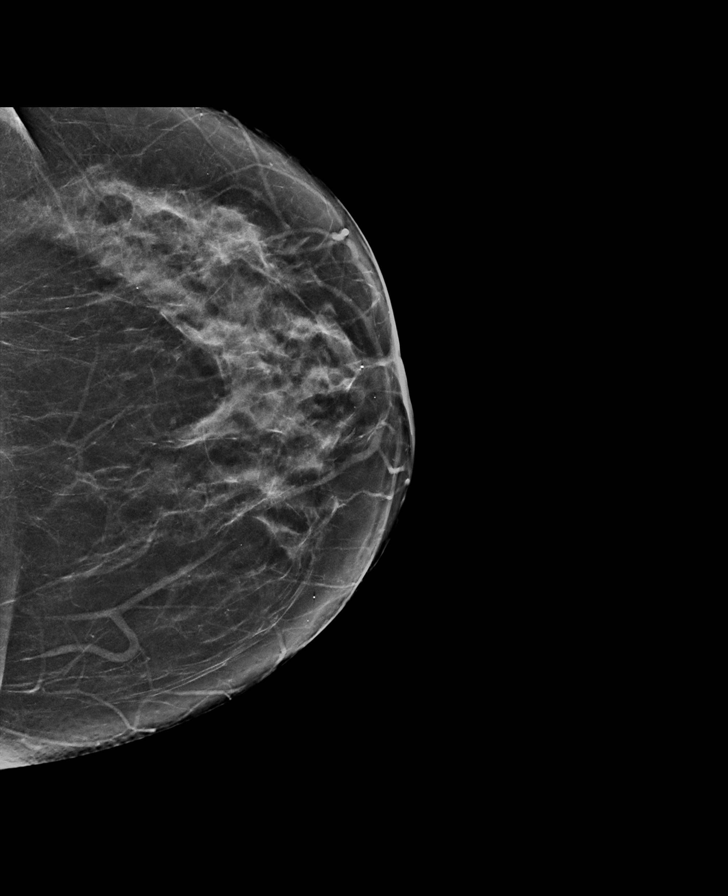

[L MLO synth-2D]
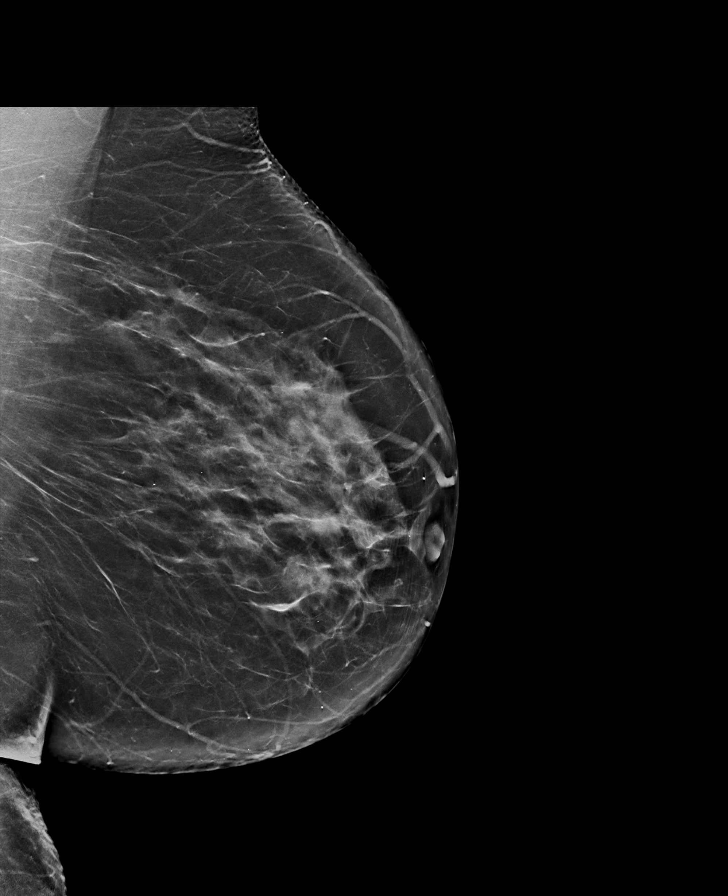

[L MLO tomo · tomo slice 39/77.0]
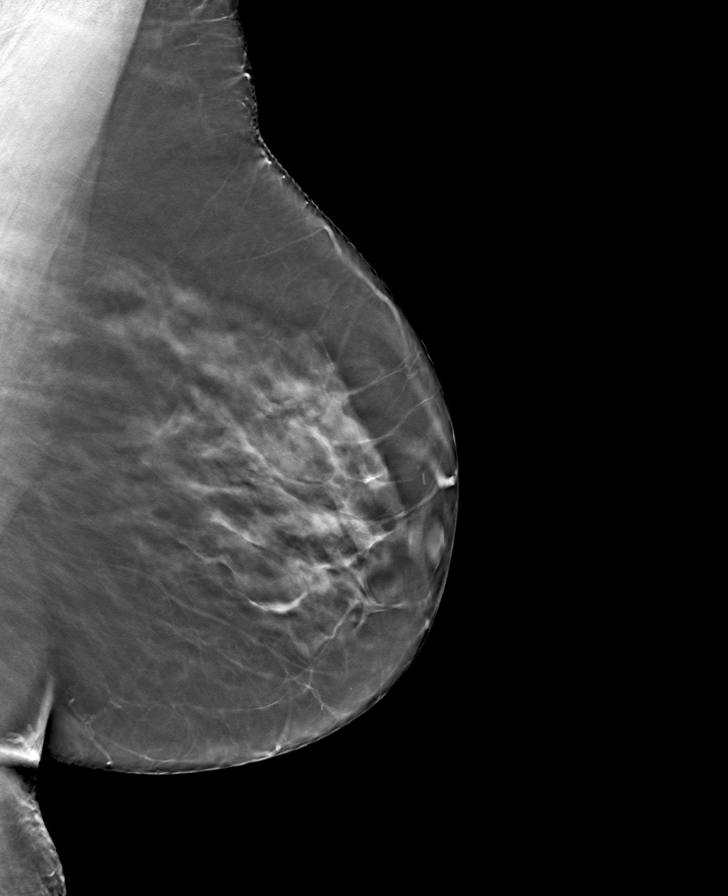

[L CC tomo · tomo slice 37/72.0]
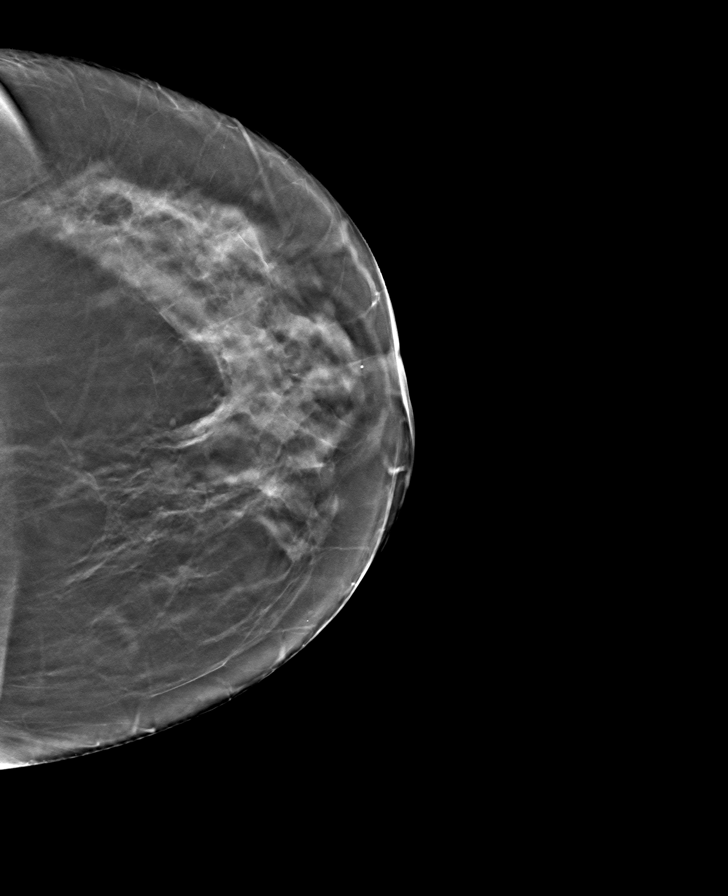

[R CC tomo · tomo slice 35/69.0]
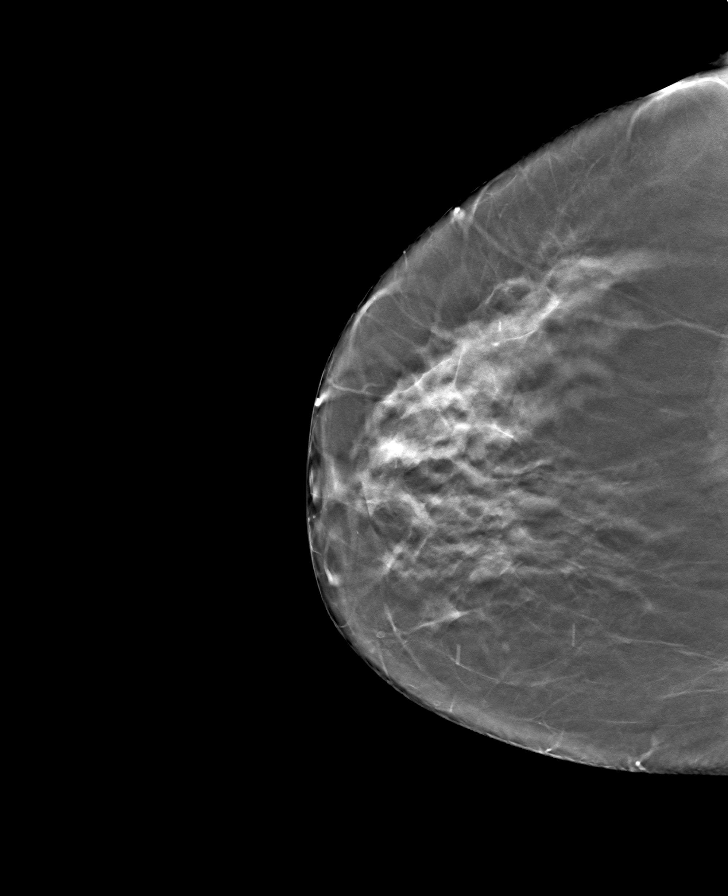

[R MLO tomo · tomo slice 37/74.0]
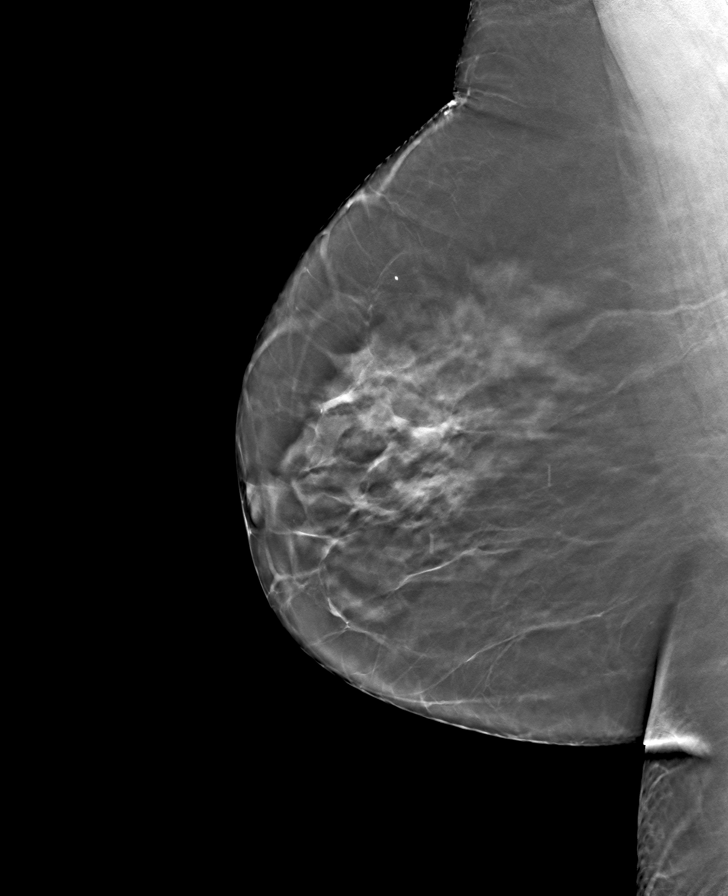

[8 of 24 positions shown; findings below may reference images not displayed]

ACR Breast Density Category c: The breast tissue is heterogeneously
dense, which may obscure small masses.
FINDINGS: There are no findings suspicious for malignancy. Images were
processed with CAD.
IMPRESSION: No mammographic evidence of malignancy. A result letter of this
screening mammogram will be mailed directly to the patient.

RECOMMENDATION:
Screening mammogram in one year. (Code:[id number]-U-LHB)

BI-RADS CATEGORY  1: Negative.

## 2020-01-04 ENCOUNTER — Other Ambulatory Visit: Payer: Self-pay | Admitting: Family Medicine

## 2020-02-02 ENCOUNTER — Other Ambulatory Visit: Payer: Self-pay | Admitting: Family Medicine

## 2020-02-02 DIAGNOSIS — J309 Allergic rhinitis, unspecified: Secondary | ICD-10-CM

## 2020-02-06 ENCOUNTER — Telehealth: Payer: Self-pay | Admitting: Obstetrics & Gynecology

## 2020-02-06 NOTE — Telephone Encounter (Signed)
Patient wants someone to call her because she is having the same issue she had a year ago, and wants to know can she get refill for the prescription that was prescribed to her.

## 2020-02-06 NOTE — Telephone Encounter (Signed)
Telephoned patient at home number. Patient states having some vaginal irritation and burning. Will try monistat cream otc until her appointment. Patient will call in a few days to see if there is a cancellation. Patient voiced understanding.

## 2020-02-06 NOTE — Telephone Encounter (Signed)
Telephone patient at home number and voicemail box is full. Sent mychart message to patient.

## 2020-02-06 NOTE — Telephone Encounter (Signed)
Patient returned nurse call.

## 2020-02-06 NOTE — Telephone Encounter (Signed)
Patient would like for you to give her a call.

## 2020-02-19 ENCOUNTER — Encounter: Payer: Self-pay | Admitting: Obstetrics & Gynecology

## 2020-02-19 ENCOUNTER — Ambulatory Visit: Payer: PPO | Admitting: Obstetrics & Gynecology

## 2020-02-19 VITALS — BP 132/83 | HR 76 | Ht 64.0 in | Wt 184.0 lb

## 2020-02-19 DIAGNOSIS — N762 Acute vulvitis: Secondary | ICD-10-CM

## 2020-02-19 DIAGNOSIS — R739 Hyperglycemia, unspecified: Secondary | ICD-10-CM | POA: Diagnosis not present

## 2020-02-19 MED ORDER — TERCONAZOLE 0.4 % VA CREA: 1.0000 | TOPICAL_CREAM | Freq: Every day | VAGINAL | 0 refills | Status: DC

## 2020-02-19 NOTE — Progress Notes (Signed)
Chief Complaint  Patient presents with  . Vaginal Irritation      69 y.o. [id number] No LMP recorded. Patient is postmenopausal. The current method of family planning is post menopausal status.  Outpatient Encounter Medications as of 02/19/2020  Medication Sig  . Ascorbic Acid (VITAMIN C) 100 MG tablet Take 100 mg by mouth daily.  . Calcium-Magnesium-Vitamin D (CALCIUM 1200+D3 PO) Take 1 tablet by mouth daily.  [name] 88 MCG tablet Take 1 tablet by mouth once daily  . levocetirizine (XYZAL) 5 MG tablet TAKE 1 TABLET BY MOUTH ONCE DAILY IN THE EVENING  . lisinopril-hydrochlorothiazide (ZESTORETIC) 20-12.5 MG tablet Take 1 tablet by mouth once daily  . montelukast (SINGULAIR) 10 MG tablet TAKE 1 TABLET BY MOUTH AT BEDTIME  . pantoprazole (PROTONIX) 40 MG tablet Take 1 tablet (40 mg total) by mouth 2 (two) times daily before a meal.  . traZODone (DESYREL) 50 MG tablet TAKE 1/2 TO 1 (ONE-HALF TO ONE) TABLET BY MOUTH AT BEDTIME AS NEEDED FOR SLEEP  . Zinc 50 MG TABS Take by mouth daily.  Marland Kitchen terconazole (TERAZOL 7) 0.4 % vaginal cream Place 1 applicator vaginally at bedtime.  . [DISCONTINUED] amoxicillin (AMOXIL) 875 MG tablet Take 1 tablet (875 mg total) by mouth 2 (two) times daily.  . [DISCONTINUED] predniSONE (DELTASONE) 20 MG tablet Take 1 tablet (20 mg total) by mouth daily with breakfast.   No facility-administered encounter medications on file as of 02/19/2020.    Subjective Pt recently went to her grandson's pool and had itching afterwards No discharge Itching is moderate not worse at night CMP reveals elevated glucose will check Past Medical History:  Diagnosis Date  . Allergy   . Cataract   . GERD (gastroesophageal reflux disease)   . Hyperlipidemia   . Hypertension   . PONV (postoperative nausea and vomiting)   . Thyroid disease     Past Surgical History:  Procedure Laterality Date  . ambulatory esophageal 24 hour pH study  2004   within normal range. Dr.  Johnathan Hausen  . COLONOSCOPY N/A 11/04/2016   Procedure: COLONOSCOPY;  Surgeon: Daneil Dolin, MD; diverticulosis in the sigmoid and descending colon, otherwise normal.  Repeat in 2028.  Marland Kitchen ESOPHAGEAL MANOMETRY  2004   Dr. Johnathan Hausen: normal  . ESOPHAGOGASTRODUODENOSCOPY  2003   Dr. Johnathan Hausen: normal   . ESOPHAGOGASTRODUODENOSCOPY N/A 11/04/2016   Procedure: ESOPHAGOGASTRODUODENOSCOPY (EGD);  Surgeon: Daneil Dolin, MD; LA grade B esophagitis s/p dilation, normal stomach and duodenum.  Marland Kitchen EYE SURGERY     cataracts  . FRACTURE SURGERY     cataract surgery   . LIGAMENT REPAIR     rt knee  . MALONEY DILATION N/A 11/04/2016   Procedure: Venia Minks DILATION;  Surgeon: Daneil Dolin, MD;  Location: [facility];  Service: Endoscopy;  Laterality: N/A;  . TUBAL LIGATION      OB History    Gravida  3   Para  3   Term  3   Preterm      AB      Living  3     SAB      TAB      Ectopic      Multiple      Live Births  3           Allergies  Allergen Reactions  . Codeine Nausea Only    Social History   Socioeconomic History  . Marital status: Divorced  Spouse name: Not on file  . Number of children: Not on file  . Years of education: Not on file  . Highest education level: Not on file  Occupational History  . Not on file  Tobacco Use  . Smoking status: Never Smoker  . Smokeless tobacco: Never Used  Vaping Use  . Vaping Use: Never used  Substance and Sexual Activity  . Alcohol use: No  . Drug use: No  . Sexual activity: Not Currently  Other Topics Concern  . Not on file  Social History Narrative  . Not on file   Social Determinants of Health   Financial Resource Strain:   . Difficulty of Paying Living Expenses:   Food Insecurity:   . Worried About Charity fundraiser in the Last Year:   . Arboriculturist in the Last Year:   Transportation Needs:   . Film/video editor (Medical):   Marland Kitchen Lack of Transportation (Non-Medical):   Physical  Activity:   . Days of Exercise per Week:   . Minutes of Exercise per Session:   Stress:   . Feeling of Stress :   Social Connections:   . Frequency of Communication with Friends and Family:   . Frequency of Social Gatherings with Friends and Family:   . Attends Religious Services:   . Active Member of Clubs or Organizations:   . Attends Archivist Meetings:   Marland Kitchen Marital Status:     Family History  Problem Relation Age of Onset  . Hypertension Mother   . Arthritis Father        Rheumatoid   . Diabetes Father   . Hypertension Father   . Thyroid disease Father   . Arthritis Brother        Rheumatoid  . Thyroid disease Brother   . Heart disease Maternal Grandmother        stomach cancer  . Hypertension Maternal Grandmother   . Cancer Paternal Grandmother        LEUKEMIA/LUNG CNACER,  . Thyroid disease Paternal Grandmother   . Hypertension Paternal Grandmother   . Cancer Other   . Mental illness Other   . Cancer Maternal Uncle   . Thyroid disease Paternal Aunt   . Colon cancer Neg Hx     Medications:       Current Outpatient Medications:  .  Ascorbic Acid (VITAMIN C) 100 MG tablet, Take 100 mg by mouth daily., Disp: , Rfl:  .  Calcium-Magnesium-Vitamin D (CALCIUM 1200+D3 PO), Take 1 tablet by mouth daily., Disp: , Rfl:  .  EUTHYROX 88 MCG tablet, Take 1 tablet by mouth once daily, Disp: 90 tablet, Rfl: 0 .  levocetirizine (XYZAL) 5 MG tablet, TAKE 1 TABLET BY MOUTH ONCE DAILY IN THE EVENING, Disp: 90 tablet, Rfl: 0 .  lisinopril-hydrochlorothiazide (ZESTORETIC) 20-12.5 MG tablet, Take 1 tablet by mouth once daily, Disp: 90 tablet, Rfl: 1 .  montelukast (SINGULAIR) 10 MG tablet, TAKE 1 TABLET BY MOUTH AT BEDTIME, Disp: 90 tablet, Rfl: 0 .  pantoprazole (PROTONIX) 40 MG tablet, Take 1 tablet (40 mg total) by mouth 2 (two) times daily before a meal., Disp: 60 tablet, Rfl: 5 .  traZODone (DESYREL) 50 MG tablet, TAKE 1/2 TO 1 (ONE-HALF TO ONE) TABLET BY MOUTH AT BEDTIME  AS NEEDED FOR SLEEP, Disp: 30 tablet, Rfl: 0 .  Zinc 50 MG TABS, Take by mouth daily., Disp: , Rfl:  .  terconazole (TERAZOL 7) 0.4 % vaginal cream, Place  1 applicator vaginally at bedtime., Disp: 45 g, Rfl: 0  Objective Blood pressure 132/83, pulse 76, height 5\' 4"  (1.626 m), weight 184 lb (83.5 kg).  General WDWN female NAD Vulva:  normal appearing vulva with no masses, tenderness or lesions Vagina:  normal mucosa, no discharge atrophic Cervix:  Normal no lesions Uterus:  normal size, contour, position, consistency, mobility, non-tender Adnexa: ovaries:present,  normal adnexa in size, nontender and no masses   Pertinent ROS No burning with urination, frequency or urgency No nausea, vomiting or diarrhea Nor fever chills or other constitutional symptoms   Labs or studies     Impression Diagnoses this Encounter::   ICD-10-CM   1. Acute vulvitis  N76.2   2. Hyperglycemia  R73.9 HgB A1c    Established relevant diagnosis(es):   Plan/Recommendations: Meds ordered this encounter  Medications  . terconazole (TERAZOL 7) 0.4 % vaginal cream    Sig: Place 1 applicator vaginally at bedtime.    Dispense:  45 g    Refill:  0    Labs or Scans Ordered: Orders Placed This Encounter  Procedures  . HgB A1c    Management:: Vaginal/Vulvar gentian violet Vaginal terazol for 1 weeks internally and externally Elevated glucose on her CMP, check A1C  Follow up No follow-ups on file.    All questions were answered.

## 2020-02-20 LAB — HEMOGLOBIN A1C
Est. average glucose Bld gHb Est-mCnc: 117 mg/dL
Hgb A1c MFr Bld: 5.7 % — ABNORMAL HIGH (ref 4.8–5.6)

## 2020-03-04 ENCOUNTER — Ambulatory Visit (INDEPENDENT_AMBULATORY_CARE_PROVIDER_SITE_OTHER): Payer: PPO | Admitting: Nurse Practitioner

## 2020-03-04 ENCOUNTER — Other Ambulatory Visit: Payer: Self-pay

## 2020-03-04 VITALS — BP 130/72 | HR 84 | Temp 97.6°F | Resp 18 | Wt 183.2 lb

## 2020-03-04 DIAGNOSIS — J00 Acute nasopharyngitis [common cold]: Secondary | ICD-10-CM | POA: Diagnosis not present

## 2020-03-04 MED ORDER — FLUTICASONE PROPIONATE 50 MCG/ACT NA SUSP: 2.0000 | Freq: Every day | NASAL | 6 refills | Status: AC
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
## 2020-03-04 NOTE — Patient Instructions (Signed)
Drink plenty of water Get plenty of rest May take over the counter medication for relief of your symptoms following directions Seek medical attention for new, worsening, or non resolving symptoms

## 2020-03-05 ENCOUNTER — Other Ambulatory Visit: Payer: Self-pay | Admitting: Family Medicine

## 2020-03-08 ENCOUNTER — Telehealth: Payer: Self-pay | Admitting: *Deleted

## 2020-03-08 MED ORDER — FLUCONAZOLE 150 MG PO TABS: ORAL_TABLET | ORAL | 0 refills | Status: DC

## 2020-03-08 MED ORDER — AMOXICILLIN 875 MG PO TABS: 875.0000 mg | ORAL_TABLET | Freq: Two times a day (BID) | ORAL | 0 refills | Status: AC

## 2020-03-08 NOTE — Telephone Encounter (Signed)
Amoxicillin 875mg  BID x 7 days if she cant tolerate, send zpak  Use nasal saline/ nasal steroid ( flonase etc otc) Allergy med oTC

## 2020-03-08 NOTE — Telephone Encounter (Signed)
Call placed to patient and patient made aware.   Patient agreeable to Amoxicillin. Requested prescription for diflucan.  Prescription sent to pharmacy.

## 2020-03-08 NOTE — Telephone Encounter (Signed)
Received call from patient.   Reports that she was seen by NP on 03/04/2020. Reports that she hs worsening nasal congestion and pressure on frontal sinuses.   Denies fever, chills, cough, sore throat.  Reports that she was advised if Sx worsen to call back and ABTx can be prescribed.   MD please advise.

## 2020-03-18 ENCOUNTER — Ambulatory Visit (INDEPENDENT_AMBULATORY_CARE_PROVIDER_SITE_OTHER): Payer: PPO | Admitting: Family Medicine

## 2020-03-18 ENCOUNTER — Other Ambulatory Visit: Payer: Self-pay

## 2020-03-18 ENCOUNTER — Encounter: Payer: Self-pay | Admitting: Family Medicine

## 2020-03-18 VITALS — BP 138/76 | HR 68 | Temp 98.0°F | Resp 14 | Ht 64.0 in | Wt 183.0 lb

## 2020-03-18 DIAGNOSIS — E038 Other specified hypothyroidism: Secondary | ICD-10-CM | POA: Diagnosis not present

## 2020-03-18 DIAGNOSIS — K219 Gastro-esophageal reflux disease without esophagitis: Secondary | ICD-10-CM | POA: Diagnosis not present

## 2020-03-18 DIAGNOSIS — E538 Deficiency of other specified B group vitamins: Secondary | ICD-10-CM | POA: Diagnosis not present

## 2020-03-18 DIAGNOSIS — I10 Essential (primary) hypertension: Secondary | ICD-10-CM

## 2020-03-18 DIAGNOSIS — E6609 Other obesity due to excess calories: Secondary | ICD-10-CM

## 2020-03-18 DIAGNOSIS — E782 Mixed hyperlipidemia: Secondary | ICD-10-CM

## 2020-03-18 DIAGNOSIS — R7303 Prediabetes: Secondary | ICD-10-CM

## 2020-03-18 DIAGNOSIS — Z6831 Body mass index (BMI) 31.0-31.9, adult: Secondary | ICD-10-CM

## 2020-03-18 MED ORDER — TRAZODONE HCL 50 MG PO TABS: ORAL_TABLET | ORAL | 1 refills | Status: DC

## 2020-03-18 NOTE — Assessment & Plan Note (Signed)
Recheck level see if holding steady with oral supplement

## 2020-03-18 NOTE — Patient Instructions (Addendum)
For nasal spray, use 1 spray twice a day  F/U 4 months fasting

## 2020-03-18 NOTE — Assessment & Plan Note (Signed)
Now on daily PPI, f/u GI, history of esophagitis as well

## 2020-03-18 NOTE — Assessment & Plan Note (Signed)
Check labs, continue replacement Discussed proper administration of thyroid medication

## 2020-03-18 NOTE — Assessment & Plan Note (Signed)
Controlled no changes, check renal function, CBC

## 2020-03-18 NOTE — Assessment & Plan Note (Signed)
Discussed reducing sugary beverages, snacks in diet, to prevent diabetes

## 2020-03-18 NOTE — Progress Notes (Signed)
   Subjective:    Patient ID: Jill Roach, female    DOB: 09/26/1950, 69 y.o.   MRN: [id number]  Patient presents for Follow-up (is not fasting)  Pt here to f/u chronic medical problems Medications reviewed   Hypothyrodism- taking levothyroxine 59mcg once a day   HTN- taking BP meds as prscribed  GERD- protonix take once a day, has appt at the end of the month with GI   Chronic insomnia- trazodone   A1C done with her GYN found to be borderline diabetic with A1C 5.7%  B12 def- she has completed shots, now on oral B12, de fo recheck    Hyperlipidemia  - TG increased with lipids in April at 186 She has started to cut down on ice-cream   She has allergies using flonase once a day     Review Of Systems:  GEN- denies fatigue, fever, weight loss,weakness, recent illness HEENT- denies eye drainage, change in vision, nasal discharge, CVS- denies chest pain, palpitations RESP- denies SOB, cough, wheeze ABD- denies N/V, change in stools, abd pain GU- denies dysuria, hematuria, dribbling, incontinence MSK- denies joint pain, muscle aches, injury Neuro- denies headache, dizziness, syncope, seizure activity       Objective:    BP 138/76   Pulse 68   Temp 98 F (36.7 C) (Temporal)   Resp 14   Ht 5\' 4"  (1.626 m)   Wt 183 lb (83 kg)   SpO2 97%   BMI 31.41 kg/m  GEN- NAD, alert and oriented x3 HEENT- PERRL, EOMI, non injected sclera, pink conjunctiva, MMM, oropharynx clear Neck- Supple, no thyromegaly CVS- RRR, no murmur RESP-CTAB ABD-NABS,soft,NT,ND EXT- No edema Pulses- Radial, DP- 2+        Assessment & Plan:      Problem List Items Addressed This Visit      Unprioritized   B12 deficiency    Recheck level see if holding steady with oral supplement       Relevant Orders   Vitamin B12   GERD (gastroesophageal reflux disease)    Now on daily PPI, f/u GI, history of esophagitis as well       Hyperlipidemia   Hypertension - Primary    Controlled no  changes, check renal function, CBC      Relevant Orders   CBC with Differential/Platelet   Comprehensive metabolic panel   Hypothyroidism    Check labs, continue replacement Discussed proper administration of thyroid medication      Relevant Orders   TSH   T3, free   Obesity    Discussed reducing sugary beverages, snacks in diet, to prevent diabetes        Other Visit Diagnoses    Borderline diabetes          Note: This dictation was prepared with Dragon dictation along with smaller phrase technology. Any transcriptional errors that result from this process are unintentional.

## 2020-03-19 LAB — COMPREHENSIVE METABOLIC PANEL
AG Ratio: 1.9 (calc) (ref 1.0–2.5)
ALT: 21 U/L (ref 6–29)
AST: 17 U/L (ref 10–35)
Albumin: 4.6 g/dL (ref 3.6–5.1)
Alkaline phosphatase (APISO): 69 U/L (ref 37–153)
BUN: 14 mg/dL (ref 7–25)
CO2: 28 mmol/L (ref 20–32)
Calcium: 9.7 mg/dL (ref 8.6–10.4)
Chloride: 103 mmol/L (ref 98–110)
Creat: 0.85 mg/dL (ref 0.50–0.99)
Globulin: 2.4 g/dL (calc) (ref 1.9–3.7)
Glucose, Bld: 113 mg/dL — ABNORMAL HIGH (ref 65–99)
Potassium: 4.1 mmol/L (ref 3.5–5.3)
Sodium: 139 mmol/L (ref 135–146)
Total Bilirubin: 0.5 mg/dL (ref 0.2–1.2)
Total Protein: 7 g/dL (ref 6.1–8.1)

## 2020-03-19 LAB — CBC WITH DIFFERENTIAL/PLATELET
Absolute Monocytes: 549 cells/uL (ref 200–950)
Basophils Absolute: 50 cells/uL (ref 0–200)
Basophils Relative: 0.9 %
Eosinophils Absolute: 50 cells/uL (ref 15–500)
Eosinophils Relative: 0.9 %
HCT: 39.3 % (ref 35.0–45.0)
Hemoglobin: 13.5 g/dL (ref 11.7–15.5)
Lymphs Abs: 1540 cells/uL (ref 850–3900)
MCH: 29.5 pg (ref 27.0–33.0)
MCHC: 34.4 g/dL (ref 32.0–36.0)
MCV: 86 fL (ref 80.0–100.0)
MPV: 10.4 fL (ref 7.5–12.5)
Monocytes Relative: 9.8 %
Neutro Abs: 3410 cells/uL (ref 1500–7800)
Neutrophils Relative %: 60.9 %
Platelets: 253 10*3/uL (ref 140–400)
RBC: 4.57 10*6/uL (ref 3.80–5.10)
RDW: 13.6 % (ref 11.0–15.0)
Total Lymphocyte: 27.5 %
WBC: 5.6 10*3/uL (ref 3.8–10.8)

## 2020-03-19 LAB — VITAMIN B12: Vitamin B-12: 430 pg/mL (ref 200–1100)

## 2020-03-19 LAB — T3, FREE: T3, Free: 3.2 pg/mL (ref 2.3–4.2)

## 2020-03-19 LAB — TSH: TSH: 0.49 mIU/L (ref 0.40–4.50)

## 2020-03-20 ENCOUNTER — Encounter: Payer: Self-pay | Admitting: *Deleted

## 2020-04-02 NOTE — Progress Notes (Signed)
Referring Provider: Alycia Rossetti, MD Primary Care Physician:  Alycia Rossetti, MD Primary GI Physician: Dr. Gala Romney  Chief Complaint  Patient presents with  . Gastroesophageal Reflux    doing ok; taking Protonix once instead of twice-helps    HPI:   Jill Roach is a 69 y.o. female presenting today for follow-up. History of GERD, globus sensation, and constipation. EGD in 2018 with LA grade B esophagitis s/p dilation, normal stomach and duodenum. BPE 2018 with moderate to marked esophageal dysmotility. Manometry and ambulatory esophageal 24 hour pH monitoring in 2004 negative.  ENT evaluation in 2019 for globus sensation was related to LPR.  Colonoscopy in 2018 with diverticulosis, otherwise normal. Repeat in 2028.   Last seen in out office 10/02/19. GERD symptoms were well controlled on Protonix daily. She had COVID in December 2020 and noted return of intermittent globus sensation and mild intermittent constipation. Plan included Benefiber or Metamucil daily, MiraLAX as needed, increase Protonix to 40 mg twice daily due to return of globus sensation, follow-up in 6 months.  Today:   GERD: Well controlled on Protonix daily. Took BID dosing for about one month and returned to once daily. No abdominal pain. No nausea or vomiting.  No dysphagia.   Globus sensation: Resolved.   Constipation: Improved. Took MiraLAX for about 2 weeks and symptoms resolved.  Soft, formed, complete BMs daily. No blood in the stool. No black stool.   Has been taking Aleve recently due to straining her back. Doesn't usually take this on a daily bases.   Past Medical History:  Diagnosis Date  . Allergy   . Cataract   . GERD (gastroesophageal reflux disease)   . Hyperlipidemia   . Hypertension   . PONV (postoperative nausea and vomiting)   . Thyroid disease     Past Surgical History:  Procedure Laterality Date  . ambulatory esophageal 24 hour pH study  2004   within normal range. Dr. Johnathan Hausen  . COLONOSCOPY N/A 11/04/2016   Procedure: COLONOSCOPY;  Surgeon: Daneil Dolin, MD; diverticulosis in the sigmoid and descending colon, otherwise normal.  Repeat in 2028.  Marland Kitchen ESOPHAGEAL MANOMETRY  2004   Dr. Johnathan Hausen: normal  . ESOPHAGOGASTRODUODENOSCOPY  2003   Dr. Johnathan Hausen: normal   . ESOPHAGOGASTRODUODENOSCOPY N/A 11/04/2016   Procedure: ESOPHAGOGASTRODUODENOSCOPY (EGD);  Surgeon: Daneil Dolin, MD; LA grade B esophagitis s/p dilation, normal stomach and duodenum.  Marland Kitchen EYE SURGERY     cataracts  . FRACTURE SURGERY     cataract surgery   . LIGAMENT REPAIR     rt knee  . MALONEY DILATION N/A 11/04/2016   Procedure: Venia Minks DILATION;  Surgeon: Daneil Dolin, MD;  Location: [facility];  Service: Endoscopy;  Laterality: N/A;  . TUBAL LIGATION      Current Outpatient Medications  Medication Sig Dispense Refill  . Ascorbic Acid (VITAMIN C) 100 MG tablet Take 100 mg by mouth daily.    . Calcium-Magnesium-Vitamin D (CALCIUM 1200+D3 PO) Take 1 tablet by mouth daily.    [name] 88 MCG tablet Take 1 tablet by mouth once daily 90 tablet 0  . fluticasone (FLONASE) 50 MCG/ACT nasal spray Place 2 sprays into both nostrils daily. 16 g 6  . levocetirizine (XYZAL) 5 MG tablet TAKE 1 TABLET BY MOUTH ONCE DAILY IN THE EVENING 90 tablet 0  . lisinopril-hydrochlorothiazide (ZESTORETIC) 20-12.5 MG tablet Take 1 tablet by mouth once daily 90 tablet 1  . montelukast (SINGULAIR)  10 MG tablet TAKE 1 TABLET BY MOUTH AT BEDTIME 90 tablet 0  . pantoprazole (PROTONIX) 40 MG tablet Take 1 tablet (40 mg total) by mouth 2 (two) times daily before a meal. (Patient taking differently: Take 40 mg by mouth daily. ) 60 tablet 5  . traZODone (DESYREL) 50 MG tablet TAKE 1/2 TO 1 (ONE-HALF TO ONE) TABLET BY MOUTH ONCE DAILY AT BEDTIME AS NEEDED FOR SLEEP 90 tablet 1  . vitamin B-12 (CYANOCOBALAMIN) 500 MCG tablet Take 1,000 mcg by mouth in the morning and at bedtime.     No current  facility-administered medications for this visit.    Allergies as of 04/03/2020 - Review Complete 04/03/2020  Allergen Reaction Noted  . Codeine Nausea Only 09/20/2015    Family History  Problem Relation Age of Onset  . Hypertension Mother   . Arthritis Father        Rheumatoid   . Diabetes Father   . Hypertension Father   . Thyroid disease Father   . Arthritis Brother        Rheumatoid  . Thyroid disease Brother   . Heart disease Maternal Grandmother        stomach cancer  . Hypertension Maternal Grandmother   . Cancer Paternal Grandmother        LEUKEMIA/LUNG CNACER,  . Thyroid disease Paternal Grandmother   . Hypertension Paternal Grandmother   . Cancer Other   . Mental illness Other   . Cancer Maternal Uncle   . Thyroid disease Paternal Aunt   . Colon cancer Neg Hx     Social History   Socioeconomic History  . Marital status: Divorced    Spouse name: Not on file  . Number of children: Not on file  . Years of education: Not on file  . Highest education level: Not on file  Occupational History  . Not on file  Tobacco Use  . Smoking status: Never Smoker  . Smokeless tobacco: Never Used  Vaping Use  . Vaping Use: Never used  Substance and Sexual Activity  . Alcohol use: No  . Drug use: No  . Sexual activity: Not Currently  Other Topics Concern  . Not on file  Social History Narrative  . Not on file   Social Determinants of Health   Financial Resource Strain:   . Difficulty of Paying Living Expenses: Not on file  Food Insecurity:   . Worried About Charity fundraiser in the Last Year: Not on file  . Ran Out of Food in the Last Year: Not on file  Transportation Needs:   . Lack of Transportation (Medical): Not on file  . Lack of Transportation (Non-Medical): Not on file  Physical Activity:   . Days of Exercise per Week: Not on file  . Minutes of Exercise per Session: Not on file  Stress:   . Feeling of Stress : Not on file  Social Connections:   .  Frequency of Communication with Friends and Family: Not on file  . Frequency of Social Gatherings with Friends and Family: Not on file  . Attends Religious Services: Not on file  . Active Member of Clubs or Organizations: Not on file  . Attends Archivist Meetings: Not on file  . Marital Status: Not on file    Review of Systems: Gen: Denies fever, chills, cold or flu like symptoms, lightheadedness, dizziness, pre-syncope, or syncope.  CV: Denies chest pain or palpitations. Resp: Denies dyspnea or cough.  GI: See  HPI Heme: See HPI  Physical Exam: BP (!) 153/85   Pulse 75   Temp (!) 97.3 F (36.3 C) (Temporal)   Ht 5\' 4"  (1.626 m)   Wt 183 lb (83 kg)   BMI 31.41 kg/m  General:   Alert and oriented. No distress noted. Pleasant and cooperative.  Head:  Normocephalic and atraumatic.  Eyes:  Conjuctiva clear without scleral icterus. Heart:  S1, S2 present without murmurs appreciated. Lungs:  Clear to auscultation bilaterally. No wheezes, rales, or rhonchi. No distress.  Abdomen:  +BS, soft, non-tender and non-distended. No rebound or guarding. No HSM or masses noted. Msk:  Symmetrical without gross deformities. Normal posture. Extremities:  Without edema. Neurologic:  Alert and  oriented x4 Psych: Normal mood and affect.

## 2020-04-03 ENCOUNTER — Other Ambulatory Visit: Payer: Self-pay | Admitting: Orthopedic Surgery

## 2020-04-03 ENCOUNTER — Telehealth: Payer: Self-pay | Admitting: Orthopedic Surgery

## 2020-04-03 ENCOUNTER — Encounter: Payer: Self-pay | Admitting: Gastroenterology

## 2020-04-03 ENCOUNTER — Other Ambulatory Visit: Payer: Self-pay

## 2020-04-03 ENCOUNTER — Other Ambulatory Visit: Payer: Self-pay | Admitting: Family Medicine

## 2020-04-03 ENCOUNTER — Ambulatory Visit: Payer: PPO | Admitting: Gastroenterology

## 2020-04-03 VITALS — BP 153/85 | HR 75 | Temp 97.3°F | Ht 64.0 in | Wt 183.0 lb

## 2020-04-03 DIAGNOSIS — R0989 Other specified symptoms and signs involving the circulatory and respiratory systems: Secondary | ICD-10-CM

## 2020-04-03 DIAGNOSIS — K5900 Constipation, unspecified: Secondary | ICD-10-CM | POA: Diagnosis not present

## 2020-04-03 DIAGNOSIS — R198 Other specified symptoms and signs involving the digestive system and abdomen: Secondary | ICD-10-CM

## 2020-04-03 DIAGNOSIS — K219 Gastro-esophageal reflux disease without esophagitis: Secondary | ICD-10-CM | POA: Diagnosis not present

## 2020-04-03 DIAGNOSIS — R09A2 Foreign body sensation, throat: Secondary | ICD-10-CM

## 2020-04-03 MED ORDER — PREDNISONE 10 MG (48) PO TBPK: ORAL_TABLET | Freq: Every day | ORAL | 0 refills | Status: DC

## 2020-04-03 NOTE — Progress Notes (Signed)
Cc'ed to pcp °

## 2020-04-03 NOTE — Telephone Encounter (Signed)
Spoke with patient; aware. 

## 2020-04-03 NOTE — Telephone Encounter (Signed)
Patient called to relay that she has had a flare up of her low back pain due having lifted some heavy boxes a few days ago.  Patient is asking if she is to proceed with an instruction per Dr Aline Brochure at time of last visit on 05/19/19: "this point she is asymptomatic I would place an order for prednisone Dosepak with a couple refills if she gets another exacerbation."  If that does not do the trick then she can call us for reevaluation".  I offered appointment, however, relayed we do not have an immediate opening.  Patient said she would like to try the Prednisone if recommended, however, "does not know to take it".  States uses [employer] [employer].   Please advise.

## 2020-04-03 NOTE — Patient Instructions (Signed)
Continue taking Protonix 40 mg daily 30 minutes before breakfast.  Follow a GERD diet:  Avoid fried, fatty, greasy, spicy, citrus foods. Avoid caffeine and carbonated beverages. Avoid chocolate. Try eating 4-6 small meals a day rather than 3 large meals. Do not eat within 3 hours of laying down. Prop head of bed up on wood or bricks to create a 6 inch incline.  Continue drinking plenty of water daily. Eat plenty of fruits and vegetables to maintain adequate fiber intake. You may consider adding a daily fiber supplement such as Benefiber or Metamucil to prevent constipation. You may use MiraLAX 17 g in 8 ounces of water as needed for constipation.  We will plan to see you back in 6 months.  Do not hesitate to call with questions or concerns prior.  Aliene Altes, PA-C [facility]

## 2020-04-03 NOTE — Assessment & Plan Note (Addendum)
Resolved after increasing Protonix to 40 mg BID x 1 month. She has returned to 40 mg daily and continues to do well. GERD is well controlled. No dysphagia. Advised she continue her Protonix 40 mg daily and follow-up in 6 months. Call if any return of symptoms prior to next visit.

## 2020-04-03 NOTE — Assessment & Plan Note (Addendum)
Well controlled on Protonix 40 mg daily. No alarm symptoms. Advised she continue her current medications and follow-up in 6 months. Counseled on GERD diet/lifestyle.

## 2020-04-03 NOTE — Telephone Encounter (Signed)
Medication sent in with instructions

## 2020-04-03 NOTE — Assessment & Plan Note (Addendum)
Resolved. Used MiraLAX daily x2 weeks. No longer taking anything to help with bowel regularity and is having soft, formed, complete BMs daily without alarm symptoms.   Plan:  Continue drinking plenty of water daily. Eat plenty of fruits and vegetables to maintain adequate fiber intake. Consider adding a daily fiber supplement such as Benefiber or Metamucil to prevent constipation. MiraLAX 17 g in 8 ounces of water as needed for breakthrough constipation. Follow-up in 6 months.

## 2020-04-17 ENCOUNTER — Telehealth: Payer: Self-pay | Admitting: Obstetrics & Gynecology

## 2020-04-17 MED ORDER — TERCONAZOLE 0.4 % VA CREA
1.0000 | TOPICAL_CREAM | Freq: Every day | VAGINAL | 0 refills | Status: DC
Start: 2020-04-17 — End: 2020-05-28

## 2020-04-17 NOTE — Telephone Encounter (Signed)
Patient was seen in July for some irritation that seemed to resolve with medicine that dr Elonda Husky prescribed, but now is reoccuring wants to know if something can be sent to pharmacy or if she should come in for eval.

## 2020-04-17 NOTE — Telephone Encounter (Signed)
Telephoned patient at home number and left message.  

## 2020-04-17 NOTE — Telephone Encounter (Signed)
Telephoned patient at home number and advised patient medication was called into pharmacy.

## 2020-04-17 NOTE — Telephone Encounter (Signed)
Pt spoke to someone this mourning she said and got cream for yeast infection but wants to talk to her again about itching around her rectum

## 2020-04-23 ENCOUNTER — Telehealth: Payer: Self-pay | Admitting: Obstetrics & Gynecology

## 2020-04-23 NOTE — Telephone Encounter (Signed)
Pt seen Dr Elonda Husky for yeast/ and burning in bottom/ no discharge / and she is still having the issue after taking meds. Please advise

## 2020-04-23 NOTE — Telephone Encounter (Signed)
Pt reports that she is still having problems. Last time she came in the purple medicine helped. Advised patient we can get her scheduled to see Dr. Elonda Husky again to have reevaluation.

## 2020-04-26 ENCOUNTER — Other Ambulatory Visit: Payer: Self-pay

## 2020-04-26 ENCOUNTER — Ambulatory Visit (INDEPENDENT_AMBULATORY_CARE_PROVIDER_SITE_OTHER): Payer: PPO | Admitting: Obstetrics & Gynecology

## 2020-04-26 ENCOUNTER — Encounter: Payer: Self-pay | Admitting: Obstetrics & Gynecology

## 2020-04-26 VITALS — BP 134/76 | HR 82 | Ht 64.0 in | Wt 185.0 lb

## 2020-04-26 DIAGNOSIS — B373 Candidiasis of vulva and vagina: Secondary | ICD-10-CM | POA: Diagnosis not present

## 2020-04-26 DIAGNOSIS — B3731 Acute candidiasis of vulva and vagina: Secondary | ICD-10-CM

## 2020-04-26 MED ORDER — FLUCONAZOLE 100 MG PO TABS
100.0000 mg | ORAL_TABLET | Freq: Every day | ORAL | 0 refills | Status: DC
Start: 2020-04-26 — End: 2020-05-28

## 2020-04-26 NOTE — Progress Notes (Signed)
       Chief Complaint  Patient presents with  . Vaginitis    wants Gentian Violet    Blood pressure 134/76, pulse 82, height 5\' 4"  (1.626 m), weight 185 lb (83.9 kg).  69 y.o. G3P3003 No LMP recorded. Patient is postmenopausal. The current method of family planning is post menopausal status.  Subjective  Itching yes Irritation yes Odor no Similar to previous yes  Previous treatment terazol  Objective Vulva:  normal appearing vulva with no masses, tenderness or lesions, vulvar erythema consistent with yeast Vagina:  normal mucosa, curd-like discharge Cervix:   Uterus:   Adnexa: ovaries:,       Pertinent ROS No burning with urination, frequency or urgency No nausea, vomiting or diarrhea Nor fever chills or other constitutional symptoms   Labs or studies     Impression Diagnoses this Encounter::   ICD-10-CM   1. Candidal vulvovaginitis  B37.3     Established relevant diagnosis(es):   Plan/Recommendations: Meds ordered this encounter  Medications  . fluconazole (DIFLUCAN) 100 MG tablet    Sig: Take 1 tablet (100 mg total) by mouth daily.    Dispense:  7 tablet    Refill:  0    Labs or Scans Ordered: No orders of the defined types were placed in this encounter.   Management:: Gentian violet today Diflucan if needed  Follow up Return if symptoms worsen or fail to improve.      All questions were answered.

## 2020-04-29 ENCOUNTER — Ambulatory Visit (INDEPENDENT_AMBULATORY_CARE_PROVIDER_SITE_OTHER): Payer: PPO | Admitting: Orthopedic Surgery

## 2020-04-29 ENCOUNTER — Other Ambulatory Visit: Payer: Self-pay

## 2020-04-29 ENCOUNTER — Encounter: Payer: Self-pay | Admitting: Orthopedic Surgery

## 2020-04-29 VITALS — BP 132/74 | HR 82 | Ht 64.0 in | Wt 184.0 lb

## 2020-04-29 DIAGNOSIS — M545 Low back pain, unspecified: Secondary | ICD-10-CM

## 2020-04-29 DIAGNOSIS — G8929 Other chronic pain: Secondary | ICD-10-CM

## 2020-04-29 MED ORDER — METHOCARBAMOL 500 MG PO TABS: 500.0000 mg | ORAL_TABLET | Freq: Three times a day (TID) | ORAL | 1 refills | Status: DC

## 2020-04-29 MED ORDER — GABAPENTIN 100 MG PO CAPS: 100.0000 mg | ORAL_CAPSULE | Freq: Three times a day (TID) | ORAL | 2 refills | Status: DC

## 2020-04-29 NOTE — Progress Notes (Signed)
Chief Complaint  Patient presents with  . Follow-up    Recheck on back pain.    69 year old female that we saw last year comes in after calling us last week for a prednisone prescription for recurrent lower back pain which is radiating to the left side but not into the legs she denies any numbness tingling weakness bowel or bladder dysfunction. We last saw her October 2020 she had been previously treated with meloxicam and tizanidine for spondylosis and degenerative scoliosis.  She says she was lifting some heavy boxes and aggravated the back again again without leg pain  Past Medical History:  Diagnosis Date  . Allergy   . Cataract   . GERD (gastroesophageal reflux disease)   . Hyperlipidemia   . Hypertension   . PONV (postoperative nausea and vomiting)   . Thyroid disease    BP 132/74   Pulse 82   Ht 5\' 4"  (1.626 m)   Wt 184 lb (83.5 kg)   BMI 31.58 kg/m   Mainly pain in the lower back leg function is normal  We discussed possible treatment options she does not have the money to really go to therapy so we pulled some exercises and I edited them for her to do at home and also decided to put her on Robaxin and gabapentin with a as needed follow-up with precautions  Encounter Diagnosis  Name Primary?  . Chronic midline low back pain without sciatica Yes   Mild exacerbation seems to have improved with prednisone  Continue with Robaxin and gabapentin due to the patient's reflux and history of GI disease and currently on Protonix  Meds ordered this encounter  Medications  . methocarbamol (ROBAXIN) 500 MG tablet    Sig: Take 1 tablet (500 mg total) by mouth 3 (three) times daily.    Dispense:  60 tablet    Refill:  1  . gabapentin (NEURONTIN) 100 MG capsule    Sig: Take 1 capsule (100 mg total) by mouth 3 (three) times daily.    Dispense:  90 capsule    Refill:  2

## 2020-04-29 NOTE — Patient Instructions (Signed)
Heat pad   Heat to the back   Exercises at home   New meds : Gabapentin and Robaxin

## 2020-05-01 ENCOUNTER — Other Ambulatory Visit: Payer: Self-pay | Admitting: Family Medicine

## 2020-05-01 DIAGNOSIS — J309 Allergic rhinitis, unspecified: Secondary | ICD-10-CM

## 2020-05-07 ENCOUNTER — Other Ambulatory Visit: Payer: Self-pay | Admitting: Family Medicine

## 2020-05-07 DIAGNOSIS — J309 Allergic rhinitis, unspecified: Secondary | ICD-10-CM

## 2020-05-14 ENCOUNTER — Telehealth: Payer: Self-pay | Admitting: Orthopedic Surgery

## 2020-05-14 ENCOUNTER — Telehealth: Payer: Self-pay | Admitting: Obstetrics & Gynecology

## 2020-05-14 NOTE — Telephone Encounter (Signed)
Patient states she still has some slight symptoms of yeast infection states she thinks she needs to 'be painted with medicine again' patient scheduled for tomorrow at 93

## 2020-05-14 NOTE — Telephone Encounter (Signed)
The gabapentin and methocarbamol should not raise blood sugar prednisone will that's why the prednisone is shorter course  I called her to advise She will continue the meds until they are gone, and let us know if she needs refill or needs to come back in.

## 2020-05-14 NOTE — Telephone Encounter (Signed)
Patient states that she is taking the Prednisone, Gabapentin and Neurontin and has just started feeling better.  She says that she was told that the Prednisone would cause her blood sugar levels to be elevated.  She wants to know if the Gabapentin or the Neurontin would do that also.  She said she asked the [facility] pharmacist but he said he didn't so.  Does she need to finish all the medications plus the refills or just finish the current medications that she is now?  Please advise  Thanks

## 2020-05-15 ENCOUNTER — Encounter: Payer: Self-pay | Admitting: Family Medicine

## 2020-05-15 ENCOUNTER — Other Ambulatory Visit: Payer: Self-pay

## 2020-05-15 ENCOUNTER — Encounter: Payer: Self-pay | Admitting: Obstetrics & Gynecology

## 2020-05-15 ENCOUNTER — Ambulatory Visit (INDEPENDENT_AMBULATORY_CARE_PROVIDER_SITE_OTHER): Payer: PPO | Admitting: Obstetrics & Gynecology

## 2020-05-15 VITALS — BP 137/79 | HR 84 | Ht 64.0 in | Wt 186.0 lb

## 2020-05-15 DIAGNOSIS — B373 Candidiasis of vulva and vagina: Secondary | ICD-10-CM | POA: Diagnosis not present

## 2020-05-15 DIAGNOSIS — B3731 Acute candidiasis of vulva and vagina: Secondary | ICD-10-CM

## 2020-05-15 NOTE — Progress Notes (Signed)
Chief Complaint  Patient presents with  . want be painted gential violet      69 y.o. G3P3003 No LMP recorded. Patient is postmenopausal. The current method of family planning is .  Outpatient Encounter Medications as of 05/15/2020  Medication Sig  . Ascorbic Acid (VITAMIN C) 100 MG tablet Take 100 mg by mouth daily.  . Calcium-Magnesium-Vitamin D (CALCIUM 1200+D3 PO) Take 1 tablet by mouth daily.  [name] 88 MCG tablet Take 1 tablet by mouth once daily  . fluconazole (DIFLUCAN) 100 MG tablet Take 1 tablet (100 mg total) by mouth daily.  . fluticasone (FLONASE) 50 MCG/ACT nasal spray Place 2 sprays into both nostrils daily.  Marland Kitchen gabapentin (NEURONTIN) 100 MG capsule Take 1 capsule (100 mg total) by mouth 3 (three) times daily.  Marland Kitchen levocetirizine (XYZAL) 5 MG tablet TAKE 1 TABLET BY MOUTH ONCE DAILY IN THE EVENING  . lisinopril-hydrochlorothiazide (ZESTORETIC) 20-12.5 MG tablet Take 1 tablet by mouth once daily  . methocarbamol (ROBAXIN) 500 MG tablet Take 1 tablet (500 mg total) by mouth 3 (three) times daily.  . montelukast (SINGULAIR) 10 MG tablet TAKE 1 TABLET BY MOUTH AT BEDTIME  . pantoprazole (PROTONIX) 40 MG tablet Take 1 tablet (40 mg total) by mouth 2 (two) times daily before a meal. (Patient taking differently: Take 40 mg by mouth daily. )  . predniSONE (STERAPRED UNI-PAK 48 TAB) 10 MG (48) TBPK tablet Take by mouth daily. 10 mg 12 days  . terconazole (TERAZOL 7) 0.4 % vaginal cream Place 1 applicator vaginally at bedtime.  . traZODone (DESYREL) 50 MG tablet TAKE 1/2 TO 1 (ONE-HALF TO ONE) TABLET BY MOUTH ONCE DAILY AT BEDTIME AS NEEDED FOR SLEEP  . vitamin B-12 (CYANOCOBALAMIN) 500 MCG tablet Take 1,000 mcg by mouth in the morning and at bedtime.   No facility-administered encounter medications on file as of 05/15/2020.    Subjective Pt with still a little irritation vulvovaginally from the yeast Finished the terazol and was painted wants to be painted again Past  Medical History:  Diagnosis Date  . Allergy   . Cataract   . GERD (gastroesophageal reflux disease)   . Hyperlipidemia   . Hypertension   . PONV (postoperative nausea and vomiting)   . Thyroid disease     Past Surgical History:  Procedure Laterality Date  . ambulatory esophageal 24 hour pH study  2004   within normal range. Dr. Johnathan Hausen  . COLONOSCOPY N/A 11/04/2016   Procedure: COLONOSCOPY;  Surgeon: Daneil Dolin, MD; diverticulosis in the sigmoid and descending colon, otherwise normal.  Repeat in 2028.  Marland Kitchen ESOPHAGEAL MANOMETRY  2004   Dr. Johnathan Hausen: normal  . ESOPHAGOGASTRODUODENOSCOPY  2003   Dr. Johnathan Hausen: normal   . ESOPHAGOGASTRODUODENOSCOPY N/A 11/04/2016   Procedure: ESOPHAGOGASTRODUODENOSCOPY (EGD);  Surgeon: Daneil Dolin, MD; LA grade B esophagitis s/p dilation, normal stomach and duodenum.  Marland Kitchen EYE SURGERY     cataracts  . FRACTURE SURGERY     cataract surgery   . LIGAMENT REPAIR     rt knee  . MALONEY DILATION N/A 11/04/2016   Procedure: Venia Minks DILATION;  Surgeon: Daneil Dolin, MD;  Location: [facility];  Service: Endoscopy;  Laterality: N/A;  . TUBAL LIGATION      OB History    Gravida  3   Para  3   Term  3   Preterm      AB      Living  3     SAB      TAB      Ectopic      Multiple      Live Births  3           Allergies  Allergen Reactions  . Codeine Nausea Only    Social History   Socioeconomic History  . Marital status: Divorced    Spouse name: Not on file  . Number of children: Not on file  . Years of education: Not on file  . Highest education level: Not on file  Occupational History  . Not on file  Tobacco Use  . Smoking status: Never Smoker  . Smokeless tobacco: Never Used  Vaping Use  . Vaping Use: Never used  Substance and Sexual Activity  . Alcohol use: No  . Drug use: No  . Sexual activity: Not Currently  Other Topics Concern  . Not on file  Social History Narrative  . Not on file     Social Determinants of Health   Financial Resource Strain:   . Difficulty of Paying Living Expenses: Not on file  Food Insecurity:   . Worried About Charity fundraiser in the Last Year: Not on file  . Ran Out of Food in the Last Year: Not on file  Transportation Needs:   . Lack of Transportation (Medical): Not on file  . Lack of Transportation (Non-Medical): Not on file  Physical Activity:   . Days of Exercise per Week: Not on file  . Minutes of Exercise per Session: Not on file  Stress:   . Feeling of Stress : Not on file  Social Connections:   . Frequency of Communication with Friends and Family: Not on file  . Frequency of Social Gatherings with Friends and Family: Not on file  . Attends Religious Services: Not on file  . Active Member of Clubs or Organizations: Not on file  . Attends Archivist Meetings: Not on file  . Marital Status: Not on file    Family History  Problem Relation Age of Onset  . Hypertension Mother   . Arthritis Father        Rheumatoid   . Diabetes Father   . Hypertension Father   . Thyroid disease Father   . Arthritis Brother        Rheumatoid  . Thyroid disease Brother   . Heart disease Maternal Grandmother        stomach cancer  . Hypertension Maternal Grandmother   . Cancer Paternal Grandmother        LEUKEMIA/LUNG CNACER,  . Thyroid disease Paternal Grandmother   . Hypertension Paternal Grandmother   . Cancer Other   . Mental illness Other   . Cancer Maternal Uncle   . Thyroid disease Paternal Aunt   . Colon cancer Neg Hx     Medications:       Current Outpatient Medications:  .  Ascorbic Acid (VITAMIN C) 100 MG tablet, Take 100 mg by mouth daily., Disp: , Rfl:  .  Calcium-Magnesium-Vitamin D (CALCIUM 1200+D3 PO), Take 1 tablet by mouth daily., Disp: , Rfl:  .  EUTHYROX 88 MCG tablet, Take 1 tablet by mouth once daily, Disp: 90 tablet, Rfl: 0 .  fluconazole (DIFLUCAN) 100 MG tablet, Take 1 tablet (100 mg total) by mouth  daily., Disp: 7 tablet, Rfl: 0 .  fluticasone (FLONASE) 50 MCG/ACT nasal spray, Place 2 sprays into both nostrils daily., Disp: 16 g, Rfl: 6 .  gabapentin (NEURONTIN) 100 MG capsule, Take 1 capsule (100 mg total) by mouth 3 (three) times daily., Disp: 90 capsule, Rfl: 2 .  levocetirizine (XYZAL) 5 MG tablet, TAKE 1 TABLET BY MOUTH ONCE DAILY IN THE EVENING, Disp: 90 tablet, Rfl: 0 .  lisinopril-hydrochlorothiazide (ZESTORETIC) 20-12.5 MG tablet, Take 1 tablet by mouth once daily, Disp: 90 tablet, Rfl: 0 .  methocarbamol (ROBAXIN) 500 MG tablet, Take 1 tablet (500 mg total) by mouth 3 (three) times daily., Disp: 60 tablet, Rfl: 1 .  montelukast (SINGULAIR) 10 MG tablet, TAKE 1 TABLET BY MOUTH AT BEDTIME, Disp: 90 tablet, Rfl: 0 .  pantoprazole (PROTONIX) 40 MG tablet, Take 1 tablet (40 mg total) by mouth 2 (two) times daily before a meal. (Patient taking differently: Take 40 mg by mouth daily. ), Disp: 60 tablet, Rfl: 5 .  predniSONE (STERAPRED UNI-PAK 48 TAB) 10 MG (48) TBPK tablet, Take by mouth daily. 10 mg 12 days, Disp: 48 tablet, Rfl: 0 .  terconazole (TERAZOL 7) 0.4 % vaginal cream, Place 1 applicator vaginally at bedtime., Disp: 45 g, Rfl: 0 .  traZODone (DESYREL) 50 MG tablet, TAKE 1/2 TO 1 (ONE-HALF TO ONE) TABLET BY MOUTH ONCE DAILY AT BEDTIME AS NEEDED FOR SLEEP, Disp: 90 tablet, Rfl: 1 .  vitamin B-12 (CYANOCOBALAMIN) 500 MCG tablet, Take 1,000 mcg by mouth in the morning and at bedtime., Disp: , Rfl:   Objective Blood pressure 137/79, pulse 84, height 5\' 4"  (1.626 m), weight 186 lb (84.4 kg).  Mild erythema No discharge noted  Pertinent ROS No burning with urination, frequency or urgency No nausea, vomiting or diarrhea Nor fever chills or other constitutional symptoms   Labs or studies     Impression Diagnoses this Encounter::   ICD-10-CM   1. Candidal vulvovaginitis  B37.3     Established relevant diagnosis(es):   Plan/Recommendations: No orders of the defined  types were placed in this encounter.   Labs or Scans Ordered: No orders of the defined types were placed in this encounter.   Management:: GV done vaginally vulva and peri rectal  Follow up No follow-ups on file.       All questions were answered.

## 2020-05-23 ENCOUNTER — Telehealth: Payer: Self-pay | Admitting: Obstetrics & Gynecology

## 2020-05-23 ENCOUNTER — Telehealth: Payer: Self-pay | Admitting: Orthopedic Surgery

## 2020-05-23 NOTE — Telephone Encounter (Signed)
Patient called relaying that she has tried the medications prescribed at last visit, date 04/29/20, including Prednisone - states that her back is not much better. Reviewed after visit summary instructions with patient, and she has been following all but the home exercises. States she also has needed to see her gynecologist Dr Elonda Husky regarding yeast infections; asking if this issue may be a side effect of the Prednisone she had taken? Patient requests to speak with a nurse or clinical staff - ph# [phone].

## 2020-05-23 NOTE — Telephone Encounter (Signed)
She wants to make appointment to discuss continued pain with Dr Aline Brochure, Can you call to schedule follow up?

## 2020-05-23 NOTE — Telephone Encounter (Signed)
Yes, but she should be off prednisone now. It was taper given in August I called her to advise  Should not be causing it now. I called her. She voiced understanding.

## 2020-05-23 NOTE — Telephone Encounter (Signed)
Pt states that her bottom is still not cleared up completely. She has an appointment on Monday with Anderson Malta. She has done everything Dr Elonda Husky told her to do to treat it. She wanted to know if he wanted to send in more medicine or if she should just wait till Monday. Advised patient a nurse would call her back tomorrow and let her know.

## 2020-05-23 NOTE — Telephone Encounter (Signed)
Done; patient is aware of appointment.

## 2020-05-23 NOTE — Telephone Encounter (Signed)
Patient called, feels like "bottom area" is inflammed, states she feels it never completely cleared up Is on a couple medicines from Dr. Aline Brochure & wonders if this is contributing to that  Please advise (has appt with Anderson Malta 05/27/2020)  [facility]

## 2020-05-24 NOTE — Telephone Encounter (Signed)
Pt aware of Dr. Brynda Greathouse recommendations and voiced understanding. She will come to appt on Monday. [place]

## 2020-05-24 NOTE — Telephone Encounter (Signed)
I would not do anything over the weekend, that may just "muddy the waters" for re evlauation on Monday

## 2020-05-24 NOTE — Telephone Encounter (Signed)
Mailbox full @ 10:15 am. No other # listed. Will try again later. [place]

## 2020-05-27 ENCOUNTER — Ambulatory Visit: Payer: PPO | Admitting: Adult Health

## 2020-05-28 ENCOUNTER — Encounter: Payer: Self-pay | Admitting: Obstetrics and Gynecology

## 2020-05-28 ENCOUNTER — Ambulatory Visit: Payer: PPO | Admitting: Obstetrics and Gynecology

## 2020-05-28 DIAGNOSIS — L293 Anogenital pruritus, unspecified: Secondary | ICD-10-CM | POA: Insufficient documentation

## 2020-05-29 NOTE — Progress Notes (Signed)
Ms Lewey presents with c/o itching at her perineal body and perirectal. She has had problems with yeast infection in this area for the last 2 months. She has been treated with Geniviolet  X 2.   PE AF VSS Lungs clear Heart RRR Abd soft + BS GU NL EGBUS, no evidence of yeast infection, skin normal appearing, small anal fissure noted  A/P Perineal/Perirectal itching  Pt reassured that infection has resolved but skin is still healing. Recommend mixture of OTC cortisone cream A & D onit qd x 3 the just A & D onit as needed. F/U PRN

## 2020-06-05 ENCOUNTER — Telehealth: Payer: Self-pay | Admitting: Obstetrics & Gynecology

## 2020-06-05 NOTE — Telephone Encounter (Signed)
Patient called stating that she came into the office multiple times due to burning and she is still having this problem. Pt states that she would like to speak with a nurse regarding this. Please contact pt

## 2020-06-07 ENCOUNTER — Encounter: Payer: Self-pay | Admitting: Family Medicine

## 2020-06-07 NOTE — Telephone Encounter (Signed)
Just have her see me next week sometime

## 2020-06-07 NOTE — Telephone Encounter (Signed)
Pt states that she is still having itching and some burning on her bottom. She saw Dr. Rip Harbour last week and he recommended using A&D ointment daily. She has also tried sitting in a tub of baking soda water. When she is painted purple she feels better temporarily. States that she doesn't know exactly what is going on and it worries her.  She wanted to know is there anything else she can do?

## 2020-06-07 NOTE — Telephone Encounter (Signed)
Pt scheduled for visit on Monday with Dr. Elonda Husky.

## 2020-06-10 ENCOUNTER — Other Ambulatory Visit: Payer: Self-pay

## 2020-06-10 ENCOUNTER — Ambulatory Visit (INDEPENDENT_AMBULATORY_CARE_PROVIDER_SITE_OTHER): Payer: PPO | Admitting: Obstetrics & Gynecology

## 2020-06-10 VITALS — BP 145/72 | HR 97 | Wt 184.8 lb

## 2020-06-10 DIAGNOSIS — L900 Lichen sclerosus et atrophicus: Secondary | ICD-10-CM

## 2020-06-10 MED ORDER — FLUOCINONIDE EMULSIFIED BASE 0.05 % EX CREA: 1.0000 "application " | TOPICAL_CREAM | Freq: Two times a day (BID) | CUTANEOUS | 11 refills | Status: DC

## 2020-06-10 NOTE — Progress Notes (Signed)
Chief Complaint  Patient presents with  . Follow-up      69 y.o. G3P3003 No LMP recorded. Patient is postmenopausal. The current method of family planning is post menopausal status.  Outpatient Encounter Medications as of 06/10/2020  Medication Sig  . Ascorbic Acid (VITAMIN C) 100 MG tablet Take 100 mg by mouth daily.  . Calcium-Magnesium-Vitamin D (CALCIUM 1200+D3 PO) Take 1 tablet by mouth daily.  [name] 88 MCG tablet Take 1 tablet by mouth once daily  . fluticasone (FLONASE) 50 MCG/ACT nasal spray Place 2 sprays into both nostrils daily.  Marland Kitchen gabapentin (NEURONTIN) 100 MG capsule Take 1 capsule (100 mg total) by mouth 3 (three) times daily.  Marland Kitchen levocetirizine (XYZAL) 5 MG tablet TAKE 1 TABLET BY MOUTH ONCE DAILY IN THE EVENING  . lisinopril-hydrochlorothiazide (ZESTORETIC) 20-12.5 MG tablet Take 1 tablet by mouth once daily  . methocarbamol (ROBAXIN) 500 MG tablet Take 1 tablet (500 mg total) by mouth 3 (three) times daily.  . montelukast (SINGULAIR) 10 MG tablet TAKE 1 TABLET BY MOUTH AT BEDTIME  . pantoprazole (PROTONIX) 40 MG tablet Take 1 tablet (40 mg total) by mouth 2 (two) times daily before a meal. (Patient taking differently: Take 40 mg by mouth daily. )  . traZODone (DESYREL) 50 MG tablet TAKE 1/2 TO 1 (ONE-HALF TO ONE) TABLET BY MOUTH ONCE DAILY AT BEDTIME AS NEEDED FOR SLEEP  . vitamin B-12 (CYANOCOBALAMIN) 500 MCG tablet Take 1,000 mcg by mouth in the morning and at bedtime.  . fluocinonide-emollient (LIDEX-E) 0.05 % cream Apply 1 application topically 2 (two) times daily.   No facility-administered encounter medications on file as of 06/10/2020.    Subjective Pt has had candida vulvovaginitis Treated effectively Continues to have irritation with the itching having essentially resolved Exam is negative for yeast Past Medical History:  Diagnosis Date  . Allergy   . Cataract   . GERD (gastroesophageal reflux disease)   . Hyperlipidemia   . Hypertension     . PONV (postoperative nausea and vomiting)   . Thyroid disease     Past Surgical History:  Procedure Laterality Date  . ambulatory esophageal 24 hour pH study  2004   within normal range. Dr. Johnathan Hausen  . COLONOSCOPY N/A 11/04/2016   Procedure: COLONOSCOPY;  Surgeon: Daneil Dolin, MD; diverticulosis in the sigmoid and descending colon, otherwise normal.  Repeat in 2028.  Marland Kitchen ESOPHAGEAL MANOMETRY  2004   Dr. Johnathan Hausen: normal  . ESOPHAGOGASTRODUODENOSCOPY  2003   Dr. Johnathan Hausen: normal   . ESOPHAGOGASTRODUODENOSCOPY N/A 11/04/2016   Procedure: ESOPHAGOGASTRODUODENOSCOPY (EGD);  Surgeon: Daneil Dolin, MD; LA grade B esophagitis s/p dilation, normal stomach and duodenum.  Marland Kitchen EYE SURGERY     cataracts  . FRACTURE SURGERY     cataract surgery   . LIGAMENT REPAIR     rt knee  . MALONEY DILATION N/A 11/04/2016   Procedure: Venia Minks DILATION;  Surgeon: Daneil Dolin, MD;  [facility]: [facility];  Service: Endoscopy;  Laterality: N/A;  . TUBAL LIGATION      OB History    Gravida  3   Para  3   Term  3   Preterm      AB      Living  3     SAB      TAB      Ectopic      Multiple      Live Births  3  Allergies  Allergen Reactions  . Codeine Nausea Only    Social History   Socioeconomic History  . Marital status: Divorced    Spouse name: Not on file  . Number of children: Not on file  . Years of education: Not on file  . Highest education level: Not on file  Occupational History  . Not on file  Tobacco Use  . Smoking status: Never Smoker  . Smokeless tobacco: Never Used  Vaping Use  . Vaping Use: Never used  Substance and Sexual Activity  . Alcohol use: No  . Drug use: No  . Sexual activity: Not Currently  Other Topics Concern  . Not on file  Social History Narrative  . Not on file   Social Determinants of Health   Financial Resource Strain:   . Difficulty of Paying Living Expenses: Not on file  Food Insecurity:   .  Worried About Charity fundraiser in the Last Year: Not on file  . Ran Out of Food in the Last Year: Not on file  Transportation Needs:   . Lack of Transportation (Medical): Not on file  . Lack of Transportation (Non-Medical): Not on file  Physical Activity:   . Days of Exercise per Week: Not on file  . Minutes of Exercise per Session: Not on file  Stress:   . Feeling of Stress : Not on file  Social Connections:   . Frequency of Communication with Friends and Family: Not on file  . Frequency of Social Gatherings with Friends and Family: Not on file  . Attends Religious Services: Not on file  . Active Member of Clubs or Organizations: Not on file  . Attends Archivist Meetings: Not on file  . Marital Status: Not on file    Family History  Problem Relation Age of Onset  . Hypertension Mother   . Arthritis Father        Rheumatoid   . Diabetes Father   . Hypertension Father   . Thyroid disease Father   . Arthritis Brother        Rheumatoid  . Thyroid disease Brother   . Heart disease Maternal Grandmother        stomach cancer  . Hypertension Maternal Grandmother   . Cancer Paternal Grandmother        LEUKEMIA/LUNG CNACER,  . Thyroid disease Paternal Grandmother   . Hypertension Paternal Grandmother   . Cancer Other   . Mental illness Other   . Cancer Maternal Uncle   . Thyroid disease Paternal Aunt   . Colon cancer Neg Hx     Medications:       Current Outpatient Medications:  .  Ascorbic Acid (VITAMIN C) 100 MG tablet, Take 100 mg by mouth daily., Disp: , Rfl:  .  Calcium-Magnesium-Vitamin D (CALCIUM 1200+D3 PO), Take 1 tablet by mouth daily., Disp: , Rfl:  .  EUTHYROX 88 MCG tablet, Take 1 tablet by mouth once daily, Disp: 90 tablet, Rfl: 0 .  fluticasone (FLONASE) 50 MCG/ACT nasal spray, Place 2 sprays into both nostrils daily., Disp: 16 g, Rfl: 6 .  gabapentin (NEURONTIN) 100 MG capsule, Take 1 capsule (100 mg total) by mouth 3 (three) times daily., Disp:  90 capsule, Rfl: 2 .  levocetirizine (XYZAL) 5 MG tablet, TAKE 1 TABLET BY MOUTH ONCE DAILY IN THE EVENING, Disp: 90 tablet, Rfl: 0 .  lisinopril-hydrochlorothiazide (ZESTORETIC) 20-12.5 MG tablet, Take 1 tablet by mouth once daily, Disp: 90 tablet, Rfl: 0 .  methocarbamol (ROBAXIN) 500 MG tablet, Take 1 tablet (500 mg total) by mouth 3 (three) times daily., Disp: 60 tablet, Rfl: 1 .  montelukast (SINGULAIR) 10 MG tablet, TAKE 1 TABLET BY MOUTH AT BEDTIME, Disp: 90 tablet, Rfl: 0 .  pantoprazole (PROTONIX) 40 MG tablet, Take 1 tablet (40 mg total) by mouth 2 (two) times daily before a meal. (Patient taking differently: Take 40 mg by mouth daily. ), Disp: 60 tablet, Rfl: 5 .  traZODone (DESYREL) 50 MG tablet, TAKE 1/2 TO 1 (ONE-HALF TO ONE) TABLET BY MOUTH ONCE DAILY AT BEDTIME AS NEEDED FOR SLEEP, Disp: 90 tablet, Rfl: 1 .  vitamin B-12 (CYANOCOBALAMIN) 500 MCG tablet, Take 1,000 mcg by mouth in the morning and at bedtime., Disp: , Rfl:  .  fluocinonide-emollient (LIDEX-E) 0.05 % cream, Apply 1 application topically 2 (two) times daily., Disp: 30 g, Rfl: 11  Objective Blood pressure (!) 145/72, pulse 97, weight 184 lb 12.8 oz (83.8 kg).  No yeast changes noted Distribution is consistent with LSA  Pertinent ROS No burning with urination, frequency or urgency No nausea, vomiting or diarrhea Nor fever chills or other constitutional symptoms   Labs or studies     Impression Diagnoses this Encounter::   [id number]   1. 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 fluocinonide-emollient (LIDEX-E) 0.05 % cream    Sig: Apply 1 application topically 2 (two) times daily.    Dispense:  30 g    Refill:  11    Labs or Scans Ordered: No orders of the defined types were placed in this encounter.   Management:: Lidex E cream twice a day  Follow up Return in about 6 weeks (around  07/22/2020) for Follow up, with Dr Elonda Husky.        All questions were answered.

## 2020-06-13 ENCOUNTER — Ambulatory Visit: Payer: PPO | Admitting: Orthopedic Surgery

## 2020-07-03 ENCOUNTER — Other Ambulatory Visit: Payer: Self-pay | Admitting: Family Medicine

## 2020-07-22 ENCOUNTER — Encounter: Payer: Self-pay | Admitting: Family Medicine

## 2020-07-22 ENCOUNTER — Encounter: Payer: Self-pay | Admitting: Obstetrics & Gynecology

## 2020-07-22 ENCOUNTER — Other Ambulatory Visit: Payer: Self-pay

## 2020-07-22 ENCOUNTER — Ambulatory Visit (INDEPENDENT_AMBULATORY_CARE_PROVIDER_SITE_OTHER): Payer: PPO | Admitting: Family Medicine

## 2020-07-22 ENCOUNTER — Ambulatory Visit (INDEPENDENT_AMBULATORY_CARE_PROVIDER_SITE_OTHER): Payer: PPO | Admitting: Obstetrics & Gynecology

## 2020-07-22 VITALS — BP 124/68 | HR 74 | Temp 97.8°F | Resp 16 | Ht 64.0 in | Wt 181.0 lb

## 2020-07-22 VITALS — BP 152/80 | HR 86 | Ht 64.0 in | Wt 181.0 lb

## 2020-07-22 DIAGNOSIS — E038 Other specified hypothyroidism: Secondary | ICD-10-CM

## 2020-07-22 DIAGNOSIS — R7303 Prediabetes: Secondary | ICD-10-CM

## 2020-07-22 DIAGNOSIS — E782 Mixed hyperlipidemia: Secondary | ICD-10-CM | POA: Diagnosis not present

## 2020-07-22 DIAGNOSIS — E6609 Other obesity due to excess calories: Secondary | ICD-10-CM

## 2020-07-22 DIAGNOSIS — Z6831 Body mass index (BMI) 31.0-31.9, adult: Secondary | ICD-10-CM

## 2020-07-22 DIAGNOSIS — L900 Lichen sclerosus et atrophicus: Secondary | ICD-10-CM | POA: Diagnosis not present

## 2020-07-22 DIAGNOSIS — F5104 Psychophysiologic insomnia: Secondary | ICD-10-CM

## 2020-07-22 DIAGNOSIS — E538 Deficiency of other specified B group vitamins: Secondary | ICD-10-CM | POA: Diagnosis not present

## 2020-07-22 DIAGNOSIS — K219 Gastro-esophageal reflux disease without esophagitis: Secondary | ICD-10-CM

## 2020-07-22 DIAGNOSIS — I10 Essential (primary) hypertension: Secondary | ICD-10-CM

## 2020-07-22 NOTE — Assessment & Plan Note (Signed)
Oral supplement, recheck

## 2020-07-22 NOTE — Assessment & Plan Note (Signed)
Controlled no changes 

## 2020-07-22 NOTE — Progress Notes (Signed)
Chief Complaint  Patient presents with  . Follow-up      69 y.o. G3P3003 No LMP recorded. Patient is postmenopausal. The current method of family planning is post menopausal status.  Outpatient Encounter Medications as of 07/22/2020  Medication Sig  . Ascorbic Acid (VITAMIN C) 100 MG tablet Take 100 mg by mouth daily.  . Calcium-Magnesium-Vitamin D (CALCIUM 1200+D3 PO) Take 1 tablet by mouth daily.  [name] 88 MCG tablet Take 1 tablet by mouth once daily  . fluocinonide-emollient (LIDEX-E) 0.05 % cream Apply 1 application topically 2 (two) times daily.  . fluticasone (FLONASE) 50 MCG/ACT nasal spray Place 2 sprays into both nostrils daily.  Marland Kitchen gabapentin (NEURONTIN) 100 MG capsule Take 1 capsule (100 mg total) by mouth 3 (three) times daily.  Marland Kitchen levocetirizine (XYZAL) 5 MG tablet TAKE 1 TABLET BY MOUTH ONCE DAILY IN THE EVENING  . lisinopril-hydrochlorothiazide (ZESTORETIC) 20-12.5 MG tablet Take 1 tablet by mouth once daily  . methocarbamol (ROBAXIN) 500 MG tablet Take 1 tablet (500 mg total) by mouth 3 (three) times daily.  . montelukast (SINGULAIR) 10 MG tablet TAKE 1 TABLET BY MOUTH AT BEDTIME  . pantoprazole (PROTONIX) 40 MG tablet Take 1 tablet (40 mg total) by mouth 2 (two) times daily before a meal. (Patient taking differently: Take 40 mg by mouth daily.)  . traZODone (DESYREL) 50 MG tablet TAKE 1/2 TO 1 (ONE-HALF TO ONE) TABLET BY MOUTH ONCE DAILY AT BEDTIME AS NEEDED FOR SLEEP  . vitamin B-12 (CYANOCOBALAMIN) 500 MCG tablet Take 1,000 mcg by mouth in the morning and at bedtime.   No facility-administered encounter medications on file as of 07/22/2020.    Subjective Pt has had vulvovaginitis since the summer Has been treated with diflucan and gentian violet and candida has benn eliminated as a surce of her symtpoms  Pt reports "significant" improvement on the Lidex E .05%  Past Medical History:  Diagnosis Date  . Allergy   . Cataract   . GERD  (gastroesophageal reflux disease)   . Hyperlipidemia   . Hypertension   . PONV (postoperative nausea and vomiting)   . Thyroid disease     Past Surgical History:  Procedure Laterality Date  . ambulatory esophageal 24 hour pH study  2004   within normal range. Dr. Johnathan Hausen  . COLONOSCOPY N/A 11/04/2016   Procedure: COLONOSCOPY;  Surgeon: Daneil Dolin, MD; diverticulosis in the sigmoid and descending colon, otherwise normal.  Repeat in 2028.  Marland Kitchen ESOPHAGEAL MANOMETRY  2004   Dr. Johnathan Hausen: normal  . ESOPHAGOGASTRODUODENOSCOPY  2003   Dr. Johnathan Hausen: normal   . ESOPHAGOGASTRODUODENOSCOPY N/A 11/04/2016   Procedure: ESOPHAGOGASTRODUODENOSCOPY (EGD);  Surgeon: Daneil Dolin, MD; LA grade B esophagitis s/p dilation, normal stomach and duodenum.  Marland Kitchen EYE SURGERY     cataracts  . FRACTURE SURGERY     cataract surgery   . LIGAMENT REPAIR     rt knee  . MALONEY DILATION N/A 11/04/2016   Procedure: Venia Minks DILATION;  Surgeon: Daneil Dolin, MD;  [facility]: [facility];  Service: Endoscopy;  Laterality: N/A;  . TUBAL LIGATION      OB History    Gravida  3   Para  3   Term  3   Preterm      AB      Living  3     SAB      IAB      Ectopic  Multiple      Live Births  3           Allergies  Allergen Reactions  . Codeine Nausea Only    Social History   Socioeconomic History  . Marital status: Divorced    Spouse name: Not on file  . Number of children: Not on file  . Years of education: Not on file  . Highest education level: Not on file  Occupational History  . Not on file  Tobacco Use  . Smoking status: Never Smoker  . Smokeless tobacco: Never Used  Vaping Use  . Vaping Use: Never used  Substance and Sexual Activity  . Alcohol use: No  . Drug use: No  . Sexual activity: Not Currently  Other Topics Concern  . Not on file  Social History Narrative  . Not on file   Social Determinants of Health   Financial Resource Strain: Not on  file  Food Insecurity: Not on file  Transportation Needs: Not on file  Physical Activity: Not on file  Stress: Not on file  Social Connections: Not on file    Family History  Problem Relation Age of Onset  . Hypertension Mother   . Arthritis Father        Rheumatoid   . Diabetes Father   . Hypertension Father   . Thyroid disease Father   . Arthritis Brother        Rheumatoid  . Thyroid disease Brother   . Heart disease Maternal Grandmother        stomach cancer  . Hypertension Maternal Grandmother   . Cancer Paternal Grandmother        LEUKEMIA/LUNG CNACER,  . Thyroid disease Paternal Grandmother   . Hypertension Paternal Grandmother   . Cancer Other   . Mental illness Other   . Cancer Maternal Uncle   . Thyroid disease Paternal Aunt   . Colon cancer Neg Hx     Medications:       Current Outpatient Medications:  .  Ascorbic Acid (VITAMIN C) 100 MG tablet, Take 100 mg by mouth daily., Disp: , Rfl:  .  Calcium-Magnesium-Vitamin D (CALCIUM 1200+D3 PO), Take 1 tablet by mouth daily., Disp: , Rfl:  .  EUTHYROX 88 MCG tablet, Take 1 tablet by mouth once daily, Disp: 90 tablet, Rfl: 0 .  fluocinonide-emollient (LIDEX-E) 0.05 % cream, Apply 1 application topically 2 (two) times daily., Disp: 30 g, Rfl: 11 .  fluticasone (FLONASE) 50 MCG/ACT nasal spray, Place 2 sprays into both nostrils daily., Disp: 16 g, Rfl: 6 .  gabapentin (NEURONTIN) 100 MG capsule, Take 1 capsule (100 mg total) by mouth 3 (three) times daily., Disp: 90 capsule, Rfl: 2 .  levocetirizine (XYZAL) 5 MG tablet, TAKE 1 TABLET BY MOUTH ONCE DAILY IN THE EVENING, Disp: 90 tablet, Rfl: 0 .  lisinopril-hydrochlorothiazide (ZESTORETIC) 20-12.5 MG tablet, Take 1 tablet by mouth once daily, Disp: 90 tablet, Rfl: 0 .  methocarbamol (ROBAXIN) 500 MG tablet, Take 1 tablet (500 mg total) by mouth 3 (three) times daily., Disp: 60 tablet, Rfl: 1 .  montelukast (SINGULAIR) 10 MG tablet, TAKE 1 TABLET BY MOUTH AT BEDTIME, Disp:  90 tablet, Rfl: 0 .  pantoprazole (PROTONIX) 40 MG tablet, Take 1 tablet (40 mg total) by mouth 2 (two) times daily before a meal. (Patient taking differently: Take 40 mg by mouth daily.), Disp: 60 tablet, Rfl: 5 .  traZODone (DESYREL) 50 MG tablet, TAKE 1/2 TO 1 (ONE-HALF TO ONE) TABLET  BY MOUTH ONCE DAILY AT BEDTIME AS NEEDED FOR SLEEP, Disp: 90 tablet, Rfl: 1 .  vitamin B-12 (CYANOCOBALAMIN) 500 MCG tablet, Take 1,000 mcg by mouth in the morning and at bedtime., Disp: , Rfl:   Objective Blood pressure (!) 152/80, pulse 86, height 5\' 4"  (1.626 m), weight 181 lb (82.1 kg).  Improved skin and mucsoal condition no excoriations noted Really looks normal atrophic  Pertinent ROS No burning with urination, frequency or urgency No nausea, vomiting or diarrhea Nor fever chills or other constitutional symptoms   Labs or studies     Impression Diagnoses this Encounter::   [id number]   1. Lichen sclerosus et atrophicus, diagnosed 11/21  L90.0    "significantly" improved    Established relevant diagnosis(es):   Plan/Recommendations: No orders of the defined types were placed in this encounter.   Labs or Scans Ordered: No orders of the defined types were placed in this encounter.   Management:: Continue lidex e cream twice daily  Follow up Return in about 3 months (around 10/20/2020).        All questions were answered.

## 2020-07-22 NOTE — Assessment & Plan Note (Signed)
Doing well with trazodone

## 2020-07-22 NOTE — Patient Instructions (Addendum)
We will call with lab results  F/U April with [facility] VISIT  Janett Billow

## 2020-07-22 NOTE — Assessment & Plan Note (Signed)
Continue PPI, symptoms controlled discussed some dietary changes to also help with pre-diabetes

## 2020-07-22 NOTE — Assessment & Plan Note (Signed)
Discussed dietary changes, reducing sugars, carbs Has pre diabets, HTN, HDL higher risk for CAD

## 2020-07-22 NOTE — Progress Notes (Signed)
   Subjective:    Patient ID: Jill Roach, female    DOB: Feb 11, 1951, 69 y.o.   MRN: [id number]  Patient presents for Follow-up (Is fasting/) Patient here to follow-up chronic medical problems.  Medications reviewed. Hypertension she is taking lisinopril HCTZ blood pressures at home range  Hypothyroidism-  she is on levothyroxine 88 mcg once a day  Reflux taking pantoprazole daily.  Chronic insomnia she is taking trazodone B12 deficiency she was transitioned to oral supplementation back in April  Prediabetes A1c 5.7% in July this was checked by her OB/GYN due for recheck today Hyperlipidemia triglycerides elevated at 186 back in the spring of back, currently treating with dietary changes  She is following with GYN for Lichen Sclerosus , on lidex cream, she has appt this afternoon with GYN  Review Of Systems:  GEN- denies fatigue, fever, weight loss,weakness, recent illness HEENT- denies eye drainage, change in vision, nasal discharge, CVS- denies chest pain, palpitations RESP- denies SOB, cough, wheeze ABD- denies N/V, change in stools, abd pain GU- denies dysuria, hematuria, dribbling, incontinence MSK- denies joint pain, muscle aches, injury Neuro- denies headache, dizziness, syncope, seizure activity       Objective:    BP 124/68   Pulse 74   Temp 97.8 F (36.6 C) (Temporal)   Resp 16   Ht 5\' 4"  (1.626 m)   Wt 181 lb (82.1 kg)   SpO2 98%   BMI 31.07 kg/m  GEN- NAD, alert and oriented x3 HEENT- PERRL, EOMI, non injected sclera, pink conjunctiva, MMM, oropharynx clear Neck- Supple, no thyromegaly CVS- RRR, no murmur RESP-CTAB ABD-NABS,soft,NT,ND EXT- No edema Pulses- Radial, DP- 2+        Assessment & Plan:      Problem List Items Addressed This Visit      Unprioritized   B12 deficiency    Oral supplement, recheck      Relevant Orders   Vitamin B12   Chronic insomnia    Doing well with trazodone       GERD (gastroesophageal reflux  disease)    Continue PPI, symptoms controlled discussed some dietary changes to also help with pre-diabetes      Hyperlipidemia   Relevant Orders   Lipid panel   Hypertension - Primary    Controlled no changes       Relevant Orders   CBC with Differential/Platelet   Comprehensive metabolic panel   Hypothyroidism   Relevant Orders   TSH   Obesity    Discussed dietary changes, reducing sugars, carbs Has pre diabets, HTN, HDL higher risk for CAD        Other Visit Diagnoses    Pre-diabetes       Relevant Orders   Hemoglobin A1c      Note: This dictation was prepared with Dragon dictation along with smaller phrase technology. Any transcriptional errors that result from this process are unintentional.

## 2020-07-23 ENCOUNTER — Telehealth: Payer: Self-pay | Admitting: *Deleted

## 2020-07-23 LAB — LIPID PANEL
Cholesterol: 119 mg/dL (ref <200)
HDL: 44 mg/dL — ABNORMAL LOW (ref >=50)
LDL Cholesterol (Calc): 52 mg/dL (calc)
Non-HDL Cholesterol (Calc): 75 mg/dL (calc) (ref <130)
Total CHOL/HDL Ratio: 2.7 (calc) (ref <5.0)
Triglycerides: 147 mg/dL (ref <150)

## 2020-07-23 LAB — CBC WITH DIFFERENTIAL/PLATELET
Absolute Monocytes: 636 cells/uL (ref 200–950)
Basophils Absolute: 52 cells/uL (ref 0–200)
Basophils Relative: 0.7 %
Eosinophils Absolute: 59 cells/uL (ref 15–500)
Eosinophils Relative: 0.8 %
HCT: 41.1 % (ref 35.0–45.0)
Hemoglobin: 13.9 g/dL (ref 11.7–15.5)
Lymphs Abs: 1857 cells/uL (ref 850–3900)
MCH: 29.4 pg (ref 27.0–33.0)
MCHC: 33.8 g/dL (ref 32.0–36.0)
MCV: 86.9 fL (ref 80.0–100.0)
MPV: 10.4 fL (ref 7.5–12.5)
Monocytes Relative: 8.6 %
Neutro Abs: 4795 cells/uL (ref 1500–7800)
Neutrophils Relative %: 64.8 %
Platelets: 283 10*3/uL (ref 140–400)
RBC: 4.73 10*6/uL (ref 3.80–5.10)
RDW: 13 % (ref 11.0–15.0)
Total Lymphocyte: 25.1 %
WBC: 7.4 10*3/uL (ref 3.8–10.8)

## 2020-07-23 LAB — COMPREHENSIVE METABOLIC PANEL
AG Ratio: 1.7 (calc) (ref 1.0–2.5)
ALT: 19 U/L (ref 6–29)
AST: 20 U/L (ref 10–35)
Albumin: 4.4 g/dL (ref 3.6–5.1)
Alkaline phosphatase (APISO): 63 U/L (ref 37–153)
BUN: 18 mg/dL (ref 7–25)
CO2: 28 mmol/L (ref 20–32)
Calcium: 9.8 mg/dL (ref 8.6–10.4)
Chloride: 105 mmol/L (ref 98–110)
Creat: 0.87 mg/dL (ref 0.50–0.99)
Globulin: 2.6 g/dL (calc) (ref 1.9–3.7)
Glucose, Bld: 102 mg/dL — ABNORMAL HIGH (ref 65–99)
Potassium: 4.7 mmol/L (ref 3.5–5.3)
Sodium: 139 mmol/L (ref 135–146)
Total Bilirubin: 0.5 mg/dL (ref 0.2–1.2)
Total Protein: 7 g/dL (ref 6.1–8.1)

## 2020-07-23 LAB — HEMOGLOBIN A1C
Hgb A1c MFr Bld: 5.5 % of total Hgb (ref <5.7)
Mean Plasma Glucose: 111 mg/dL
eAG (mmol/L): 6.2 mmol/L

## 2020-07-23 LAB — VITAMIN B12: Vitamin B-12: 276 pg/mL (ref 200–1100)

## 2020-07-23 LAB — TSH: TSH: 1.22 mIU/L (ref 0.40–4.50)

## 2020-07-23 NOTE — Telephone Encounter (Signed)
Call placed to patient and patient made aware.  

## 2020-07-23 NOTE — Telephone Encounter (Signed)
Received call from patient.   Reports that she forgot to mention she has been having increased hair thinning and inquired if there is something OTC she can do.   MD please advise.

## 2020-07-23 NOTE — Telephone Encounter (Signed)
We dont have good treatment She can check shampoo Aisle Rogaine is still available, she can also add Biotin/hair vitamin

## 2020-07-23 NOTE — Telephone Encounter (Signed)
Call placed to patient. No answer. VM Full.

## 2020-07-24 ENCOUNTER — Other Ambulatory Visit: Payer: Self-pay | Admitting: Family Medicine

## 2020-07-24 DIAGNOSIS — J309 Allergic rhinitis, unspecified: Secondary | ICD-10-CM

## 2020-08-21 ENCOUNTER — Telehealth (INDEPENDENT_AMBULATORY_CARE_PROVIDER_SITE_OTHER): Payer: PPO | Admitting: Family Medicine

## 2020-08-21 ENCOUNTER — Encounter: Payer: Self-pay | Admitting: Family Medicine

## 2020-08-21 DIAGNOSIS — J0100 Acute maxillary sinusitis, unspecified: Secondary | ICD-10-CM | POA: Diagnosis not present

## 2020-08-21 MED ORDER — FLUCONAZOLE 150 MG PO TABS: 150.0000 mg | ORAL_TABLET | Freq: Once | ORAL | 0 refills | Status: AC

## 2020-08-21 MED ORDER — AMOXICILLIN 875 MG PO TABS
875.0000 mg | ORAL_TABLET | Freq: Two times a day (BID) | ORAL | 0 refills | Status: DC
Start: 2020-08-21 — End: 2020-09-25

## 2020-08-21 MED ORDER — BENZONATATE 100 MG PO CAPS: 100.0000 mg | ORAL_CAPSULE | Freq: Three times a day (TID) | ORAL | 0 refills | Status: DC | PRN

## 2020-08-21 NOTE — Progress Notes (Signed)
Virtual Visit via Video Note  I connected with Jill Roach on 08/21/20 at  9:15 AM EST by a video enabled telemedicine application and verified that I am speaking with the correct person using two identifiers.  Location: Patient: at home  Provider: in office    I discussed the limitations of evaluation and management by telemedicine and the availability of in person appointments. The patient expressed understanding and agreed to proceed.  History of Present Illness: Telehealth visit ascending COVID-19 pandemic.  Patient called in with sinus pressure and drainage for the past 5 to 6 days.  She does get sinus infections around this time a year.  She has been vaccinated for COVID-19 and she has had a booster.  He has not had any fever.  States that she was around 1 friend that had similar symptoms but she was wearing her mask and this was last week.  Her main concern is sinus pressure in her face postnasal drip.  She does get some cough more from the drainage but no shortness of breath no chest tightness no wheezing.  She had 1 episode of diarrhea and emesis a few days ago but this was after eating some beets do that she states did not settle well in her stomach and then she also had a donut.  She has not had any further GI upset.  She has been using her regular allergy medicines and nasal spray has also been taking mucus relief but is only mildly improved her symptoms and she had more pain in the sinuses today.   Observations/Objective: No acute distress noted over video.  Normal speech alert and oriented x3  Respiratory normal work of breathing no audible wheeze heard no cough heard.  Per patient tender to palpation on maxillary sinus  Assessment and Plan: Acute sinusitis possible symptoms due to COVID-19 however he has had symptoms for almost a week now and she has had vaccine plus booster and no real lower respiratory symptoms.  She has history of sinusitis issues as well.  I will not swab  her based on above as itwill not change management.  Recommend that she continue to quarantine within her home for the next 3 days and continue to mask in general. I will start amoxicillin 875 mg twice daily for 10 days she will continue her nasal steroid and nasal saline for mucus relief.   Tessalon Perles requested by pt.  Advised if her symptoms worsen or she has shortness of breath  alert [place] or  go to the nearest emergency room.  Follow Up Instructions:    I discussed the assessment and treatment plan with the patient. The patient was provided an opportunity to ask questions and all were answered. The patient agreed with the plan and demonstrated an understanding of the instructions.   The patient was advised to call back or seek an in-person evaluation if the symptoms worsen or if the condition fails to improve as anticipated.  I provided 13 minutes of non-face-to-face time during this encounter. End Time 9:27am   Vic Blackbird, MD

## 2020-09-24 NOTE — Progress Notes (Signed)
Referring Provider: Alycia Rossetti, MD Primary Care Physician:  Alycia Rossetti, MD Primary GI: Dr. Gala Romney    Chief Complaint  Patient presents with  . Follow-up  . Gastroesophageal Reflux  . Constipation    HPI:   Jill Roach is a 70 y.o. female presenting today with a history of GERD, globus sensation, and constipation. EGD in 2018 with LA grade B esophagitis s/p dilation, normal stomach and duodenum.BPE 2018 with moderate to marked esophageal dysmotility.Manometry and ambulatory esophageal 24 hour pH monitoringin 2004negative.ENT evaluation in 2019 for globus sensation was related to LPR. Colonoscopy in 2018 with diverticulosis, otherwise normal. Repeat in 2028.   GERD: noticed flare in GERD after eating late. Son lives across the street, and she will eat dinner over there. Lately has had spaghetti and pizza. Eats, then goes home and lays down. Knows she needs to change this behavior, avoid eating late, and change diet. Has noticed some gas lately but no abdominal pain. No solid food dysphagia. Globus sensation recently with GERD flare.   Constipation: doing well from constipation standpoint. No rectal bleeding. No OTC agents needed. Not taking fiber. Once in awhile has to use a suppository, maybe once a week.   Past Medical History:  Diagnosis Date  . Allergy   . Cataract   . GERD (gastroesophageal reflux disease)   . Hyperlipidemia   . Hypertension   . PONV (postoperative nausea and vomiting)   . Thyroid disease     Past Surgical History:  Procedure Laterality Date  . ambulatory esophageal 24 hour pH study  2004   within normal range. Dr. Johnathan Hausen  . COLONOSCOPY N/A 11/04/2016   Procedure: COLONOSCOPY;  Surgeon: Daneil Dolin, MD; diverticulosis in the sigmoid and descending colon, otherwise normal.  Repeat in 2028.  Marland Kitchen ESOPHAGEAL MANOMETRY  2004   Dr. Johnathan Hausen: normal  . ESOPHAGOGASTRODUODENOSCOPY  2003   Dr. Johnathan Hausen: normal    . ESOPHAGOGASTRODUODENOSCOPY N/A 11/04/2016   Procedure: ESOPHAGOGASTRODUODENOSCOPY (EGD);  Surgeon: Daneil Dolin, MD; LA grade B esophagitis s/p dilation, normal stomach and duodenum.  Marland Kitchen EYE SURGERY     cataracts  . FRACTURE SURGERY     cataract surgery   . LIGAMENT REPAIR     rt knee  . MALONEY DILATION N/A 11/04/2016   Procedure: Venia Minks DILATION;  Surgeon: Daneil Dolin, MD;  [facility]: [facility];  Service: Endoscopy;  Laterality: N/A;  . TUBAL LIGATION      Current Outpatient Medications  Medication Sig Dispense Refill  . fluocinonide-emollient (LIDEX-E) 0.05 % cream Apply 1 application topically 2 (two) times daily. 30 g 11  . fluticasone (FLONASE) 50 MCG/ACT nasal spray Place 2 sprays into both nostrils daily. 16 g 6  . levocetirizine (XYZAL) 5 MG tablet TAKE 1 TABLET BY MOUTH ONCE DAILY IN THE EVENING 90 tablet 0  . lisinopril-hydrochlorothiazide (ZESTORETIC) 20-12.5 MG tablet Take 1 tablet by mouth once daily 90 tablet 0  . montelukast (SINGULAIR) 10 MG tablet TAKE 1 TABLET BY MOUTH AT BEDTIME 90 tablet 0  . pantoprazole (PROTONIX) 40 MG tablet Take 1 tablet (40 mg total) by mouth 2 (two) times daily before a meal. (Patient taking differently: Take 40 mg by mouth daily.) 60 tablet 5  . traZODone (DESYREL) 50 MG tablet TAKE 1/2 TO 1 (ONE-HALF TO ONE) TABLET BY MOUTH ONCE DAILY AT BEDTIME AS NEEDED FOR SLEEP 90 tablet 1  . vitamin B-12 (CYANOCOBALAMIN) 500 MCG tablet Take  1,000 mcg by mouth in the morning and at bedtime.    Marland Kitchen amoxicillin (AMOXIL) 875 MG tablet Take 1 tablet (875 mg total) by mouth 2 (two) times daily. (Patient not taking: Reported on 09/25/2020) 20 tablet 0  . Ascorbic Acid (VITAMIN C) 100 MG tablet Take 100 mg by mouth daily. (Patient not taking: Reported on 09/25/2020)    . benzonatate (TESSALON) 100 MG capsule Take 1 capsule (100 mg total) by mouth 3 (three) times daily as needed for cough. (Patient not taking: Reported on 09/25/2020) 20 capsule 0  .  Calcium-Magnesium-Vitamin D (CALCIUM 1200+D3 PO) Take 1 tablet by mouth daily. (Patient not taking: Reported on 09/25/2020)    . EUTHYROX 88 MCG tablet Take 1 tablet by mouth once daily (Patient not taking: Reported on 09/25/2020) 90 tablet 0  . gabapentin (NEURONTIN) 100 MG capsule Take 1 capsule (100 mg total) by mouth 3 (three) times daily. (Patient not taking: Reported on 09/25/2020) 90 capsule 2  . methocarbamol (ROBAXIN) 500 MG tablet Take 1 tablet (500 mg total) by mouth 3 (three) times daily. (Patient not taking: Reported on 09/25/2020) 60 tablet 1   No current facility-administered medications for this visit.    Allergies as of 09/25/2020 - Review Complete 09/25/2020  Allergen Reaction Noted  . Codeine Nausea Only 09/20/2015    Family History  Problem Relation Age of Onset  . Hypertension Mother   . Arthritis Father        Rheumatoid   . Diabetes Father   . Hypertension Father   . Thyroid disease Father   . Arthritis Brother        Rheumatoid  . Thyroid disease Brother   . Heart disease Maternal Grandmother        stomach cancer  . Hypertension Maternal Grandmother   . Cancer Paternal Grandmother        LEUKEMIA/LUNG CNACER,  . Thyroid disease Paternal Grandmother   . Hypertension Paternal Grandmother   . Cancer Other   . Mental illness Other   . Cancer Maternal Uncle   . Thyroid disease Paternal Aunt   . Colon cancer Neg Hx     Social History   Socioeconomic History  . Marital status: Divorced    Spouse name: Not on file  . Number of children: Not on file  . Years of education: Not on file  . Highest education level: Not on file  Occupational History  . Not on file  Tobacco Use  . Smoking status: Never Smoker  . Smokeless tobacco: Never Used  Vaping Use  . Vaping Use: Never used  Substance and Sexual Activity  . Alcohol use: No  . Drug use: No  . Sexual activity: Not Currently  Other Topics Concern  . Not on file  Social History Narrative  . Not on  file   Social Determinants of Health   Financial Resource Strain: Not on file  Food Insecurity: Not on file  Transportation Needs: Not on file  Physical Activity: Not on file  Stress: Not on file  Social Connections: Not on file    Review of Systems: Gen: Denies fever, chills, anorexia. Denies fatigue, weakness, weight loss.  CV: Denies chest pain, palpitations, syncope, peripheral edema, and claudication. Resp: Denies dyspnea at rest, cough, wheezing, coughing up blood, and pleurisy. GI: see HPI Derm: Denies rash, itching, dry skin Psych: Denies depression, anxiety, memory loss, confusion. No homicidal or suicidal ideation.  Heme: Denies bruising, bleeding, and enlarged lymph nodes.  Physical Exam:  BP (!) 150/79   Pulse 77   Temp (!) 96.4 F (35.8 C) (Temporal)   Ht 5\' 4"  (1.626 m)   Wt 183 lb 6.4 oz (83.2 kg)   BMI 31.48 kg/m  General:   Alert and oriented. No distress noted. Pleasant and cooperative.  Head:  Normocephalic and atraumatic. Eyes:  Conjuctiva clear without scleral icterus. Mouth:  Mask in place Abdomen:  +BS, soft, non-tender and non-distended. No rebound or guarding. No HSM or masses noted. Msk:  Symmetrical without gross deformities. Normal posture. Extremities:  Without edema. Neurologic:  Alert and  oriented x4 Psych:  Alert and cooperative. Normal mood and affect.  ASSESSMENT: Jill Roach is a 70 y.o. female presenting today with flare in GERD after dietary changes and eating late. Recurrent globus sensation noted but without dysphagia.   Prior evaluation extensive including last EGD in 2018 with known esophagitis, BPE 2018 with moderate to marked esophageal dysmotility.Manometry and ambulatory esophageal 24 hour pH monitoringin 2004negative.ENT evaluation in 2019 for globus sensation was related to LPR.  Globus sensation likely due to LPR, uncontrolled GERD after recent diet/behavior changes. Will increase Protonix to BID for 2-4 weeks,  then reduce down to once daily. If persistent symptoms, she is to call. Avoidance of late night eating, laying down after eating, and diet changes discussed.  Increased flatus: no abdominal pain. Dietary-related. Provided gas handout. May take Gas-x as needed. No alarm signs/symptoms.     PLAN:  PPI BID for 2-4 weeks, then back to once daily GERD dietary/behavior modifications Gas diet provided 6-8 month return  Call if no improvement  Annitta Needs, PhD, [facility]

## 2020-09-25 ENCOUNTER — Encounter: Payer: Self-pay | Admitting: Gastroenterology

## 2020-09-25 ENCOUNTER — Ambulatory Visit: Payer: PPO | Admitting: Gastroenterology

## 2020-09-25 ENCOUNTER — Ambulatory Visit (INDEPENDENT_AMBULATORY_CARE_PROVIDER_SITE_OTHER): Payer: PPO | Admitting: Gastroenterology

## 2020-09-25 ENCOUNTER — Other Ambulatory Visit: Payer: Self-pay

## 2020-09-25 VITALS — BP 150/79 | HR 77 | Temp 96.4°F | Ht 64.0 in | Wt 183.4 lb

## 2020-09-25 DIAGNOSIS — R198 Other specified symptoms and signs involving the digestive system and abdomen: Secondary | ICD-10-CM

## 2020-09-25 DIAGNOSIS — K2100 Gastro-esophageal reflux disease with esophagitis, without bleeding: Secondary | ICD-10-CM

## 2020-09-25 DIAGNOSIS — R0989 Other specified symptoms and signs involving the circulatory and respiratory systems: Secondary | ICD-10-CM

## 2020-09-25 MED ORDER — PANTOPRAZOLE SODIUM 40 MG PO TBEC: 40.0000 mg | DELAYED_RELEASE_TABLET | Freq: Two times a day (BID) | ORAL | 3 refills | Status: DC

## 2020-09-25 NOTE — Patient Instructions (Signed)
I have increased Protonix for twice a day for now. Take this 30 minutes before breakfast and dinner for the next 2-4 weeks, then you can back down to just once per day. Let me know if you still have issues!  Avoid eating late as you are trying to do. Avoid laying down 2-3 hours after eating.   I have included a handout on gas-causing foods. You may take gas-x as needed.   We will see you in 6-8 months! Please call if no improvement.   It was a pleasure to see you today. I want to create trusting relationships with patients to provide genuine, compassionate, and quality care. I value your feedback. If you receive a survey regarding your visit,  I greatly appreciate you taking time to fill this out.   Annitta Needs, PhD, ANP-BC [facility] Gastroenter[facility]

## 2020-09-25 NOTE — Progress Notes (Signed)
Cc'ed to pcp °

## 2020-10-02 ENCOUNTER — Other Ambulatory Visit: Payer: Self-pay | Admitting: Family Medicine

## 2020-10-14 ENCOUNTER — Ambulatory Visit: Payer: PPO | Admitting: Obstetrics & Gynecology

## 2020-10-14 ENCOUNTER — Other Ambulatory Visit: Payer: Self-pay

## 2020-10-14 ENCOUNTER — Encounter: Payer: Self-pay | Admitting: Obstetrics & Gynecology

## 2020-10-14 VITALS — BP 144/79 | HR 78 | Wt 184.0 lb

## 2020-10-14 DIAGNOSIS — B373 Candidiasis of vulva and vagina: Secondary | ICD-10-CM | POA: Diagnosis not present

## 2020-10-14 DIAGNOSIS — L900 Lichen sclerosus et atrophicus: Secondary | ICD-10-CM

## 2020-10-14 DIAGNOSIS — B3731 Acute candidiasis of vulva and vagina: Secondary | ICD-10-CM

## 2020-10-14 NOTE — Progress Notes (Signed)
Chief Complaint  Patient presents with  . Follow-up      70 y.o. [id number] No LMP recorded. Patient is postmenopausal. The current method of family planning is post menopausal status.  Outpatient Encounter Medications as of 10/14/2020  Medication Sig  . EUTHYROX 88 MCG tablet Take 1 tablet by mouth once daily  . fluocinonide-emollient (LIDEX-E) 0.05 % cream Apply 1 application topically 2 (two) times daily.  . fluticasone (FLONASE) 50 MCG/ACT nasal spray Place 2 sprays into both nostrils daily.  Marland Kitchen levocetirizine (XYZAL) 5 MG tablet TAKE 1 TABLET BY MOUTH ONCE DAILY IN THE EVENING  . lisinopril-hydrochlorothiazide (ZESTORETIC) 20-12.5 MG tablet Take 1 tablet by mouth once daily  . montelukast (SINGULAIR) 10 MG tablet TAKE 1 TABLET BY MOUTH AT BEDTIME  . pantoprazole (PROTONIX) 40 MG tablet Take 1 tablet (40 mg total) by mouth 2 (two) times daily before a meal. (Patient taking differently: Take 40 mg by mouth daily.)  . pantoprazole (PROTONIX) 40 MG tablet Take 1 tablet (40 mg total) by mouth 2 (two) times daily before a meal.  . traZODone (DESYREL) 50 MG tablet TAKE 1/2 TO 1 (ONE-HALF TO ONE) TABLET BY MOUTH ONCE DAILY AT BEDTIME AS NEEDED FOR SLEEP  . vitamin B-12 (CYANOCOBALAMIN) 500 MCG tablet Take 1,000 mcg by mouth in the morning and at bedtime.   No facility-administered encounter medications on file as of 10/14/2020.    Subjective I have been seeing Ms. Alessandrini now since last fall for yeast vulvovaginitis on top of lichen sclerosis atrophicus I was able to manage the candidal infection successfully with Diflucan and gentian violet Since then she has been using Lidex EE 0.05% cream twice daily She has responded favorably with complete resolution of her symptoms until distal little irritation last week or week before last which coincidentally has kind of been the start of seasonal allergies and pollen The patient is an allergy suffer and uses Flonase and Xyzal which often makes  lichen sclerosus flare If 10 was the worst itching she has had with this it is now about a 2  Past Medical History:  Diagnosis Date  . Allergy   . Cataract   . GERD (gastroesophageal reflux disease)   . Hyperlipidemia   . Hypertension   . PONV (postoperative nausea and vomiting)   . Thyroid disease     Past Surgical History:  Procedure Laterality Date  . ambulatory esophageal 24 hour pH study  2004   within normal range. Dr. Johnathan Hausen  . COLONOSCOPY N/A 11/04/2016   diverticulosis, otherwise normal. Repeat in 2028.  Marland Kitchen ESOPHAGEAL MANOMETRY  2004   Dr. Johnathan Hausen: normal  . ESOPHAGOGASTRODUODENOSCOPY  2003   Dr. Johnathan Hausen: normal   . ESOPHAGOGASTRODUODENOSCOPY N/A 11/04/2016   LA grade B esophagitis s/p dilation, normal stomach and duodenum  . EYE SURGERY     cataracts  . FRACTURE SURGERY     cataract surgery   . LIGAMENT REPAIR     rt knee  . MALONEY DILATION N/A 11/04/2016   Procedure: Venia Minks DILATION;  Surgeon: Daneil Dolin, MD;  Location: [facility];  Service: Endoscopy;  Laterality: N/A;  . TUBAL LIGATION      OB History    Gravida  3   Para  3   Term  3   Preterm      AB      Living  3     SAB      IAB  Ectopic      Multiple      Live Births  3           Allergies  Allergen Reactions  . Codeine Nausea Only    Social History   Socioeconomic History  . Marital status: Divorced    Spouse name: Not on file  . Number of children: Not on file  . Years of education: Not on file  . Highest education level: Not on file  Occupational History  . Not on file  Tobacco Use  . Smoking status: Never Smoker  . Smokeless tobacco: Never Used  Vaping Use  . Vaping Use: Never used  Substance and Sexual Activity  . Alcohol use: No  . Drug use: No  . Sexual activity: Not Currently  Other Topics Concern  . Not on file  Social History Narrative  . Not on file   Social Determinants of Health   Financial Resource Strain:  Not on file  Food Insecurity: Not on file  Transportation Needs: Not on file  Physical Activity: Not on file  Stress: Not on file  Social Connections: Not on file    Family History  Problem Relation Age of Onset  . Hypertension Mother   . Arthritis Father        Rheumatoid   . Diabetes Father   . Hypertension Father   . Thyroid disease Father   . Arthritis Brother        Rheumatoid  . Thyroid disease Brother   . Heart disease Maternal Grandmother        stomach cancer  . Hypertension Maternal Grandmother   . Cancer Paternal Grandmother        LEUKEMIA/LUNG CNACER,  . Thyroid disease Paternal Grandmother   . Hypertension Paternal Grandmother   . Cancer Other   . Mental illness Other   . Cancer Maternal Uncle   . Thyroid disease Paternal Aunt   . Colon cancer Neg Hx     Medications:       Current Outpatient Medications:  .  EUTHYROX 88 MCG tablet, Take 1 tablet by mouth once daily, Disp: 90 tablet, Rfl: 0 .  fluocinonide-emollient (LIDEX-E) 0.05 % cream, Apply 1 application topically 2 (two) times daily., Disp: 30 g, Rfl: 11 .  fluticasone (FLONASE) 50 MCG/ACT nasal spray, Place 2 sprays into both nostrils daily., Disp: 16 g, Rfl: 6 .  levocetirizine (XYZAL) 5 MG tablet, TAKE 1 TABLET BY MOUTH ONCE DAILY IN THE EVENING, Disp: 90 tablet, Rfl: 0 .  lisinopril-hydrochlorothiazide (ZESTORETIC) 20-12.5 MG tablet, Take 1 tablet by mouth once daily, Disp: 90 tablet, Rfl: 0 .  montelukast (SINGULAIR) 10 MG tablet, TAKE 1 TABLET BY MOUTH AT BEDTIME, Disp: 90 tablet, Rfl: 0 .  pantoprazole (PROTONIX) 40 MG tablet, Take 1 tablet (40 mg total) by mouth 2 (two) times daily before a meal. (Patient taking differently: Take 40 mg by mouth daily.), Disp: 60 tablet, Rfl: 5 .  pantoprazole (PROTONIX) 40 MG tablet, Take 1 tablet (40 mg total) by mouth 2 (two) times daily before a meal., Disp: 60 tablet, Rfl: 3 .  traZODone (DESYREL) 50 MG tablet, TAKE 1/2 TO 1 (ONE-HALF TO ONE) TABLET BY MOUTH  ONCE DAILY AT BEDTIME AS NEEDED FOR SLEEP, Disp: 90 tablet, Rfl: 1 .  vitamin B-12 (CYANOCOBALAMIN) 500 MCG tablet, Take 1,000 mcg by mouth in the morning and at bedtime., Disp: , Rfl:   Objective Blood pressure (!) 144/79, pulse 78, weight 184 lb (83.5 kg).  General well-developed well-nourished no acute distress She has normal external genitalia no evidence of yeast vulvovaginitis No significant erythema or plaque changes Normal exam  Pertinent ROS No burning with urination, frequency or urgency No nausea, vomiting or diarrhea Nor fever chills or other constitutional symptoms   Labs or studies No new    Impression Diagnoses this Encounter::   [id number]   1. 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 Candidal vulvovaginitis  B37.3    completely resolved    Established relevant diagnosis(es):   Plan/Recommendations: No orders of the defined types were placed in this encounter.   Labs or Scans Ordered: No orders of the defined types were placed in this encounter.   Management:: Continue Lidex-E twice daily until seasonal allergy season resolves and then decrease to nightly Patient understands that she will have occasional flares and will need to increase it to twice daily at certain times sometimes because of allergies sometimes for no apparent reason at all I will see her back in 6 months and if doing well probably decrease her to 3 times a week She have any difficulties tween now and then she will let me know  Follow up Return in about 6 months (around 04/16/2021) for Follow up, with Dr Elonda Husky.        All questions were answered.

## 2020-10-21 ENCOUNTER — Telehealth: Payer: Self-pay | Admitting: Obstetrics & Gynecology

## 2020-10-21 NOTE — Telephone Encounter (Signed)
Pt has a bump in the vaginal area, wonders if the steroid cream that Dr. Elonda Husky gave her for another condition  Please advise & call pt     [facility]

## 2020-10-22 NOTE — Telephone Encounter (Signed)
Pt states that she noticed a bump on her labia. It is not a boil and is not tender. She just wanted to let us know about it and see what she should do. Advised Dr. Elonda Husky out of the office but I would send a message to Anderson Malta to see what she recommends.

## 2020-10-22 NOTE — Telephone Encounter (Signed)
Attempted to call patient back heard message that vm is not set up.

## 2020-10-29 ENCOUNTER — Other Ambulatory Visit: Payer: Self-pay | Admitting: Family Medicine

## 2020-10-29 DIAGNOSIS — J309 Allergic rhinitis, unspecified: Secondary | ICD-10-CM

## 2020-11-11 ENCOUNTER — Ambulatory Visit (HOSPITAL_COMMUNITY)
Admission: RE | Admit: 2020-11-11 | Discharge: 2020-11-11 | Disposition: A | Discharge status: Home / Self Care | Payer: PPO | Source: Ambulatory Visit | Attending: Nurse Practitioner | Admitting: Nurse Practitioner

## 2020-11-11 ENCOUNTER — Ambulatory Visit (INDEPENDENT_AMBULATORY_CARE_PROVIDER_SITE_OTHER): Payer: PPO | Admitting: Nurse Practitioner

## 2020-11-11 ENCOUNTER — Other Ambulatory Visit: Payer: Self-pay

## 2020-11-11 VITALS — BP 130/80 | HR 70 | Temp 98.2°F | Ht 64.0 in | Wt 181.0 lb

## 2020-11-11 DIAGNOSIS — R0781 Pleurodynia: Secondary | ICD-10-CM | POA: Diagnosis not present

## 2020-11-11 DIAGNOSIS — M47814 Spondylosis without myelopathy or radiculopathy, thoracic region: Secondary | ICD-10-CM | POA: Diagnosis not present

## 2020-11-11 MED ORDER — DICLOFENAC SODIUM 1 % EX GEL: 4.0000 g | Freq: Four times a day (QID) | CUTANEOUS | 0 refills | Status: AC

## 2020-11-11 NOTE — Progress Notes (Signed)
Subjective:    Patient ID: Jill Roach, female    DOB: 04-28-1951, 70 y.o.   MRN: [id number]  HPI: Jill Roach is a 70 y.o. female presenting for fall.  Chief Complaint  Patient presents with  . Fall    Tripped over box, fell on right side 1 wk ago. Taking no meds for the pain   FALL Duration: 8 days ago Location: fell on right side; right side of chest is hurting Onset of pain: with certain movements Severity: severe Quality: extremely sore Alleviating factors: time, nothing tried Aggravating factors: certain movements Fever: no Decreased range of motion: no, some pain with certain movements Treatments attempted: nothing  Allergies  Allergen Reactions  . Codeine Nausea Only    Outpatient Encounter Medications as of 11/11/2020  Medication Sig  . diclofenac Sodium (VOLTAREN) 1 % GEL Apply 4 g topically 4 (four) times daily.  [name] 88 MCG tablet Take 1 tablet by mouth once daily  . fluocinonide-emollient (LIDEX-E) 0.05 % cream Apply 1 application topically 2 (two) times daily.  . fluticasone (FLONASE) 50 MCG/ACT nasal spray Place 2 sprays into both nostrils daily.  Marland Kitchen levocetirizine (XYZAL) 5 MG tablet TAKE 1 TABLET BY MOUTH ONCE DAILY IN THE EVENING  . lisinopril-hydrochlorothiazide (ZESTORETIC) 20-12.5 MG tablet Take 1 tablet by mouth once daily  . montelukast (SINGULAIR) 10 MG tablet TAKE 1 TABLET BY MOUTH AT BEDTIME  . pantoprazole (PROTONIX) 40 MG tablet Take 1 tablet (40 mg total) by mouth 2 (two) times daily before a meal. (Patient taking differently: Take 40 mg by mouth daily.)  . pantoprazole (PROTONIX) 40 MG tablet Take 1 tablet (40 mg total) by mouth 2 (two) times daily before a meal.  . traZODone (DESYREL) 50 MG tablet TAKE 1/2 TO 1 (ONE-HALF TO ONE) TABLET BY MOUTH ONCE DAILY AT BEDTIME AS NEEDED FOR SLEEP  . vitamin B-12 (CYANOCOBALAMIN) 500 MCG tablet Take 1,000 mcg by mouth in the morning and at bedtime.  Marland Kitchen SHINGRIX injection    No  facility-administered encounter medications on file as of 11/11/2020.    Patient Active Problem List   Diagnosis Date Noted  . Perineal itching, female 05/28/2020  . B12 deficiency 11/15/2019  . GERD (gastroesophageal reflux disease) 10/02/2019  . Constipation 10/02/2019  . DDD (degenerative disc disease), lumbosacral 04/25/2019  . Reflux esophagitis 02/04/2017  . Colon cancer screening 10/05/2016  . Globus sensation 10/05/2016  . Osteopenia 09/28/2016  . Obesity 03/24/2016  . Chronic insomnia 03/24/2016  . Arthritis 09/25/2015  . Hypertension   . Hyperlipidemia   . Hypothyroidism     Past Medical History:  Diagnosis Date  . Allergy   . Cataract   . GERD (gastroesophageal reflux disease)   . Hyperlipidemia   . Hypertension   . PONV (postoperative nausea and vomiting)   . Thyroid disease     Relevant past medical, surgical, family and social history reviewed and updated as indicated. Interim medical history since our last visit reviewed.  Review of Systems Per HPI unless specifically indicated above     Objective:    BP 130/80   Pulse 70   Temp 98.2 F (36.8 C)   Ht 5\' 4"  (1.626 m)   Wt 181 lb (82.1 kg)   SpO2 97%   BMI 31.07 kg/m   Wt Readings from Last 3 Encounters:  11/11/20 181 lb (82.1 kg)  10/14/20 184 lb (83.5 kg)  09/25/20 183 lb 6.4 oz (83.2 kg)    Physical  Exam Vitals and nursing note reviewed.  Constitutional:      General: She is not in acute distress.    Appearance: Normal appearance. She is obese. She is not toxic-appearing.  HENT:     Head: Normocephalic and atraumatic.  Eyes:     General: No scleral icterus.    Extraocular Movements: Extraocular movements intact.  Pulmonary:     Effort: Pulmonary effort is normal. No respiratory distress.  Chest:    Abdominal:     General: Abdomen is flat.     Palpations: Abdomen is soft.  Musculoskeletal:        General: Normal range of motion.     Right lower leg: No edema.     Left lower leg:  No edema.  Skin:    General: Skin is warm and dry.     Capillary Refill: Capillary refill takes less than 2 seconds.     Coloration: Skin is not jaundiced or pale.     Findings: No bruising or erythema.  Neurological:     Mental Status: She is alert and oriented to person, place, and time.     Motor: No weakness.     Gait: Gait normal.  Psychiatric:        Mood and Affect: Mood normal.        Behavior: Behavior normal.        Thought Content: Thought content normal.        Judgment: Judgment normal.        Assessment & Plan:  1. Rib pain on right side Acute x 8 days since fall on right side.  Question rib contusion vs. Possible costochondritis.  Will obtain x-ray imaging per patient request.  Start Voltaren gel along with Tylenol/ice to help decrease inflammation.  Follow up pending x-ray results.  - DG Ribs Unilateral Right; Future - diclofenac Sodium (VOLTAREN) 1 % GEL; Apply 4 g topically 4 (four) times daily.  Dispense: 50 g; Refill: 0     Follow up plan: Return if symptoms worsen or fail to improve.

## 2020-12-05 DIAGNOSIS — K219 Gastro-esophageal reflux disease without esophagitis: Secondary | ICD-10-CM | POA: Diagnosis not present

## 2020-12-05 DIAGNOSIS — J0101 Acute recurrent maxillary sinusitis: Secondary | ICD-10-CM | POA: Diagnosis not present

## 2020-12-05 DIAGNOSIS — G4709 Other insomnia: Secondary | ICD-10-CM | POA: Diagnosis not present

## 2020-12-05 DIAGNOSIS — H81319 Aural vertigo, unspecified ear: Secondary | ICD-10-CM | POA: Diagnosis not present

## 2020-12-05 DIAGNOSIS — H8110 Benign paroxysmal vertigo, unspecified ear: Secondary | ICD-10-CM | POA: Diagnosis not present

## 2020-12-05 DIAGNOSIS — R0982 Postnasal drip: Secondary | ICD-10-CM | POA: Diagnosis not present

## 2020-12-05 DIAGNOSIS — Z0189 Encounter for other specified special examinations: Secondary | ICD-10-CM | POA: Diagnosis not present

## 2020-12-05 DIAGNOSIS — E039 Hypothyroidism, unspecified: Secondary | ICD-10-CM | POA: Diagnosis not present

## 2020-12-05 DIAGNOSIS — I10 Essential (primary) hypertension: Secondary | ICD-10-CM | POA: Diagnosis not present

## 2020-12-05 DIAGNOSIS — L900 Lichen sclerosus et atrophicus: Secondary | ICD-10-CM | POA: Diagnosis not present

## 2020-12-05 DIAGNOSIS — D519 Vitamin B12 deficiency anemia, unspecified: Secondary | ICD-10-CM | POA: Diagnosis not present

## 2020-12-05 DIAGNOSIS — J309 Allergic rhinitis, unspecified: Secondary | ICD-10-CM | POA: Diagnosis not present

## 2020-12-10 DIAGNOSIS — Z0000 Encounter for general adult medical examination without abnormal findings: Secondary | ICD-10-CM | POA: Diagnosis not present

## 2020-12-16 ENCOUNTER — Telehealth: Payer: Self-pay | Admitting: Obstetrics & Gynecology

## 2020-12-16 NOTE — Telephone Encounter (Signed)
Patient wants to know if she go to the beach and get in the water with her Lichen sclerosus condition, per patient. Clinical staff will follow up with patient.

## 2020-12-17 ENCOUNTER — Telehealth: Payer: Self-pay | Admitting: Obstetrics & Gynecology

## 2020-12-17 NOTE — Telephone Encounter (Signed)
Pt advised she can get in ocean or swimming pool but not to leave a wet bathing suit on for a long period of time if possible. Pt voiced understanding. [place]

## 2020-12-17 NOTE — Telephone Encounter (Signed)
Patient has a question about getting into the swimming pool.

## 2020-12-17 NOTE — Telephone Encounter (Signed)
Left message @ 4:33 pm letting pt know salt water is healthy but try not to sit in a wet bathing suit for long periods if possible. [place]

## 2020-12-17 NOTE — Telephone Encounter (Signed)
That is fine salt water is healthy but try to not to sit for long periods ina wet bathing suit if possible  Dr Elonda Husky

## 2020-12-19 ENCOUNTER — Other Ambulatory Visit (HOSPITAL_COMMUNITY): Payer: Self-pay | Admitting: Family Medicine

## 2020-12-19 DIAGNOSIS — Z0001 Encounter for general adult medical examination with abnormal findings: Secondary | ICD-10-CM | POA: Diagnosis not present

## 2020-12-19 DIAGNOSIS — E782 Mixed hyperlipidemia: Secondary | ICD-10-CM | POA: Diagnosis not present

## 2020-12-19 DIAGNOSIS — J309 Allergic rhinitis, unspecified: Secondary | ICD-10-CM | POA: Diagnosis not present

## 2020-12-19 DIAGNOSIS — Z13820 Encounter for screening for osteoporosis: Secondary | ICD-10-CM

## 2020-12-19 DIAGNOSIS — K219 Gastro-esophageal reflux disease without esophagitis: Secondary | ICD-10-CM | POA: Diagnosis not present

## 2020-12-19 DIAGNOSIS — R7301 Impaired fasting glucose: Secondary | ICD-10-CM | POA: Diagnosis not present

## 2020-12-19 DIAGNOSIS — R7303 Prediabetes: Secondary | ICD-10-CM | POA: Diagnosis not present

## 2020-12-19 DIAGNOSIS — E559 Vitamin D deficiency, unspecified: Secondary | ICD-10-CM | POA: Diagnosis not present

## 2020-12-19 DIAGNOSIS — G4700 Insomnia, unspecified: Secondary | ICD-10-CM | POA: Diagnosis not present

## 2020-12-19 DIAGNOSIS — E039 Hypothyroidism, unspecified: Secondary | ICD-10-CM | POA: Diagnosis not present

## 2020-12-19 DIAGNOSIS — E538 Deficiency of other specified B group vitamins: Secondary | ICD-10-CM | POA: Diagnosis not present

## 2020-12-19 DIAGNOSIS — R42 Dizziness and giddiness: Secondary | ICD-10-CM | POA: Diagnosis not present

## 2020-12-19 DIAGNOSIS — I10 Essential (primary) hypertension: Secondary | ICD-10-CM | POA: Diagnosis not present

## 2021-01-13 ENCOUNTER — Other Ambulatory Visit (HOSPITAL_COMMUNITY): Payer: Self-pay | Admitting: Family Medicine

## 2021-01-13 ENCOUNTER — Other Ambulatory Visit (HOSPITAL_COMMUNITY): Payer: Self-pay | Admitting: Internal Medicine

## 2021-01-13 DIAGNOSIS — Z1231 Encounter for screening mammogram for malignant neoplasm of breast: Secondary | ICD-10-CM

## 2021-01-20 ENCOUNTER — Other Ambulatory Visit: Payer: Self-pay

## 2021-01-20 ENCOUNTER — Ambulatory Visit (HOSPITAL_COMMUNITY)
Admission: RE | Admit: 2021-01-20 | Discharge: 2021-01-20 | Disposition: A | Discharge status: Home / Self Care | Payer: PPO | Source: Ambulatory Visit | Attending: Family Medicine | Admitting: Family Medicine

## 2021-01-20 DIAGNOSIS — Z1231 Encounter for screening mammogram for malignant neoplasm of breast: Secondary | ICD-10-CM | POA: Insufficient documentation

## 2021-01-20 DIAGNOSIS — Z13820 Encounter for screening for osteoporosis: Secondary | ICD-10-CM

## 2021-01-20 DIAGNOSIS — M85851 Other specified disorders of bone density and structure, right thigh: Secondary | ICD-10-CM | POA: Diagnosis not present

## 2021-01-20 DIAGNOSIS — E559 Vitamin D deficiency, unspecified: Secondary | ICD-10-CM | POA: Insufficient documentation

## 2021-01-20 DIAGNOSIS — Z780 Asymptomatic menopausal state: Secondary | ICD-10-CM | POA: Diagnosis not present

## 2021-01-22 ENCOUNTER — Other Ambulatory Visit (HOSPITAL_COMMUNITY): Payer: Self-pay | Admitting: Family Medicine

## 2021-01-23 ENCOUNTER — Other Ambulatory Visit (HOSPITAL_COMMUNITY): Payer: Self-pay | Admitting: Family Medicine

## 2021-01-29 ENCOUNTER — Other Ambulatory Visit (HOSPITAL_COMMUNITY): Payer: Self-pay | Admitting: Family Medicine

## 2021-01-29 DIAGNOSIS — R928 Other abnormal and inconclusive findings on diagnostic imaging of breast: Secondary | ICD-10-CM

## 2021-01-31 ENCOUNTER — Other Ambulatory Visit: Payer: Self-pay | Admitting: *Deleted

## 2021-01-31 MED ORDER — LEVOTHYROXINE SODIUM 88 MCG PO TABS: 88.0000 ug | ORAL_TABLET | Freq: Every day | ORAL | 0 refills | Status: DC

## 2021-02-04 ENCOUNTER — Other Ambulatory Visit: Payer: Self-pay | Admitting: *Deleted

## 2021-02-04 DIAGNOSIS — J309 Allergic rhinitis, unspecified: Secondary | ICD-10-CM

## 2021-02-04 MED ORDER — MONTELUKAST SODIUM 10 MG PO TABS: 10.0000 mg | ORAL_TABLET | Freq: Every day | ORAL | 0 refills | Status: AC

## 2021-02-04 MED ORDER — LEVOCETIRIZINE DIHYDROCHLORIDE 5 MG PO TABS: 5.0000 mg | ORAL_TABLET | Freq: Every evening | ORAL | 0 refills | Status: AC

## 2021-02-04 MED ORDER — LISINOPRIL-HYDROCHLOROTHIAZIDE 20-12.5 MG PO TABS: 1.0000 | ORAL_TABLET | Freq: Every day | ORAL | 0 refills | Status: AC

## 2021-02-12 ENCOUNTER — Other Ambulatory Visit (HOSPITAL_COMMUNITY): Payer: Self-pay | Admitting: Family Medicine

## 2021-02-12 ENCOUNTER — Ambulatory Visit (HOSPITAL_COMMUNITY)
Admission: RE | Admit: 2021-02-12 | Discharge: 2021-02-12 | Disposition: A | Discharge status: Home / Self Care | Payer: PPO | Source: Ambulatory Visit | Attending: Family Medicine | Admitting: Family Medicine

## 2021-02-12 ENCOUNTER — Other Ambulatory Visit: Payer: Self-pay

## 2021-02-12 DIAGNOSIS — R928 Other abnormal and inconclusive findings on diagnostic imaging of breast: Secondary | ICD-10-CM

## 2021-02-12 DIAGNOSIS — R921 Mammographic calcification found on diagnostic imaging of breast: Secondary | ICD-10-CM

## 2021-02-12 DIAGNOSIS — R922 Inconclusive mammogram: Secondary | ICD-10-CM | POA: Diagnosis not present

## 2021-02-21 ENCOUNTER — Other Ambulatory Visit: Payer: Self-pay

## 2021-02-21 ENCOUNTER — Ambulatory Visit
Admission: RE | Admit: 2021-02-21 | Discharge: 2021-02-21 | Disposition: A | Discharge status: Home / Self Care | Payer: PPO | Source: Ambulatory Visit | Attending: Family Medicine | Admitting: Family Medicine

## 2021-02-21 DIAGNOSIS — N6011 Diffuse cystic mastopathy of right breast: Secondary | ICD-10-CM | POA: Diagnosis not present

## 2021-02-21 DIAGNOSIS — R928 Other abnormal and inconclusive findings on diagnostic imaging of breast: Secondary | ICD-10-CM

## 2021-02-21 DIAGNOSIS — R921 Mammographic calcification found on diagnostic imaging of breast: Secondary | ICD-10-CM

## 2021-02-21 HISTORY — PX: BREAST BIOPSY: SHX20

## 2021-03-02 ENCOUNTER — Other Ambulatory Visit: Payer: Self-pay | Admitting: Family Medicine

## 2021-03-05 ENCOUNTER — Other Ambulatory Visit: Payer: Self-pay | Admitting: *Deleted

## 2021-03-05 MED ORDER — TRAZODONE HCL 50 MG PO TABS: 25.0000 mg | ORAL_TABLET | Freq: Every evening | ORAL | 0 refills | Status: DC | PRN

## 2021-03-10 ENCOUNTER — Encounter: Payer: Self-pay | Admitting: Internal Medicine

## 2021-03-21 ENCOUNTER — Encounter: Payer: Self-pay | Admitting: Obstetrics & Gynecology

## 2021-03-21 ENCOUNTER — Ambulatory Visit: Payer: PPO | Admitting: Obstetrics & Gynecology

## 2021-03-21 ENCOUNTER — Other Ambulatory Visit: Payer: Self-pay

## 2021-03-21 VITALS — BP 138/79 | HR 74 | Ht 64.0 in | Wt 179.0 lb

## 2021-03-21 DIAGNOSIS — L900 Lichen sclerosus et atrophicus: Secondary | ICD-10-CM | POA: Diagnosis not present

## 2021-03-21 MED ORDER — FLUOCINONIDE EMULSIFIED BASE 0.05 % EX CREA: 1.0000 "application " | TOPICAL_CREAM | Freq: Two times a day (BID) | CUTANEOUS | 11 refills | Status: DC

## 2021-03-21 NOTE — Progress Notes (Signed)
Chief Complaint  Patient presents with   Follow-up    Lichen sclerosus      70 y.o. [id number] No LMP recorded. Patient is postmenopausal. The current method of family planning is menopause.  Outpatient Encounter Medications as of 03/21/2021  Medication Sig   fluticasone (FLONASE) 50 MCG/ACT nasal spray Place 2 sprays into both nostrils daily.   levocetirizine (XYZAL) 5 MG tablet Take 1 tablet (5 mg total) by mouth every evening.   levothyroxine (SYNTHROID) 88 MCG tablet Take 1 tablet by mouth once daily   lisinopril-hydrochlorothiazide (ZESTORETIC) 20-12.5 MG tablet Take 1 tablet by mouth daily.   meclizine (ANTIVERT) 25 MG tablet Take 25 mg by mouth 3 (three) times daily as needed for dizziness.   montelukast (SINGULAIR) 10 MG tablet Take 1 tablet (10 mg total) by mouth at bedtime.   pantoprazole (PROTONIX) 40 MG tablet Take 1 tablet (40 mg total) by mouth 2 (two) times daily before a meal.   traZODone (DESYREL) 50 MG tablet Take 0.5-1 tablets (25-50 mg total) by mouth at bedtime as needed for sleep.   vitamin B-12 (CYANOCOBALAMIN) 500 MCG tablet Take 1,000 mcg by mouth in the morning and at bedtime.   [DISCONTINUED] fluocinonide-emollient (LIDEX-E) 0.05 % cream Apply 1 application topically 2 (two) times daily.   diclofenac Sodium (VOLTAREN) 1 % GEL Apply 4 g topically 4 (four) times daily. (Patient not taking: Reported on 03/21/2021)   fluocinonide-emollient (LIDEX-E) 0.05 % cream Apply 1 application topically 2 (two) times daily.   pantoprazole (PROTONIX) 40 MG tablet Take 1 tablet (40 mg total) by mouth 2 (two) times daily before a meal. (Patient not taking: Reported on 03/21/2021)   No facility-administered encounter medications on file as of 03/21/2021.    Subjective Pt is in for follow up of her LSA Overall doing well Has flared a bit here during the summer heat and beachgoing Recommend increase interval to twice daily during flares and then go back to daily once  subsides Past Medical History:  Diagnosis Date   Allergy    Cataract    GERD (gastroesophageal reflux disease)    Hyperlipidemia    Hypertension    PONV (postoperative nausea and vomiting)    Thyroid disease     Past Surgical History:  Procedure Laterality Date   ambulatory esophageal 24 hour pH study  2004   within normal range. Dr. Johnathan Hausen   COLONOSCOPY N/A 11/04/2016   diverticulosis, otherwise normal. Repeat in 2028.    ESOPHAGEAL MANOMETRY  2004   Dr. Johnathan Hausen: normal   ESOPHAGOGASTRODUODENOSCOPY  2003   Dr. Johnathan Hausen: normal    ESOPHAGOGASTRODUODENOSCOPY N/A 11/04/2016   LA grade B esophagitis s/p dilation, normal stomach and duodenum   EYE SURGERY     cataracts   FRACTURE SURGERY     cataract surgery    LIGAMENT REPAIR     rt knee   MALONEY DILATION N/A 11/04/2016   Procedure: Venia Minks DILATION;  Surgeon: Daneil Dolin, MD;  Location: [facility];  Service: Endoscopy;  Laterality: N/A;   TUBAL LIGATION      OB History     Gravida  3   Para  3   Term  3   Preterm      AB      Living  3      SAB      IAB      Ectopic      Multiple  Live Births  3           Allergies  Allergen Reactions   Codeine Nausea Only    Social History   Socioeconomic History   Marital status: Divorced    Spouse name: Not on file   Number of children: Not on file   Years of education: Not on file   Highest education level: Not on file  Occupational History   Not on file  Tobacco Use   Smoking status: Never   Smokeless tobacco: Never  Vaping Use   Vaping Use: Never used  Substance and Sexual Activity   Alcohol use: No   Drug use: No   Sexual activity: Not Currently  Other Topics Concern   Not on file  Social History Narrative   Not on file   Social Determinants of Health   Financial Resource Strain: Not on file  Food Insecurity: Not on file  Transportation Needs: Not on file  Physical Activity: Not on file  Stress: Not  on file  Social Connections: Not on file    Family History  Problem Relation Age of Onset   Hypertension Mother    Arthritis Father        Rheumatoid    Diabetes Father    Hypertension Father    Thyroid disease Father    Arthritis Brother        Rheumatoid   Thyroid disease Brother    Heart disease Maternal Grandmother        stomach cancer   Hypertension Maternal Grandmother    Cancer Paternal Grandmother        LEUKEMIA/LUNG CNACER,   Thyroid disease Paternal Grandmother    Hypertension Paternal Grandmother    Cancer Other    Mental illness Other    Cancer Maternal Uncle    Thyroid disease Paternal Aunt    Colon cancer Neg Hx     Medications:       Current Outpatient Medications:    fluticasone (FLONASE) 50 MCG/ACT nasal spray, Place 2 sprays into both nostrils daily., Disp: 16 g, Rfl: 6   levocetirizine (XYZAL) 5 MG tablet, Take 1 tablet (5 mg total) by mouth every evening., Disp: 90 tablet, Rfl: 0   levothyroxine (SYNTHROID) 88 MCG tablet, Take 1 tablet by mouth once daily, Disp: 90 tablet, Rfl: 0   lisinopril-hydrochlorothiazide (ZESTORETIC) 20-12.5 MG tablet, Take 1 tablet by mouth daily., Disp: 90 tablet, Rfl: 0   meclizine (ANTIVERT) 25 MG tablet, Take 25 mg by mouth 3 (three) times daily as needed for dizziness., Disp: , Rfl:    montelukast (SINGULAIR) 10 MG tablet, Take 1 tablet (10 mg total) by mouth at bedtime., Disp: 90 tablet, Rfl: 0   pantoprazole (PROTONIX) 40 MG tablet, Take 1 tablet (40 mg total) by mouth 2 (two) times daily before a meal., Disp: 60 tablet, Rfl: 3   traZODone (DESYREL) 50 MG tablet, Take 0.5-1 tablets (25-50 mg total) by mouth at bedtime as needed for sleep., Disp: 45 tablet, Rfl: 0   vitamin B-12 (CYANOCOBALAMIN) 500 MCG tablet, Take 1,000 mcg by mouth in the morning and at bedtime., Disp: , Rfl:    diclofenac Sodium (VOLTAREN) 1 % GEL, Apply 4 g topically 4 (four) times daily. (Patient not taking: Reported on 03/21/2021), Disp: 50 g, Rfl:  0   fluocinonide-emollient (LIDEX-E) 0.05 % cream, Apply 1 application topically 2 (two) times daily., Disp: 30 g, Rfl: 11   pantoprazole (PROTONIX) 40 MG tablet, Take 1 tablet (40 mg total)  by mouth 2 (two) times daily before a meal. (Patient not taking: Reported on 03/21/2021), Disp: 60 tablet, Rfl: 5  Objective Blood pressure 138/79, pulse 74, height '5\' 4"'$  (1.626 m), weight 179 lb (81.2 kg).  General WDWN female NAD Vulva:  normal appearing vulva with no masses, tenderness or lesions Vagina:  normal mucosa, no discharge No evidence of yeast   Pertinent ROS No burning with urination, frequency or urgency No nausea, vomiting or diarrhea Nor fever chills or other constitutional symptoms   Labs or studies     Impression Diagnoses this Encounter::   0000000   1. Lichen sclerosus et atrophicus, diagnosed 11/21  L90.0    stable, flared a bit with the summer heat      Established relevant diagnosis(es):   Plan/Recommendations: Meds ordered this encounter  Medications   fluocinonide-emollient (LIDEX-E) 0.05 % cream    Sig: Apply 1 application topically 2 (two) times daily.    Dispense:  30 g    Refill:  11    Labs or Scans Ordered: No orders of the defined types were placed in this encounter.   Management:: Increase to twice daily during flares then back down to daily  Follow up Return in about 1 year (around 03/21/2022) for Follow up, with Dr Elonda Husky.      All questions were answered.

## 2021-04-22 ENCOUNTER — Encounter: Payer: Self-pay | Admitting: Family Medicine

## 2021-04-24 ENCOUNTER — Other Ambulatory Visit: Payer: Self-pay | Admitting: Family Medicine

## 2021-04-29 DIAGNOSIS — H8112 Benign paroxysmal vertigo, left ear: Secondary | ICD-10-CM | POA: Diagnosis not present

## 2021-04-29 DIAGNOSIS — H903 Sensorineural hearing loss, bilateral: Secondary | ICD-10-CM | POA: Diagnosis not present

## 2021-04-29 DIAGNOSIS — R42 Dizziness and giddiness: Secondary | ICD-10-CM | POA: Insufficient documentation

## 2021-04-30 DIAGNOSIS — H903 Sensorineural hearing loss, bilateral: Secondary | ICD-10-CM | POA: Insufficient documentation

## 2021-06-07 DIAGNOSIS — J069 Acute upper respiratory infection, unspecified: Secondary | ICD-10-CM | POA: Diagnosis not present

## 2021-06-10 ENCOUNTER — Other Ambulatory Visit: Payer: Self-pay | Admitting: Family Medicine

## 2021-06-17 DIAGNOSIS — J019 Acute sinusitis, unspecified: Secondary | ICD-10-CM | POA: Diagnosis not present

## 2021-07-07 ENCOUNTER — Other Ambulatory Visit: Payer: Self-pay | Admitting: Orthopedic Surgery

## 2021-07-07 DIAGNOSIS — G8929 Other chronic pain: Secondary | ICD-10-CM

## 2021-08-06 ENCOUNTER — Other Ambulatory Visit: Payer: Self-pay | Admitting: Nurse Practitioner

## 2021-08-19 ENCOUNTER — Other Ambulatory Visit: Payer: Self-pay | Admitting: Gastroenterology

## 2021-08-26 ENCOUNTER — Telehealth: Payer: Self-pay

## 2021-08-26 ENCOUNTER — Other Ambulatory Visit: Payer: Self-pay | Admitting: Orthopedic Surgery

## 2021-08-26 MED ORDER — PREDNISONE 10 MG (48) PO TBPK: ORAL_TABLET | Freq: Every day | ORAL | 0 refills | Status: DC

## 2021-08-26 NOTE — Progress Notes (Signed)
Meds ordered this encounter  Medications   DISCONTD: predniSONE (STERAPRED UNI-PAK 48 TAB) 10 MG (48) TBPK tablet    Sig: Take by mouth daily.    Dispense:  48 tablet    Refill:  0   predniSONE (STERAPRED UNI-PAK 48 TAB) 10 MG (48) TBPK tablet    Sig: Take by mouth daily. 10 mg DS 12 days    Dispense:  48 tablet    Refill:  0

## 2021-08-26 NOTE — Telephone Encounter (Signed)
Patient called into the office and would like to know if she can have a rx for prednisone sent in for her back ? She stated if this didn't work she would make an apt but she wanted to ask first.

## 2021-09-01 ENCOUNTER — Other Ambulatory Visit: Payer: Self-pay | Admitting: Nurse Practitioner

## 2021-09-03 ENCOUNTER — Telehealth: Payer: Self-pay

## 2021-09-03 NOTE — Telephone Encounter (Signed)
Help, yeats infection ??

## 2021-09-03 NOTE — Telephone Encounter (Signed)
Patient left message on voicemail stating that you had sent her in a script for Prednisone 12 day supply, and when she takes it for a long period of time like that she gets a yeast infection. She is asking if you will call her in something for the yeast infection. Please advise  PATIENT USES [facility]

## 2021-09-04 ENCOUNTER — Other Ambulatory Visit: Payer: Self-pay | Admitting: Orthopedic Surgery

## 2021-09-04 MED ORDER — FLUCONAZOLE 150 MG PO TABS: 150.0000 mg | ORAL_TABLET | Freq: Every day | ORAL | 0 refills | Status: AC

## 2021-09-16 ENCOUNTER — Other Ambulatory Visit: Payer: Self-pay

## 2021-09-16 ENCOUNTER — Other Ambulatory Visit (HOSPITAL_COMMUNITY): Payer: Self-pay | Admitting: Family Medicine

## 2021-09-16 ENCOUNTER — Ambulatory Visit (HOSPITAL_COMMUNITY)
Admission: RE | Admit: 2021-09-16 | Discharge: 2021-09-16 | Disposition: A | Discharge status: Home / Self Care | Payer: PPO | Source: Ambulatory Visit | Attending: Family Medicine | Admitting: Family Medicine

## 2021-09-16 DIAGNOSIS — K219 Gastro-esophageal reflux disease without esophagitis: Secondary | ICD-10-CM | POA: Diagnosis not present

## 2021-09-16 DIAGNOSIS — E039 Hypothyroidism, unspecified: Secondary | ICD-10-CM | POA: Diagnosis not present

## 2021-09-16 DIAGNOSIS — G4700 Insomnia, unspecified: Secondary | ICD-10-CM | POA: Diagnosis not present

## 2021-09-16 DIAGNOSIS — M5442 Lumbago with sciatica, left side: Secondary | ICD-10-CM

## 2021-09-16 DIAGNOSIS — E538 Deficiency of other specified B group vitamins: Secondary | ICD-10-CM | POA: Diagnosis not present

## 2021-09-16 DIAGNOSIS — R42 Dizziness and giddiness: Secondary | ICD-10-CM | POA: Diagnosis not present

## 2021-09-16 DIAGNOSIS — I10 Essential (primary) hypertension: Secondary | ICD-10-CM | POA: Diagnosis not present

## 2021-09-16 DIAGNOSIS — J309 Allergic rhinitis, unspecified: Secondary | ICD-10-CM | POA: Diagnosis not present

## 2021-09-16 DIAGNOSIS — E782 Mixed hyperlipidemia: Secondary | ICD-10-CM | POA: Diagnosis not present

## 2021-09-17 ENCOUNTER — Telehealth: Payer: Self-pay | Admitting: Orthopedic Surgery

## 2021-09-17 NOTE — Telephone Encounter (Signed)
Patient called to confirm her appt for tomorrow which she scheduled online. Patient also  states she seen her primary doctor yesterday Dr. Wende Neighbors and they gave her 2 injections in her back.  She still wants to be seen by Dr. Aline Brochure tomorrow.    I advised her we need the office notes from her visit yesterday and what type injections they gave her. They also sent her to [facility] to have X-rays done.  She did go yesterday evening had did the xray.    She is hurting so bad right now she can't stand it.

## 2021-09-17 NOTE — Telephone Encounter (Signed)
Ok

## 2021-09-18 ENCOUNTER — Other Ambulatory Visit: Payer: Self-pay

## 2021-09-18 ENCOUNTER — Ambulatory Visit: Payer: PPO | Admitting: Orthopedic Surgery

## 2021-09-18 DIAGNOSIS — G8929 Other chronic pain: Secondary | ICD-10-CM

## 2021-09-18 DIAGNOSIS — M545 Low back pain, unspecified: Secondary | ICD-10-CM

## 2021-09-18 MED ORDER — GABAPENTIN 100 MG PO CAPS: 100.0000 mg | ORAL_CAPSULE | Freq: Three times a day (TID) | ORAL | 0 refills | Status: AC

## 2021-09-18 NOTE — Patient Instructions (Addendum)
Take the Gabapentin three times daily for now  While we are working on your approval for MRI please go ahead and call to schedule your appointment with [facility] within at least one (1) week.  [facility] Scheduling [phone]

## 2021-09-18 NOTE — Progress Notes (Signed)
Chief Complaint  Patient presents with   Back Pain    LBP/not improving even with prednisone   Had x-rays   H/O chronic pain in her lower back   71 year old female seen for back pain and called in some prednisone Dosepaks in the past on this occasion the pain in her lower back and tailbone radiates to her left buttock has not improved after taking gabapentin prednisone Toradol injection Depo-Medrol injection  Repeat x-rays show worsening of her lumbar disc disease  Her exam is consistent with tenderness in the tailbone and left buttock her straight leg raises were equivocal  Recommend 100 mg of gabapentin 3 times a day  MRI lumbar spine to see if epidural injections will help  I will call the patient with the results and further treatment recommendations  Meds ordered this encounter  Medications   gabapentin (NEURONTIN) 100 MG capsule    Sig: Take 1 capsule (100 mg total) by mouth 3 (three) times daily.    Dispense:  90 capsule    Refill:  0

## 2021-09-29 ENCOUNTER — Ambulatory Visit: Payer: PPO | Admitting: Orthopedic Surgery

## 2021-09-30 ENCOUNTER — Telehealth: Payer: Self-pay

## 2021-09-30 NOTE — Telephone Encounter (Signed)
MRI Spine  Lumbar WO IV Contrast  Anatomical Region Laterality Modality  T-spine -- Magnetic Resonance  L-spine -- --  Pelvis -- --   Impression  IMPRESSION:  1.  Multilevel degenerative changes of the lumbar spine, as detailed above.  2.  Central stenosis is most pronounced and mild at L4-L5.  3.  Foraminal stenosis is most pronounced and mild on the left at L5-S1.  4.  Disc herniations at L1-L2, L4-L5, and L5-S1.   Electronically Signed by: Salvadore Oxford on 09/25/2021 2:26 PM Narrative  INDICATION: Low back pain, unspecified.  Low back pain. No injury.   COMPARISON: None.     TECHNIQUE:  Multiplanar, multisequence MR imaging of the lumbar spine (MRI SPINE LUMBAR WO IV CONTRAST) was performed (contrast: none).   FINDINGS:   Bones:  #  No acute fracture.  #  No suspicious osseous lesions.   ALIGNMENT:  #  Levoconvex curvature at L3-L4.   DISC LEVELS:   T12-L1: No significant central or foraminal stenosis.   L1-L2: Mild degenerative disc disease. Small left paracentral disc extrusion with disc material extending along the left posterior aspect of L2. Minimal indentation of the left ventral thecal sac. No significant foraminal stenosis.   L2-L3: Mild degenerative disc disease. Mild disc bulge and endplate spurring. No significant central or foraminal stenosis.     L3-L4: Mild degenerative disc disease. Mild disc bulge and endplate spurring extending into the foramen. Mild facet degenerative changes. Minimal central stenosis. No significant foraminal stenosis.   L4-L5: Mild degenerative disc disease. Shallow central disc extrusion extending along the posterior aspect of L4. Mild facet degenerative changes/ligamentum flavum thickening. Mild central stenosis. Mild lateral recess stenosis. No significant foraminal stenosis.     L5-S1: Mild degenerative disc disease. Mild facet degenerative changes. Diffuse disc bulge and endplate spurring. Small left foraminal disc protrusion.  Mild left foraminal stenosis. Mild left lateral recess stenosis. No significant central stenosis.   SPINAL CORD:  No abnormal signal is demonstrated within the visualized distal spinal cord. Cauda equina appears unremarkable. Conus medullaris terminates at L1.   PARASPINAL TISSUES:  Intact.   VISUALIZED SACRUM AND LOWER THORACIC SPINE:  #  Mild sacroiliac joint degenerative changes.  #  Tarlov cysts in the sacrum.  #  Degenerative disc disease at T11-T12.   INCIDENTAL:  #  Colonic diverticulosis.  #  Small fat-containing umbilical hernia. Procedure Note  Salvadore Oxford, MD - 09/25/2021  Formatting of this note might be different from the original.  INDICATION: Low back pain, unspecified.  Low back pain. No injury.   COMPARISON: None.     TECHNIQUE:  Multiplanar, multisequence MR imaging of the lumbar spine (MRI SPINE LUMBAR WO IV CONTRAST) was performed (contrast: none).   FINDINGS:   Bones:  #  No acute fracture.  #  No suspicious osseous lesions.   ALIGNMENT:  #  Levoconvex curvature at L3-L4.   DISC LEVELS:   T12-L1: No significant central or foraminal stenosis.   L1-L2: Mild degenerative disc disease. Small left paracentral disc extrusion with disc material extending along the left posterior aspect of L2. Minimal indentation of the left ventral thecal sac. No significant foraminal stenosis.   L2-L3: Mild degenerative disc disease. Mild disc bulge and endplate spurring. No significant central or foraminal stenosis.     L3-L4: Mild degenerative disc disease. Mild disc bulge and endplate spurring extending into the foramen. Mild facet degenerative changes. Minimal central stenosis. No significant foraminal stenosis.   L4-L5: Mild  degenerative disc disease. Shallow central disc extrusion extending along the posterior aspect of L4. Mild facet degenerative changes/ligamentum flavum thickening. Mild central stenosis. Mild lateral recess stenosis. No significant foraminal  stenosis.     L5-S1: Mild degenerative disc disease. Mild facet degenerative changes. Diffuse disc bulge and endplate spurring. Small left foraminal disc protrusion. Mild left foraminal stenosis. Mild left lateral recess stenosis. No significant central stenosis.   SPINAL CORD:  No abnormal signal is demonstrated within the visualized distal spinal cord. Cauda equina appears unremarkable. Conus medullaris terminates at L1.   PARASPINAL TISSUES:  Intact.   VISUALIZED SACRUM AND LOWER THORACIC SPINE:  #  Mild sacroiliac joint degenerative changes.  #  Tarlov cysts in the sacrum.  #  Degenerative disc disease at T11-T12.   INCIDENTAL:  #  Colonic diverticulosis.  #  Small fat-containing umbilical hernia.    IMPRESSION:  1.  Multilevel degenerative changes of the lumbar spine, as detailed above.  2.  Central stenosis is most pronounced and mild at L4-L5.  3.  Foraminal stenosis is most pronounced and mild on the left at L5-S1.  4.  Disc herniations at L1-L2, L4-L5, and L5-S1.   Electronically Signed by: Salvadore Oxford on 09/25/2021 2:26 PM Resulting Agency Comment  [id number] Exam End: 09/25/21 13:32   Specimen Collected: 09/25/21 14:19 Last Resulted: 09/25/21 14:26  Received From: [facility]  Result Received: 09/30/21 10:00

## 2021-09-30 NOTE — Telephone Encounter (Signed)
Patient called to see if Dr. Aline Brochure had gotten her MRI results back. Stated that he is suppose to call her with the results, but hasn't heard anything.  Please call her [phone]

## 2021-10-01 ENCOUNTER — Telehealth: Payer: Self-pay | Admitting: Orthopedic Surgery

## 2021-10-01 ENCOUNTER — Telehealth: Payer: Self-pay | Admitting: Radiology

## 2021-10-01 DIAGNOSIS — G8929 Other chronic pain: Secondary | ICD-10-CM

## 2021-10-01 NOTE — Telephone Encounter (Signed)
Patient relays Dr Aline Brochure has called her and has relayed MRI results to her. Patient has called back to ask another question about the pain at the end of her tail bone. Please advise. [phone]

## 2021-10-01 NOTE — Telephone Encounter (Signed)
-----   Message from Carole Civil, MD sent at 10/01/2021  2:05 PM EST ----- Please arrange appointment with [facility] neurosurgery

## 2021-10-01 NOTE — Telephone Encounter (Signed)
Called patient to discuss MRI results  MRI shows 3 disc herniations 1 at L1 1 at L4 1 at L5  Degenerative disc disease  Central and foraminal stenosis  Recommend patient see neurosurgery she agreed to see [place] [facility]  We will arrange the appointment through referral and they will call her

## 2021-10-01 NOTE — Telephone Encounter (Signed)
I called her back we discussed told her to call and set up the neurosurgery appointment to call [phone] and they will schedule her. She will call

## 2021-10-01 NOTE — Telephone Encounter (Signed)
Patient called back has number to call [facility] She has trip planned  And she is asking if you have any limitations on what she can do. She states she has not been dancing but if you have any other restrictions let her know

## 2021-10-01 NOTE — Telephone Encounter (Signed)
Schedule appointment with neurosurgery

## 2021-10-02 NOTE — Telephone Encounter (Signed)
I called her to advise mailbox is full

## 2021-10-03 DIAGNOSIS — M533 Sacrococcygeal disorders, not elsewhere classified: Secondary | ICD-10-CM | POA: Diagnosis not present

## 2021-10-03 DIAGNOSIS — Z6831 Body mass index (BMI) 31.0-31.9, adult: Secondary | ICD-10-CM | POA: Diagnosis not present

## 2021-10-03 DIAGNOSIS — I10 Essential (primary) hypertension: Secondary | ICD-10-CM | POA: Diagnosis not present

## 2021-10-06 NOTE — Telephone Encounter (Signed)
Unable to reach if she calls back we will advise.

## 2021-10-30 DIAGNOSIS — M533 Sacrococcygeal disorders, not elsewhere classified: Secondary | ICD-10-CM | POA: Diagnosis not present

## 2021-11-19 DIAGNOSIS — M79675 Pain in left toe(s): Secondary | ICD-10-CM | POA: Diagnosis not present

## 2021-11-19 DIAGNOSIS — L84 Corns and callosities: Secondary | ICD-10-CM | POA: Diagnosis not present

## 2021-11-28 DIAGNOSIS — M533 Sacrococcygeal disorders, not elsewhere classified: Secondary | ICD-10-CM | POA: Diagnosis not present

## 2021-12-16 DIAGNOSIS — M533 Sacrococcygeal disorders, not elsewhere classified: Secondary | ICD-10-CM | POA: Diagnosis not present

## 2021-12-30 ENCOUNTER — Encounter: Payer: Self-pay | Admitting: Gastroenterology

## 2021-12-30 ENCOUNTER — Ambulatory Visit: Payer: PPO | Admitting: Gastroenterology

## 2021-12-30 VITALS — BP 116/76 | HR 82 | Temp 98.6°F | Ht 64.0 in | Wt 183.4 lb

## 2021-12-30 DIAGNOSIS — K219 Gastro-esophageal reflux disease without esophagitis: Secondary | ICD-10-CM | POA: Diagnosis not present

## 2021-12-30 DIAGNOSIS — K5900 Constipation, unspecified: Secondary | ICD-10-CM | POA: Diagnosis not present

## 2021-12-30 NOTE — Progress Notes (Signed)
Gastroenterology Office Note     Primary Care Physician:  Celene Squibb, MD  Primary Gastroenterologist: Dr. Gala Romney    Chief Complaint   Chief Complaint  Patient presents with   Follow-up    No GI complaints     History of Present Illness   Jill Roach is a 71 y.o. female presenting today in follow-up with a history of GERD, globus sensation, and constipation. EGD in 2018 with LA grade B esophagitis s/p dilation, normal stomach and duodenum. BPE 2018 with moderate to marked esophageal dysmotility. Manometry and ambulatory esophageal 24 hour pH monitoring in 2004 negative.  ENT evaluation in 2019 for globus sensation was related to LPR.  Colonoscopy in 2018 with diverticulosis, otherwise normal. Repeat in 2028.   Pantoprazole once per day. Unable to come off of this. Controlling GERD symptoms.   Has had chronic back pain. Affects mobility. No abdominal pain. PCP refills pantoprazole.    Past Medical History:  Diagnosis Date   Allergy    Cataract    GERD (gastroesophageal reflux disease)    Hyperlipidemia    Hypertension    PONV (postoperative nausea and vomiting)    Thyroid disease     Past Surgical History:  Procedure Laterality Date   ambulatory esophageal 24 hour pH study  2004   within normal range. Dr. Johnathan Hausen   COLONOSCOPY N/A 11/04/2016   diverticulosis, otherwise normal. Repeat in 2028.    ESOPHAGEAL MANOMETRY  2004   Dr. Johnathan Hausen: normal   ESOPHAGOGASTRODUODENOSCOPY  2003   Dr. Johnathan Hausen: normal    ESOPHAGOGASTRODUODENOSCOPY N/A 11/04/2016   LA grade B esophagitis s/p dilation, normal stomach and duodenum   EYE SURGERY     cataracts   FRACTURE SURGERY     cataract surgery    LIGAMENT REPAIR     rt knee   MALONEY DILATION N/A 11/04/2016   Procedure: Venia Minks DILATION;  Surgeon: Daneil Dolin, MD;  Location: [facility];  Service: Endoscopy;  Laterality: N/A;   TUBAL LIGATION      Current Outpatient Medications   Medication Sig Dispense Refill   diclofenac Sodium (VOLTAREN) 1 % GEL Apply 4 g topically 4 (four) times daily. 50 g 0   fluocinonide-emollient (LIDEX-E) 0.05 % cream Apply 1 application topically 2 (two) times daily. 30 g 11   fluticasone (FLONASE) 50 MCG/ACT nasal spray Place 2 sprays into both nostrils daily. 16 g 6   gabapentin (NEURONTIN) 100 MG capsule Take 1 capsule (100 mg total) by mouth 3 (three) times daily. 90 capsule 0   levocetirizine (XYZAL) 5 MG tablet Take 1 tablet (5 mg total) by mouth every evening. 90 tablet 0   levothyroxine (SYNTHROID) 88 MCG tablet Take 1 tablet by mouth once daily 30 tablet 0   lisinopril-hydrochlorothiazide (ZESTORETIC) 20-12.5 MG tablet Take 1 tablet by mouth daily. 90 tablet 0   meclizine (ANTIVERT) 25 MG tablet Take 25 mg by mouth 3 (three) times daily as needed for dizziness.     montelukast (SINGULAIR) 10 MG tablet Take 1 tablet (10 mg total) by mouth at bedtime. 90 tablet 0   nabumetone (RELAFEN) 500 MG tablet Take 500 mg by mouth daily.     pantoprazole (PROTONIX) 40 MG tablet Take 1 tablet (40 mg total) by mouth 2 (two) times daily before a meal. 60 tablet 5   pantoprazole (PROTONIX) 40 MG tablet TAKE 1 TABLET BY MOUTH TWICE DAILY BEFORE A MEAL 180 tablet 3  traZODone (DESYREL) 50 MG tablet TAKE 1/2 TO 1  TABLET BY MOUTH AT BEDTIME AS NEEDED FOR SLEEP 45 tablet 0   fluconazole (DIFLUCAN) 150 MG tablet Take 1 tablet (150 mg total) by mouth daily. (Patient not taking: Reported on 12/30/2021) 1 tablet 0   predniSONE (STERAPRED UNI-PAK 48 TAB) 10 MG (48) TBPK tablet Take by mouth daily. 10 mg DS 12 days (Patient not taking: Reported on 12/30/2021) 48 tablet 0   vitamin B-12 (CYANOCOBALAMIN) 500 MCG tablet Take 1,000 mcg by mouth in the morning and at bedtime. (Patient not taking: Reported on 12/30/2021)     No current facility-administered medications for this visit.    Allergies as of 12/30/2021 - Review Complete 12/30/2021  Allergen Reaction Noted    Codeine Nausea Only 09/20/2015    Family History  Problem Relation Age of Onset   Hypertension Mother    Arthritis Father        Rheumatoid    Diabetes Father    Hypertension Father    Thyroid disease Father    Arthritis Brother        Rheumatoid   Thyroid disease Brother    Heart disease Maternal Grandmother        stomach cancer   Hypertension Maternal Grandmother    Cancer Paternal Grandmother        LEUKEMIA/LUNG CNACER,   Thyroid disease Paternal Grandmother    Hypertension Paternal Grandmother    Cancer Other    Mental illness Other    Cancer Maternal Uncle    Thyroid disease Paternal Aunt    Colon cancer Neg Hx     Social History   Socioeconomic History   Marital status: Divorced    Spouse name: Not on file   Number of children: Not on file   Years of education: Not on file   Highest education level: Not on file  Occupational History   Not on file  Tobacco Use   Smoking status: Never   Smokeless tobacco: Never  Vaping Use   Vaping Use: Never used  Substance and Sexual Activity   Alcohol use: No   Drug use: No   Sexual activity: Not Currently  Other Topics Concern   Not on file  Social History Narrative   Not on file   Social Determinants of Health   Financial Resource Strain: Not on file  Food Insecurity: Not on file  Transportation Needs: Not on file  Physical Activity: Not on file  Stress: Not on file  Social Connections: Not on file  Intimate Partner Violence: Not on file     Review of Systems   Gen: Denies any fever, chills, fatigue, weight loss, lack of appetite.  CV: Denies chest pain, heart palpitations, peripheral edema, syncope.  Resp: Denies shortness of breath at rest or with exertion. Denies wheezing or cough.  GI: see HPI GU : Denies urinary burning, urinary frequency, urinary hesitancy MS: Denies joint pain, muscle weakness, cramps, or limitation of movement.  Derm: Denies rash, itching, dry skin Psych: Denies  depression, anxiety, memory loss, and confusion Heme: Denies bruising, bleeding, and enlarged lymph nodes.   Physical Exam   BP 116/76   Pulse 82   Temp 98.6 F (37 C)   Ht '5\' 4"'$  (1.626 m)   Wt 183 lb 6.4 oz (83.2 kg)   BMI 31.48 kg/m  General:   Alert and oriented. Pleasant and cooperative. Well-nourished and well-developed.  Head:  Normocephalic and atraumatic. Eyes:  Without icterus Abdomen:  +  BS, soft, non-tender and non-distended. No HSM noted. No guarding or rebound. No masses appreciated.  Rectal:  Deferred  Msk:  Symmetrical without gross deformities. Normal posture. Extremities:  Without edema. Neurologic:  Alert and  oriented x4;  grossly normal neurologically. Skin:  Intact without significant lesions or rashes. Psych:  Alert and cooperative. Normal mood and affect.   Assessment   Jill Roach is a 71 y.o. female presenting today in follow-up with a history of chronic  GERD, globus sensation, and constipation.   GERD: doing well on pantoprazole once daily. She has tried to wean off but unable to tolerate this. PCP has been refilling.   Constipation: recommend Benefiber 2 teaspoons daily, up to three times a day if needed.    As patient doing well and PPI managed by PCP, we can see her back as needed.     PLAN   Continue pantoprazole daily Colonoscopy due in 2028 Return prn  Annitta Needs, PhD, [facility]

## 2021-12-30 NOTE — Patient Instructions (Signed)
You can continue pantoprazole once daily. If your primary care is comfortable refilling this, we don't have to see you yearly.  For constipation: trial of Benefiber 2 teaspoons each day, up to three times a day.  We will see you as needed! Please call if any concerns!  Your next colonoscopy will be in 2028~  I enjoyed seeing you again today! As you know, I value our relationship and want to provide genuine, compassionate, and quality care. I welcome your feedback. If you receive a survey regarding your visit,  I greatly appreciate you taking time to fill this out. See you next time!  Annitta Needs, PhD, ANP-BC [facility]

## 2022-01-19 DIAGNOSIS — M533 Sacrococcygeal disorders, not elsewhere classified: Secondary | ICD-10-CM | POA: Diagnosis not present

## 2022-01-19 DIAGNOSIS — Z6831 Body mass index (BMI) 31.0-31.9, adult: Secondary | ICD-10-CM | POA: Diagnosis not present

## 2022-02-09 ENCOUNTER — Telehealth: Payer: Self-pay | Admitting: Orthopedic Surgery

## 2022-02-09 NOTE — Telephone Encounter (Signed)
Patient is requesting a permeant handicap sticker.  She went to the doctor you referred her to and they said they can't do surgery because the nerve is so slow down her back  and they are going to refer  her to pain management doctor.   She asked the referring office to do the paperwork for the handicap sticker, they told her to call us.   Please call patient back and advise.

## 2022-02-09 NOTE — Telephone Encounter (Signed)
On desk for signature, will call when signed.

## 2022-02-11 NOTE — Telephone Encounter (Signed)
I  called her to advise ready for pick up. Left message to advise ppw ready for pick up

## 2022-02-18 ENCOUNTER — Other Ambulatory Visit (HOSPITAL_COMMUNITY): Payer: Self-pay | Admitting: Family Medicine

## 2022-02-18 ENCOUNTER — Other Ambulatory Visit: Payer: Self-pay | Admitting: Family Medicine

## 2022-02-18 DIAGNOSIS — Z1231 Encounter for screening mammogram for malignant neoplasm of breast: Secondary | ICD-10-CM

## 2022-02-23 ENCOUNTER — Ambulatory Visit (HOSPITAL_COMMUNITY)
Admission: RE | Admit: 2022-02-23 | Discharge: 2022-02-23 | Disposition: A | Discharge status: Home / Self Care | Payer: PPO | Source: Ambulatory Visit | Attending: Family Medicine | Admitting: Family Medicine

## 2022-02-23 DIAGNOSIS — Z1231 Encounter for screening mammogram for malignant neoplasm of breast: Secondary | ICD-10-CM | POA: Insufficient documentation

## 2022-03-09 DIAGNOSIS — K1379 Other lesions of oral mucosa: Secondary | ICD-10-CM | POA: Diagnosis not present

## 2022-03-09 DIAGNOSIS — M5442 Lumbago with sciatica, left side: Secondary | ICD-10-CM | POA: Diagnosis not present

## 2022-03-30 DIAGNOSIS — E782 Mixed hyperlipidemia: Secondary | ICD-10-CM | POA: Diagnosis not present

## 2022-03-30 DIAGNOSIS — E039 Hypothyroidism, unspecified: Secondary | ICD-10-CM | POA: Diagnosis not present

## 2022-03-30 DIAGNOSIS — E538 Deficiency of other specified B group vitamins: Secondary | ICD-10-CM | POA: Diagnosis not present

## 2022-03-30 DIAGNOSIS — R7301 Impaired fasting glucose: Secondary | ICD-10-CM | POA: Diagnosis not present

## 2022-04-06 DIAGNOSIS — R42 Dizziness and giddiness: Secondary | ICD-10-CM | POA: Diagnosis not present

## 2022-04-06 DIAGNOSIS — E559 Vitamin D deficiency, unspecified: Secondary | ICD-10-CM | POA: Diagnosis not present

## 2022-04-06 DIAGNOSIS — G4700 Insomnia, unspecified: Secondary | ICD-10-CM | POA: Diagnosis not present

## 2022-04-06 DIAGNOSIS — R7303 Prediabetes: Secondary | ICD-10-CM | POA: Diagnosis not present

## 2022-04-06 DIAGNOSIS — E538 Deficiency of other specified B group vitamins: Secondary | ICD-10-CM | POA: Diagnosis not present

## 2022-04-06 DIAGNOSIS — E039 Hypothyroidism, unspecified: Secondary | ICD-10-CM | POA: Diagnosis not present

## 2022-04-06 DIAGNOSIS — E782 Mixed hyperlipidemia: Secondary | ICD-10-CM | POA: Diagnosis not present

## 2022-04-06 DIAGNOSIS — K219 Gastro-esophageal reflux disease without esophagitis: Secondary | ICD-10-CM | POA: Diagnosis not present

## 2022-04-06 DIAGNOSIS — M5442 Lumbago with sciatica, left side: Secondary | ICD-10-CM | POA: Diagnosis not present

## 2022-04-06 DIAGNOSIS — J309 Allergic rhinitis, unspecified: Secondary | ICD-10-CM | POA: Diagnosis not present

## 2022-04-06 DIAGNOSIS — Z0001 Encounter for general adult medical examination with abnormal findings: Secondary | ICD-10-CM | POA: Diagnosis not present

## 2022-04-06 DIAGNOSIS — I10 Essential (primary) hypertension: Secondary | ICD-10-CM | POA: Diagnosis not present

## 2022-04-30 ENCOUNTER — Ambulatory Visit: Payer: PPO | Admitting: Obstetrics & Gynecology

## 2022-05-04 ENCOUNTER — Ambulatory Visit (INDEPENDENT_AMBULATORY_CARE_PROVIDER_SITE_OTHER): Payer: PPO | Admitting: Obstetrics & Gynecology

## 2022-05-04 ENCOUNTER — Encounter: Payer: Self-pay | Admitting: Obstetrics & Gynecology

## 2022-05-04 VITALS — BP 155/85 | HR 80 | Ht 64.0 in | Wt 189.0 lb

## 2022-05-04 DIAGNOSIS — Z01419 Encounter for gynecological examination (general) (routine) without abnormal findings: Secondary | ICD-10-CM | POA: Diagnosis not present

## 2022-05-04 DIAGNOSIS — Z1211 Encounter for screening for malignant neoplasm of colon: Secondary | ICD-10-CM | POA: Diagnosis not present

## 2022-05-04 DIAGNOSIS — L900 Lichen sclerosus et atrophicus: Secondary | ICD-10-CM | POA: Diagnosis not present

## 2022-05-04 MED ORDER — FLUOCINONIDE EMULSIFIED BASE 0.05 % EX CREA: 1.0000 | TOPICAL_CREAM | Freq: Two times a day (BID) | CUTANEOUS | 11 refills | Status: DC

## 2022-05-04 NOTE — Progress Notes (Signed)
Subjective:     Jill Roach is a 71 y.o. female here for a routine exam.  No LMP recorded. Patient is postmenopausal. [id number] Birth Control Method:  postmenopausal Menstrual Calendar(currently): amenorrheic  Current complaints: none.   Current acute medical issues:     Recent Gynecologic History No LMP recorded. Patient is postmenopausal. Last Pap: n/a,   Last mammogram: 7/23,  normal  Past Medical History:  Diagnosis Date   Allergy    Cataract    GERD (gastroesophageal reflux disease)    Hyperlipidemia    Hypertension    PONV (postoperative nausea and vomiting)    Thyroid disease     Past Surgical History:  Procedure Laterality Date   ambulatory esophageal 24 hour pH study  2004   within normal range. Dr. Johnathan Hausen   COLONOSCOPY N/A 11/04/2016   diverticulosis, otherwise normal. Repeat in 2028.    ESOPHAGEAL MANOMETRY  2004   Dr. Johnathan Hausen: normal   ESOPHAGOGASTRODUODENOSCOPY  2003   Dr. Johnathan Hausen: normal    ESOPHAGOGASTRODUODENOSCOPY N/A 11/04/2016   LA grade B esophagitis s/p dilation, normal stomach and duodenum   EYE SURGERY     cataracts   FRACTURE SURGERY     cataract surgery    LIGAMENT REPAIR     rt knee   MALONEY DILATION N/A 11/04/2016   Procedure: Venia Minks DILATION;  Surgeon: Daneil Dolin, MD;  Location: [facility];  Service: Endoscopy;  Laterality: N/A;   TUBAL LIGATION      OB History     Gravida  3   Para  3   Term  3   Preterm      AB      Living  3      SAB      IAB      Ectopic      Multiple      Live Births  3           Social History   Socioeconomic History   Marital status: Divorced    Spouse name: Not on file   Number of children: Not on file   Years of education: Not on file   Highest education level: Not on file  Occupational History   Not on file  Tobacco Use   Smoking status: Never   Smokeless tobacco: Never  Vaping Use   Vaping Use: Never used  Substance and Sexual Activity    Alcohol use: No   Drug use: No   Sexual activity: Not Currently  Other Topics Concern   Not on file  Social History Narrative   Not on file   Social Determinants of Health   Financial Resource Strain: Not on file  Food Insecurity: Not on file  Transportation Needs: Not on file  Physical Activity: Not on file  Stress: Not on file  Social Connections: Not on file    Family History  Problem Relation Age of Onset   Hypertension Mother    Arthritis Father        Rheumatoid    Diabetes Father    Hypertension Father    Thyroid disease Father    Arthritis Brother        Rheumatoid   Thyroid disease Brother    Heart disease Maternal Grandmother        stomach cancer   Hypertension Maternal Grandmother    Cancer Paternal Grandmother        LEUKEMIA/LUNG CNACER,   Thyroid disease Paternal Grandmother  Hypertension Paternal Grandmother    Cancer Other    Mental illness Other    Cancer Maternal Uncle    Thyroid disease Paternal Aunt    Colon cancer Neg Hx      Current Outpatient Medications:    diclofenac Sodium (VOLTAREN) 1 % GEL, Apply 4 g topically 4 (four) times daily., Disp: 50 g, Rfl: 0   fluticasone (FLONASE) 50 MCG/ACT nasal spray, Place 2 sprays into both nostrils daily., Disp: 16 g, Rfl: 6   levocetirizine (XYZAL) 5 MG tablet, Take 1 tablet (5 mg total) by mouth every evening., Disp: 90 tablet, Rfl: 0   levothyroxine (SYNTHROID) 88 MCG tablet, Take 1 tablet by mouth once daily, Disp: 30 tablet, Rfl: 0   lisinopril-hydrochlorothiazide (ZESTORETIC) 20-12.5 MG tablet, Take 1 tablet by mouth daily., Disp: 90 tablet, Rfl: 0   montelukast (SINGULAIR) 10 MG tablet, Take 1 tablet (10 mg total) by mouth at bedtime., Disp: 90 tablet, Rfl: 0   pantoprazole (PROTONIX) 40 MG tablet, Take 1 tablet (40 mg total) by mouth 2 (two) times daily before a meal., Disp: 60 tablet, Rfl: 5   traZODone (DESYREL) 50 MG tablet, TAKE 1/2 TO 1  TABLET BY MOUTH AT BEDTIME AS NEEDED FOR SLEEP,  Disp: 45 tablet, Rfl: 0   fluconazole (DIFLUCAN) 150 MG tablet, Take 1 tablet (150 mg total) by mouth daily. (Patient not taking: Reported on 12/30/2021), Disp: 1 tablet, Rfl: 0   fluocinonide-emollient (LIDEX-E) 0.05 % cream, Apply 1 Application topically 2 (two) times daily., Disp: 30 g, Rfl: 11   gabapentin (NEURONTIN) 100 MG capsule, Take 1 capsule (100 mg total) by mouth 3 (three) times daily. (Patient not taking: Reported on 05/04/2022), Disp: 90 capsule, Rfl: 0   meclizine (ANTIVERT) 25 MG tablet, Take 25 mg by mouth 3 (three) times daily as needed for dizziness. (Patient not taking: Reported on 05/04/2022), Disp: , Rfl:    nabumetone (RELAFEN) 500 MG tablet, Take 500 mg by mouth daily. (Patient not taking: Reported on 05/04/2022), Disp: , Rfl:    pantoprazole (PROTONIX) 40 MG tablet, TAKE 1 TABLET BY MOUTH TWICE DAILY BEFORE A MEAL (Patient not taking: Reported on 05/04/2022), Disp: 180 tablet, Rfl: 3  Review of Systems  Review of Systems  Constitutional: Negative for fever, chills, weight loss, malaise/fatigue and diaphoresis.  HENT: Negative for hearing loss, ear pain, nosebleeds, congestion, sore throat, neck pain, tinnitus and ear discharge.   Eyes: Negative for blurred vision, double vision, photophobia, pain, discharge and redness.  Respiratory: Negative for cough, hemoptysis, sputum production, shortness of breath, wheezing and stridor.   Cardiovascular: Negative for chest pain, palpitations, orthopnea, claudication, leg swelling and PND.  Gastrointestinal: negative for abdominal pain. Negative for heartburn, nausea, vomiting, diarrhea, constipation, blood in stool and melena.  Genitourinary: Negative for dysuria, urgency, frequency, hematuria and flank pain.  Musculoskeletal: Negative for myalgias, back pain, joint pain and falls.  Skin: Negative for itching and rash.  Neurological: Negative for dizziness, tingling, tremors, sensory change, speech change, focal weakness, seizures, loss  of consciousness, weakness and headaches.  Endo/Heme/Allergies: Negative for environmental allergies and polydipsia. Does not bruise/bleed easily.  Psychiatric/Behavioral: Negative for depression, suicidal ideas, hallucinations, memory loss and substance abuse. The patient is not nervous/anxious and does not have insomnia.        Objective:  Blood pressure (!) 155/85, pulse 80, height '5\' 4"'$  (1.626 m), weight 189 lb (85.7 kg).   Physical Exam  Vitals reviewed. Constitutional: She is oriented to person, place, and time.  She appears well-developed and well-nourished.  HENT:  Head: Normocephalic and atraumatic.        Right Ear: External ear normal.  Left Ear: External ear normal.  Nose: Nose normal.  Mouth/Throat: Oropharynx is clear and moist.  Eyes: Conjunctivae and EOM are normal. Pupils are equal, round, and reactive to light. Right eye exhibits no discharge. Left eye exhibits no discharge. No scleral icterus.  Neck: Normal range of motion. Neck supple. No tracheal deviation present. No thyromegaly present.  Cardiovascular: Normal rate, regular rhythm, normal heart sounds and intact distal pulses.  Exam reveals no gallop and no friction rub.   No murmur heard. Respiratory: Effort normal and breath sounds normal. No respiratory distress. She has no wheezes. She has no rales. She exhibits no tenderness.  GI: Soft. Bowel sounds are normal. She exhibits no distension and no mass. There is no tenderness. There is no rebound and no guarding.  Genitourinary:  Breasts no masses skin changes or nipple changes bilaterally      Vulva is normal without lesions Vagina is pink moist without discharge Cervix normal in appearance and pap is done Uterus is normal size shape and contour Adnexa is negative with normal sized ovaries  {Rectal    hemoccult negative, normal tone, no masses  Musculoskeletal: Normal range of motion. She exhibits no edema and no tenderness.  Neurological: She is alert and  oriented to person, place, and time. She has normal reflexes. She displays normal reflexes. No cranial nerve deficit. She exhibits normal muscle tone. Coordination normal.  Skin: Skin is warm and dry. No rash noted. No erythema. No pallor.  Psychiatric: She has a normal mood and affect. Her behavior is normal. Judgment and thought content normal.       Medications Ordered at today's visit: Meds ordered this encounter  Medications   fluocinonide-emollient (LIDEX-E) 0.05 % cream    Sig: Apply 1 Application topically 2 (two) times daily.    Dispense:  30 g    Refill:  11    Other orders placed at today's visit: No orders of the defined types were placed in this encounter.     Assessment:    Normal Gyn exam.   LSA, continue lidex E,variable frequency based on symptoms Plan:    Follow up in: 3 years.     Return in about 3 years (around 05/04/2025), or if symptoms worsen or fail to improve, for yearly.

## 2022-05-11 ENCOUNTER — Encounter: Payer: Self-pay | Admitting: Obstetrics & Gynecology

## 2022-05-11 MED ORDER — TRIAMCINOLONE ACETONIDE 0.5 % EX OINT: 1.0000 | TOPICAL_OINTMENT | Freq: Two times a day (BID) | CUTANEOUS | 11 refills | Status: DC

## 2022-07-13 DIAGNOSIS — E782 Mixed hyperlipidemia: Secondary | ICD-10-CM | POA: Diagnosis not present

## 2022-07-13 DIAGNOSIS — E559 Vitamin D deficiency, unspecified: Secondary | ICD-10-CM | POA: Diagnosis not present

## 2022-07-13 DIAGNOSIS — R7303 Prediabetes: Secondary | ICD-10-CM | POA: Diagnosis not present

## 2022-07-13 DIAGNOSIS — E039 Hypothyroidism, unspecified: Secondary | ICD-10-CM | POA: Diagnosis not present

## 2022-07-13 DIAGNOSIS — E538 Deficiency of other specified B group vitamins: Secondary | ICD-10-CM | POA: Diagnosis not present

## 2022-07-16 DIAGNOSIS — E782 Mixed hyperlipidemia: Secondary | ICD-10-CM | POA: Diagnosis not present

## 2022-07-16 DIAGNOSIS — G4700 Insomnia, unspecified: Secondary | ICD-10-CM | POA: Diagnosis not present

## 2022-07-16 DIAGNOSIS — M5442 Lumbago with sciatica, left side: Secondary | ICD-10-CM | POA: Diagnosis not present

## 2022-07-16 DIAGNOSIS — J019 Acute sinusitis, unspecified: Secondary | ICD-10-CM | POA: Diagnosis not present

## 2022-07-16 DIAGNOSIS — E538 Deficiency of other specified B group vitamins: Secondary | ICD-10-CM | POA: Diagnosis not present

## 2022-07-16 DIAGNOSIS — J309 Allergic rhinitis, unspecified: Secondary | ICD-10-CM | POA: Diagnosis not present

## 2022-07-16 DIAGNOSIS — R42 Dizziness and giddiness: Secondary | ICD-10-CM | POA: Diagnosis not present

## 2022-07-16 DIAGNOSIS — I10 Essential (primary) hypertension: Secondary | ICD-10-CM | POA: Diagnosis not present

## 2022-07-16 DIAGNOSIS — K219 Gastro-esophageal reflux disease without esophagitis: Secondary | ICD-10-CM | POA: Diagnosis not present

## 2022-07-16 DIAGNOSIS — R7303 Prediabetes: Secondary | ICD-10-CM | POA: Diagnosis not present

## 2022-07-16 DIAGNOSIS — E559 Vitamin D deficiency, unspecified: Secondary | ICD-10-CM | POA: Diagnosis not present

## 2022-07-16 DIAGNOSIS — E039 Hypothyroidism, unspecified: Secondary | ICD-10-CM | POA: Diagnosis not present

## 2022-09-02 DIAGNOSIS — Z79899 Other long term (current) drug therapy: Secondary | ICD-10-CM | POA: Diagnosis not present

## 2022-09-02 DIAGNOSIS — E559 Vitamin D deficiency, unspecified: Secondary | ICD-10-CM | POA: Diagnosis not present

## 2022-09-02 DIAGNOSIS — M129 Arthropathy, unspecified: Secondary | ICD-10-CM | POA: Diagnosis not present

## 2022-09-02 DIAGNOSIS — M533 Sacrococcygeal disorders, not elsewhere classified: Secondary | ICD-10-CM | POA: Diagnosis not present

## 2022-09-02 DIAGNOSIS — G894 Chronic pain syndrome: Secondary | ICD-10-CM | POA: Diagnosis not present

## 2022-09-14 ENCOUNTER — Other Ambulatory Visit: Payer: Self-pay | Admitting: Orthopedic Surgery

## 2022-09-14 DIAGNOSIS — M545 Low back pain, unspecified: Secondary | ICD-10-CM

## 2022-09-15 DIAGNOSIS — M533 Sacrococcygeal disorders, not elsewhere classified: Secondary | ICD-10-CM | POA: Diagnosis not present

## 2022-09-15 DIAGNOSIS — M109 Gout, unspecified: Secondary | ICD-10-CM | POA: Diagnosis not present

## 2022-09-15 DIAGNOSIS — R030 Elevated blood-pressure reading, without diagnosis of hypertension: Secondary | ICD-10-CM | POA: Diagnosis not present

## 2022-09-15 DIAGNOSIS — Z6831 Body mass index (BMI) 31.0-31.9, adult: Secondary | ICD-10-CM | POA: Diagnosis not present

## 2022-09-15 DIAGNOSIS — Z79899 Other long term (current) drug therapy: Secondary | ICD-10-CM | POA: Diagnosis not present

## 2022-09-15 DIAGNOSIS — G894 Chronic pain syndrome: Secondary | ICD-10-CM | POA: Diagnosis not present

## 2022-09-16 DIAGNOSIS — L820 Inflamed seborrheic keratosis: Secondary | ICD-10-CM | POA: Diagnosis not present

## 2022-09-16 DIAGNOSIS — L821 Other seborrheic keratosis: Secondary | ICD-10-CM | POA: Diagnosis not present

## 2022-09-28 DIAGNOSIS — U071 COVID-19: Secondary | ICD-10-CM | POA: Diagnosis not present

## 2022-10-08 ENCOUNTER — Encounter: Payer: Self-pay | Admitting: Radiology

## 2022-10-14 DIAGNOSIS — Z6831 Body mass index (BMI) 31.0-31.9, adult: Secondary | ICD-10-CM | POA: Diagnosis not present

## 2022-10-14 DIAGNOSIS — Z79899 Other long term (current) drug therapy: Secondary | ICD-10-CM | POA: Diagnosis not present

## 2022-10-14 DIAGNOSIS — M109 Gout, unspecified: Secondary | ICD-10-CM | POA: Diagnosis not present

## 2022-10-14 DIAGNOSIS — R030 Elevated blood-pressure reading, without diagnosis of hypertension: Secondary | ICD-10-CM | POA: Diagnosis not present

## 2022-10-14 DIAGNOSIS — M533 Sacrococcygeal disorders, not elsewhere classified: Secondary | ICD-10-CM | POA: Diagnosis not present

## 2022-10-14 DIAGNOSIS — G894 Chronic pain syndrome: Secondary | ICD-10-CM | POA: Diagnosis not present

## 2022-10-20 DIAGNOSIS — E782 Mixed hyperlipidemia: Secondary | ICD-10-CM | POA: Diagnosis not present

## 2022-10-20 DIAGNOSIS — E538 Deficiency of other specified B group vitamins: Secondary | ICD-10-CM | POA: Diagnosis not present

## 2022-10-20 DIAGNOSIS — R7303 Prediabetes: Secondary | ICD-10-CM | POA: Diagnosis not present

## 2022-10-20 DIAGNOSIS — E039 Hypothyroidism, unspecified: Secondary | ICD-10-CM | POA: Diagnosis not present

## 2022-10-20 DIAGNOSIS — E559 Vitamin D deficiency, unspecified: Secondary | ICD-10-CM | POA: Diagnosis not present

## 2022-10-23 DIAGNOSIS — Z0000 Encounter for general adult medical examination without abnormal findings: Secondary | ICD-10-CM | POA: Diagnosis not present

## 2022-10-23 DIAGNOSIS — E782 Mixed hyperlipidemia: Secondary | ICD-10-CM | POA: Diagnosis not present

## 2022-10-23 DIAGNOSIS — E538 Deficiency of other specified B group vitamins: Secondary | ICD-10-CM | POA: Diagnosis not present

## 2022-10-23 DIAGNOSIS — J309 Allergic rhinitis, unspecified: Secondary | ICD-10-CM | POA: Diagnosis not present

## 2022-10-23 DIAGNOSIS — M5442 Lumbago with sciatica, left side: Secondary | ICD-10-CM | POA: Diagnosis not present

## 2022-10-23 DIAGNOSIS — K219 Gastro-esophageal reflux disease without esophagitis: Secondary | ICD-10-CM | POA: Diagnosis not present

## 2022-10-23 DIAGNOSIS — R42 Dizziness and giddiness: Secondary | ICD-10-CM | POA: Diagnosis not present

## 2022-10-23 DIAGNOSIS — G4700 Insomnia, unspecified: Secondary | ICD-10-CM | POA: Diagnosis not present

## 2022-10-23 DIAGNOSIS — E039 Hypothyroidism, unspecified: Secondary | ICD-10-CM | POA: Diagnosis not present

## 2022-10-23 DIAGNOSIS — I10 Essential (primary) hypertension: Secondary | ICD-10-CM | POA: Diagnosis not present

## 2022-10-23 DIAGNOSIS — E559 Vitamin D deficiency, unspecified: Secondary | ICD-10-CM | POA: Diagnosis not present

## 2022-10-23 DIAGNOSIS — M109 Gout, unspecified: Secondary | ICD-10-CM | POA: Diagnosis not present

## 2022-10-24 DIAGNOSIS — G894 Chronic pain syndrome: Secondary | ICD-10-CM | POA: Diagnosis not present

## 2022-10-24 DIAGNOSIS — M533 Sacrococcygeal disorders, not elsewhere classified: Secondary | ICD-10-CM | POA: Diagnosis not present

## 2022-10-25 DIAGNOSIS — M533 Sacrococcygeal disorders, not elsewhere classified: Secondary | ICD-10-CM | POA: Diagnosis not present

## 2022-10-25 DIAGNOSIS — G894 Chronic pain syndrome: Secondary | ICD-10-CM | POA: Diagnosis not present

## 2022-10-26 DIAGNOSIS — M533 Sacrococcygeal disorders, not elsewhere classified: Secondary | ICD-10-CM | POA: Diagnosis not present

## 2022-10-26 DIAGNOSIS — G894 Chronic pain syndrome: Secondary | ICD-10-CM | POA: Diagnosis not present

## 2022-11-04 ENCOUNTER — Other Ambulatory Visit: Payer: Self-pay | Admitting: Gastroenterology

## 2022-11-05 ENCOUNTER — Telehealth: Payer: Self-pay

## 2022-11-05 DIAGNOSIS — K219 Gastro-esophageal reflux disease without esophagitis: Secondary | ICD-10-CM

## 2022-11-05 DIAGNOSIS — R09A2 Foreign body sensation, throat: Secondary | ICD-10-CM

## 2022-11-05 MED ORDER — PANTOPRAZOLE SODIUM 40 MG PO TBEC: 40.0000 mg | DELAYED_RELEASE_TABLET | Freq: Every day | ORAL | 3 refills | Status: AC

## 2022-11-05 NOTE — Addendum Note (Signed)
Addended by: Mahala Menghini on: 11/05/2022 03:37 PM   Modules accepted: Orders

## 2022-11-05 NOTE — Telephone Encounter (Signed)
Pt phoned needing a refill on her pantoprazole 40 mg. I asked her was she still taking it because someone put in on the pharmacy side pt reported not taking. Pt advises that she is. Her last ov was 12/30/2021

## 2022-11-10 DIAGNOSIS — M533 Sacrococcygeal disorders, not elsewhere classified: Secondary | ICD-10-CM | POA: Diagnosis not present

## 2022-11-10 DIAGNOSIS — G894 Chronic pain syndrome: Secondary | ICD-10-CM | POA: Diagnosis not present

## 2022-11-10 DIAGNOSIS — Z6830 Body mass index (BMI) 30.0-30.9, adult: Secondary | ICD-10-CM | POA: Diagnosis not present

## 2022-11-10 DIAGNOSIS — R030 Elevated blood-pressure reading, without diagnosis of hypertension: Secondary | ICD-10-CM | POA: Diagnosis not present

## 2022-11-10 DIAGNOSIS — Z79899 Other long term (current) drug therapy: Secondary | ICD-10-CM | POA: Diagnosis not present

## 2022-11-10 DIAGNOSIS — M109 Gout, unspecified: Secondary | ICD-10-CM | POA: Diagnosis not present

## 2022-11-24 DIAGNOSIS — M533 Sacrococcygeal disorders, not elsewhere classified: Secondary | ICD-10-CM | POA: Diagnosis not present

## 2022-11-24 DIAGNOSIS — G894 Chronic pain syndrome: Secondary | ICD-10-CM | POA: Diagnosis not present

## 2022-11-26 DIAGNOSIS — M533 Sacrococcygeal disorders, not elsewhere classified: Secondary | ICD-10-CM | POA: Diagnosis not present

## 2022-11-26 DIAGNOSIS — G894 Chronic pain syndrome: Secondary | ICD-10-CM | POA: Diagnosis not present

## 2022-12-09 DIAGNOSIS — M533 Sacrococcygeal disorders, not elsewhere classified: Secondary | ICD-10-CM | POA: Diagnosis not present

## 2022-12-09 DIAGNOSIS — R030 Elevated blood-pressure reading, without diagnosis of hypertension: Secondary | ICD-10-CM | POA: Diagnosis not present

## 2022-12-09 DIAGNOSIS — G894 Chronic pain syndrome: Secondary | ICD-10-CM | POA: Diagnosis not present

## 2022-12-09 DIAGNOSIS — Z6830 Body mass index (BMI) 30.0-30.9, adult: Secondary | ICD-10-CM | POA: Diagnosis not present

## 2022-12-09 DIAGNOSIS — Z79899 Other long term (current) drug therapy: Secondary | ICD-10-CM | POA: Diagnosis not present

## 2022-12-09 DIAGNOSIS — M109 Gout, unspecified: Secondary | ICD-10-CM | POA: Diagnosis not present

## 2022-12-14 DIAGNOSIS — Z79899 Other long term (current) drug therapy: Secondary | ICD-10-CM | POA: Diagnosis not present

## 2022-12-15 DIAGNOSIS — J309 Allergic rhinitis, unspecified: Secondary | ICD-10-CM | POA: Diagnosis not present

## 2022-12-15 DIAGNOSIS — J069 Acute upper respiratory infection, unspecified: Secondary | ICD-10-CM | POA: Diagnosis not present

## 2022-12-24 DIAGNOSIS — M533 Sacrococcygeal disorders, not elsewhere classified: Secondary | ICD-10-CM | POA: Diagnosis not present

## 2022-12-24 DIAGNOSIS — G894 Chronic pain syndrome: Secondary | ICD-10-CM | POA: Diagnosis not present

## 2022-12-26 DIAGNOSIS — M533 Sacrococcygeal disorders, not elsewhere classified: Secondary | ICD-10-CM | POA: Diagnosis not present

## 2022-12-26 DIAGNOSIS — G894 Chronic pain syndrome: Secondary | ICD-10-CM | POA: Diagnosis not present

## 2023-01-13 DIAGNOSIS — M533 Sacrococcygeal disorders, not elsewhere classified: Secondary | ICD-10-CM | POA: Diagnosis not present

## 2023-01-13 DIAGNOSIS — Z6830 Body mass index (BMI) 30.0-30.9, adult: Secondary | ICD-10-CM | POA: Diagnosis not present

## 2023-01-13 DIAGNOSIS — M109 Gout, unspecified: Secondary | ICD-10-CM | POA: Diagnosis not present

## 2023-01-13 DIAGNOSIS — Z79899 Other long term (current) drug therapy: Secondary | ICD-10-CM | POA: Diagnosis not present

## 2023-01-13 DIAGNOSIS — G894 Chronic pain syndrome: Secondary | ICD-10-CM | POA: Diagnosis not present

## 2023-01-13 DIAGNOSIS — R030 Elevated blood-pressure reading, without diagnosis of hypertension: Secondary | ICD-10-CM | POA: Diagnosis not present

## 2023-01-15 DIAGNOSIS — Z79899 Other long term (current) drug therapy: Secondary | ICD-10-CM | POA: Diagnosis not present

## 2023-01-24 DIAGNOSIS — M533 Sacrococcygeal disorders, not elsewhere classified: Secondary | ICD-10-CM | POA: Diagnosis not present

## 2023-01-24 DIAGNOSIS — G894 Chronic pain syndrome: Secondary | ICD-10-CM | POA: Diagnosis not present

## 2023-01-25 ENCOUNTER — Ambulatory Visit (HOSPITAL_COMMUNITY)
Admission: RE | Admit: 2023-01-25 | Discharge: 2023-01-25 | Disposition: A | Discharge status: Home / Self Care | Payer: PPO | Source: Ambulatory Visit | Attending: Otolaryngology | Admitting: Otolaryngology

## 2023-01-25 ENCOUNTER — Other Ambulatory Visit (HOSPITAL_COMMUNITY): Payer: Self-pay | Admitting: Otolaryngology

## 2023-01-25 DIAGNOSIS — M25512 Pain in left shoulder: Secondary | ICD-10-CM | POA: Diagnosis not present

## 2023-01-25 DIAGNOSIS — M25561 Pain in right knee: Secondary | ICD-10-CM | POA: Diagnosis not present

## 2023-01-26 DIAGNOSIS — M533 Sacrococcygeal disorders, not elsewhere classified: Secondary | ICD-10-CM | POA: Diagnosis not present

## 2023-01-26 DIAGNOSIS — E538 Deficiency of other specified B group vitamins: Secondary | ICD-10-CM | POA: Diagnosis not present

## 2023-01-26 DIAGNOSIS — E782 Mixed hyperlipidemia: Secondary | ICD-10-CM | POA: Diagnosis not present

## 2023-01-26 DIAGNOSIS — G894 Chronic pain syndrome: Secondary | ICD-10-CM | POA: Diagnosis not present

## 2023-01-26 DIAGNOSIS — E039 Hypothyroidism, unspecified: Secondary | ICD-10-CM | POA: Diagnosis not present

## 2023-01-26 DIAGNOSIS — M109 Gout, unspecified: Secondary | ICD-10-CM | POA: Diagnosis not present

## 2023-01-26 DIAGNOSIS — E559 Vitamin D deficiency, unspecified: Secondary | ICD-10-CM | POA: Diagnosis not present

## 2023-01-26 DIAGNOSIS — R7303 Prediabetes: Secondary | ICD-10-CM | POA: Diagnosis not present

## 2023-01-28 ENCOUNTER — Encounter: Payer: PPO | Admitting: Orthopedic Surgery

## 2023-02-01 DIAGNOSIS — R7303 Prediabetes: Secondary | ICD-10-CM | POA: Diagnosis not present

## 2023-02-01 DIAGNOSIS — R42 Dizziness and giddiness: Secondary | ICD-10-CM | POA: Diagnosis not present

## 2023-02-01 DIAGNOSIS — G4700 Insomnia, unspecified: Secondary | ICD-10-CM | POA: Diagnosis not present

## 2023-02-01 DIAGNOSIS — E781 Pure hyperglyceridemia: Secondary | ICD-10-CM | POA: Diagnosis not present

## 2023-02-01 DIAGNOSIS — M5442 Lumbago with sciatica, left side: Secondary | ICD-10-CM | POA: Diagnosis not present

## 2023-02-01 DIAGNOSIS — E039 Hypothyroidism, unspecified: Secondary | ICD-10-CM | POA: Diagnosis not present

## 2023-02-01 DIAGNOSIS — I10 Essential (primary) hypertension: Secondary | ICD-10-CM | POA: Diagnosis not present

## 2023-02-01 DIAGNOSIS — K219 Gastro-esophageal reflux disease without esophagitis: Secondary | ICD-10-CM | POA: Diagnosis not present

## 2023-02-01 DIAGNOSIS — E538 Deficiency of other specified B group vitamins: Secondary | ICD-10-CM | POA: Diagnosis not present

## 2023-02-01 DIAGNOSIS — E559 Vitamin D deficiency, unspecified: Secondary | ICD-10-CM | POA: Diagnosis not present

## 2023-02-01 DIAGNOSIS — J309 Allergic rhinitis, unspecified: Secondary | ICD-10-CM | POA: Diagnosis not present

## 2023-02-01 DIAGNOSIS — M109 Gout, unspecified: Secondary | ICD-10-CM | POA: Diagnosis not present

## 2023-02-04 DIAGNOSIS — E538 Deficiency of other specified B group vitamins: Secondary | ICD-10-CM | POA: Diagnosis not present

## 2023-02-09 DIAGNOSIS — M533 Sacrococcygeal disorders, not elsewhere classified: Secondary | ICD-10-CM | POA: Diagnosis not present

## 2023-02-09 DIAGNOSIS — R030 Elevated blood-pressure reading, without diagnosis of hypertension: Secondary | ICD-10-CM | POA: Diagnosis not present

## 2023-02-09 DIAGNOSIS — Z6830 Body mass index (BMI) 30.0-30.9, adult: Secondary | ICD-10-CM | POA: Diagnosis not present

## 2023-02-09 DIAGNOSIS — M109 Gout, unspecified: Secondary | ICD-10-CM | POA: Diagnosis not present

## 2023-02-09 DIAGNOSIS — G894 Chronic pain syndrome: Secondary | ICD-10-CM | POA: Diagnosis not present

## 2023-02-09 DIAGNOSIS — Z79899 Other long term (current) drug therapy: Secondary | ICD-10-CM | POA: Diagnosis not present

## 2023-02-23 DIAGNOSIS — M533 Sacrococcygeal disorders, not elsewhere classified: Secondary | ICD-10-CM | POA: Diagnosis not present

## 2023-02-23 DIAGNOSIS — G894 Chronic pain syndrome: Secondary | ICD-10-CM | POA: Diagnosis not present

## 2023-02-24 DIAGNOSIS — M25512 Pain in left shoulder: Secondary | ICD-10-CM | POA: Diagnosis not present

## 2023-02-25 DIAGNOSIS — M533 Sacrococcygeal disorders, not elsewhere classified: Secondary | ICD-10-CM | POA: Diagnosis not present

## 2023-02-25 DIAGNOSIS — G894 Chronic pain syndrome: Secondary | ICD-10-CM | POA: Diagnosis not present

## 2023-03-16 DIAGNOSIS — Z6830 Body mass index (BMI) 30.0-30.9, adult: Secondary | ICD-10-CM | POA: Diagnosis not present

## 2023-03-16 DIAGNOSIS — M109 Gout, unspecified: Secondary | ICD-10-CM | POA: Diagnosis not present

## 2023-03-16 DIAGNOSIS — G894 Chronic pain syndrome: Secondary | ICD-10-CM | POA: Diagnosis not present

## 2023-03-16 DIAGNOSIS — M533 Sacrococcygeal disorders, not elsewhere classified: Secondary | ICD-10-CM | POA: Diagnosis not present

## 2023-03-16 DIAGNOSIS — R030 Elevated blood-pressure reading, without diagnosis of hypertension: Secondary | ICD-10-CM | POA: Diagnosis not present

## 2023-03-16 DIAGNOSIS — Z79899 Other long term (current) drug therapy: Secondary | ICD-10-CM | POA: Diagnosis not present

## 2023-03-18 DIAGNOSIS — Z79899 Other long term (current) drug therapy: Secondary | ICD-10-CM | POA: Diagnosis not present

## 2023-03-26 DIAGNOSIS — G894 Chronic pain syndrome: Secondary | ICD-10-CM | POA: Diagnosis not present

## 2023-03-26 DIAGNOSIS — M533 Sacrococcygeal disorders, not elsewhere classified: Secondary | ICD-10-CM | POA: Diagnosis not present

## 2023-03-28 DIAGNOSIS — G894 Chronic pain syndrome: Secondary | ICD-10-CM | POA: Diagnosis not present

## 2023-03-28 DIAGNOSIS — M533 Sacrococcygeal disorders, not elsewhere classified: Secondary | ICD-10-CM | POA: Diagnosis not present

## 2023-03-29 DIAGNOSIS — Z713 Dietary counseling and surveillance: Secondary | ICD-10-CM | POA: Diagnosis not present

## 2023-03-29 DIAGNOSIS — Z6830 Body mass index (BMI) 30.0-30.9, adult: Secondary | ICD-10-CM | POA: Diagnosis not present

## 2023-03-29 DIAGNOSIS — J069 Acute upper respiratory infection, unspecified: Secondary | ICD-10-CM | POA: Diagnosis not present

## 2023-03-29 DIAGNOSIS — J309 Allergic rhinitis, unspecified: Secondary | ICD-10-CM | POA: Diagnosis not present

## 2023-04-01 DIAGNOSIS — E538 Deficiency of other specified B group vitamins: Secondary | ICD-10-CM | POA: Diagnosis not present

## 2023-04-14 ENCOUNTER — Other Ambulatory Visit (HOSPITAL_COMMUNITY): Payer: Self-pay | Admitting: Internal Medicine

## 2023-04-14 DIAGNOSIS — Z1231 Encounter for screening mammogram for malignant neoplasm of breast: Secondary | ICD-10-CM

## 2023-04-20 DIAGNOSIS — Z6830 Body mass index (BMI) 30.0-30.9, adult: Secondary | ICD-10-CM | POA: Diagnosis not present

## 2023-04-20 DIAGNOSIS — R030 Elevated blood-pressure reading, without diagnosis of hypertension: Secondary | ICD-10-CM | POA: Diagnosis not present

## 2023-04-20 DIAGNOSIS — E039 Hypothyroidism, unspecified: Secondary | ICD-10-CM | POA: Diagnosis not present

## 2023-04-20 DIAGNOSIS — E6609 Other obesity due to excess calories: Secondary | ICD-10-CM | POA: Diagnosis not present

## 2023-04-20 DIAGNOSIS — M109 Gout, unspecified: Secondary | ICD-10-CM | POA: Diagnosis not present

## 2023-04-20 DIAGNOSIS — F1120 Opioid dependence, uncomplicated: Secondary | ICD-10-CM | POA: Diagnosis not present

## 2023-04-20 DIAGNOSIS — M533 Sacrococcygeal disorders, not elsewhere classified: Secondary | ICD-10-CM | POA: Diagnosis not present

## 2023-04-20 DIAGNOSIS — Z131 Encounter for screening for diabetes mellitus: Secondary | ICD-10-CM | POA: Diagnosis not present

## 2023-04-26 DIAGNOSIS — M533 Sacrococcygeal disorders, not elsewhere classified: Secondary | ICD-10-CM | POA: Diagnosis not present

## 2023-04-26 DIAGNOSIS — G894 Chronic pain syndrome: Secondary | ICD-10-CM | POA: Diagnosis not present

## 2023-04-28 ENCOUNTER — Ambulatory Visit (HOSPITAL_COMMUNITY)
Admission: RE | Admit: 2023-04-28 | Discharge: 2023-04-28 | Disposition: A | Discharge status: Home / Self Care | Payer: PPO | Source: Ambulatory Visit | Attending: Internal Medicine | Admitting: Internal Medicine

## 2023-04-28 DIAGNOSIS — Z1231 Encounter for screening mammogram for malignant neoplasm of breast: Secondary | ICD-10-CM | POA: Diagnosis not present

## 2023-04-28 DIAGNOSIS — G894 Chronic pain syndrome: Secondary | ICD-10-CM | POA: Diagnosis not present

## 2023-04-28 DIAGNOSIS — M533 Sacrococcygeal disorders, not elsewhere classified: Secondary | ICD-10-CM | POA: Diagnosis not present

## 2023-05-03 ENCOUNTER — Other Ambulatory Visit (HOSPITAL_COMMUNITY): Payer: Self-pay | Admitting: Family Medicine

## 2023-05-03 DIAGNOSIS — R928 Other abnormal and inconclusive findings on diagnostic imaging of breast: Secondary | ICD-10-CM

## 2023-05-06 ENCOUNTER — Ambulatory Visit (HOSPITAL_COMMUNITY)
Admission: RE | Admit: 2023-05-06 | Discharge: 2023-05-06 | Disposition: A | Discharge status: Home / Self Care | Payer: PPO | Source: Ambulatory Visit | Attending: Family Medicine | Admitting: Family Medicine

## 2023-05-06 ENCOUNTER — Encounter (HOSPITAL_COMMUNITY): Payer: Self-pay

## 2023-05-06 DIAGNOSIS — R928 Other abnormal and inconclusive findings on diagnostic imaging of breast: Secondary | ICD-10-CM | POA: Insufficient documentation

## 2023-05-06 DIAGNOSIS — R92321 Mammographic fibroglandular density, right breast: Secondary | ICD-10-CM | POA: Diagnosis not present

## 2023-05-11 DIAGNOSIS — E538 Deficiency of other specified B group vitamins: Secondary | ICD-10-CM | POA: Diagnosis not present

## 2023-05-18 DIAGNOSIS — Z0130 Encounter for examination of blood pressure without abnormal findings: Secondary | ICD-10-CM | POA: Diagnosis not present

## 2023-05-18 DIAGNOSIS — Z6831 Body mass index (BMI) 31.0-31.9, adult: Secondary | ICD-10-CM | POA: Diagnosis not present

## 2023-05-18 DIAGNOSIS — Z789 Other specified health status: Secondary | ICD-10-CM | POA: Diagnosis not present

## 2023-05-18 DIAGNOSIS — E6609 Other obesity due to excess calories: Secondary | ICD-10-CM | POA: Diagnosis not present

## 2023-05-18 DIAGNOSIS — R7303 Prediabetes: Secondary | ICD-10-CM | POA: Diagnosis not present

## 2023-05-18 DIAGNOSIS — Z0000 Encounter for general adult medical examination without abnormal findings: Secondary | ICD-10-CM | POA: Diagnosis not present

## 2023-05-18 DIAGNOSIS — E039 Hypothyroidism, unspecified: Secondary | ICD-10-CM | POA: Diagnosis not present

## 2023-05-18 DIAGNOSIS — F1120 Opioid dependence, uncomplicated: Secondary | ICD-10-CM | POA: Diagnosis not present

## 2023-05-20 DIAGNOSIS — Z79899 Other long term (current) drug therapy: Secondary | ICD-10-CM | POA: Diagnosis not present

## 2023-05-26 DIAGNOSIS — M533 Sacrococcygeal disorders, not elsewhere classified: Secondary | ICD-10-CM | POA: Diagnosis not present

## 2023-05-26 DIAGNOSIS — G894 Chronic pain syndrome: Secondary | ICD-10-CM | POA: Diagnosis not present

## 2023-05-28 DIAGNOSIS — G894 Chronic pain syndrome: Secondary | ICD-10-CM | POA: Diagnosis not present

## 2023-05-28 DIAGNOSIS — M533 Sacrococcygeal disorders, not elsewhere classified: Secondary | ICD-10-CM | POA: Diagnosis not present

## 2023-06-10 DIAGNOSIS — E538 Deficiency of other specified B group vitamins: Secondary | ICD-10-CM | POA: Diagnosis not present

## 2023-06-15 DIAGNOSIS — Z6831 Body mass index (BMI) 31.0-31.9, adult: Secondary | ICD-10-CM | POA: Diagnosis not present

## 2023-06-15 DIAGNOSIS — R7303 Prediabetes: Secondary | ICD-10-CM | POA: Diagnosis not present

## 2023-06-15 DIAGNOSIS — E6609 Other obesity due to excess calories: Secondary | ICD-10-CM | POA: Diagnosis not present

## 2023-06-15 DIAGNOSIS — E039 Hypothyroidism, unspecified: Secondary | ICD-10-CM | POA: Diagnosis not present

## 2023-06-15 DIAGNOSIS — M545 Low back pain, unspecified: Secondary | ICD-10-CM | POA: Diagnosis not present

## 2023-06-15 DIAGNOSIS — Z0130 Encounter for examination of blood pressure without abnormal findings: Secondary | ICD-10-CM | POA: Diagnosis not present

## 2023-06-15 DIAGNOSIS — Z789 Other specified health status: Secondary | ICD-10-CM | POA: Diagnosis not present

## 2023-06-15 DIAGNOSIS — F1120 Opioid dependence, uncomplicated: Secondary | ICD-10-CM | POA: Diagnosis not present

## 2023-06-16 DIAGNOSIS — E538 Deficiency of other specified B group vitamins: Secondary | ICD-10-CM | POA: Diagnosis not present

## 2023-06-16 DIAGNOSIS — E782 Mixed hyperlipidemia: Secondary | ICD-10-CM | POA: Diagnosis not present

## 2023-06-16 DIAGNOSIS — E559 Vitamin D deficiency, unspecified: Secondary | ICD-10-CM | POA: Diagnosis not present

## 2023-06-16 DIAGNOSIS — E039 Hypothyroidism, unspecified: Secondary | ICD-10-CM | POA: Diagnosis not present

## 2023-06-16 DIAGNOSIS — M109 Gout, unspecified: Secondary | ICD-10-CM | POA: Diagnosis not present

## 2023-06-16 DIAGNOSIS — R7303 Prediabetes: Secondary | ICD-10-CM | POA: Diagnosis not present

## 2023-06-26 DIAGNOSIS — G894 Chronic pain syndrome: Secondary | ICD-10-CM | POA: Diagnosis not present

## 2023-06-26 DIAGNOSIS — M533 Sacrococcygeal disorders, not elsewhere classified: Secondary | ICD-10-CM | POA: Diagnosis not present

## 2023-06-28 DIAGNOSIS — M533 Sacrococcygeal disorders, not elsewhere classified: Secondary | ICD-10-CM | POA: Diagnosis not present

## 2023-06-28 DIAGNOSIS — G894 Chronic pain syndrome: Secondary | ICD-10-CM | POA: Diagnosis not present

## 2023-06-30 ENCOUNTER — Encounter: Payer: Self-pay | Admitting: Obstetrics & Gynecology

## 2023-06-30 ENCOUNTER — Other Ambulatory Visit (HOSPITAL_COMMUNITY)
Admission: RE | Admit: 2023-06-30 | Discharge: 2023-06-30 | Disposition: A | Discharge status: Home / Self Care | Payer: PPO | Source: Ambulatory Visit | Attending: Obstetrics & Gynecology | Admitting: Obstetrics & Gynecology

## 2023-06-30 ENCOUNTER — Ambulatory Visit (INDEPENDENT_AMBULATORY_CARE_PROVIDER_SITE_OTHER): Payer: PPO | Admitting: Obstetrics & Gynecology

## 2023-06-30 VITALS — BP 140/87 | HR 75 | Ht 64.5 in | Wt 178.0 lb

## 2023-06-30 DIAGNOSIS — Z1211 Encounter for screening for malignant neoplasm of colon: Secondary | ICD-10-CM

## 2023-06-30 DIAGNOSIS — Z01419 Encounter for gynecological examination (general) (routine) without abnormal findings: Secondary | ICD-10-CM | POA: Insufficient documentation

## 2023-06-30 NOTE — Addendum Note (Signed)
Addended by: Caralyn Guile on: 06/30/2023 04:51 PM   Modules accepted: Orders

## 2023-06-30 NOTE — Progress Notes (Signed)
Subjective:     Jill Roach is a 72 y.o. female here for a routine exam.  No LMP recorded. Patient is postmenopausal. [id number] Birth Control Method:  menopausal Menstrual Calendar(currently): amenorrhiec  Current complaints: low back pain radiating to buttock.   Current acute medical issues:  back pain   Recent Gynecologic History No LMP recorded. Patient is postmenopausal. Last Pap: unsure,   Last mammogram: 04/2023,  normal  Past Medical History:  Diagnosis Date   Allergy    Cataract    GERD (gastroesophageal reflux disease)    Hyperlipidemia    Hypertension    PONV (postoperative nausea and vomiting)    Thyroid disease     Past Surgical History:  Procedure Laterality Date   ambulatory esophageal 24 hour pH study  2004   within normal range. Dr. Luretha Murphy   BREAST BIOPSY Right 02/21/2021   FIBROCYSTIC AND FIBROADENOMATOID CHANGE WITH CALCIFICATIONS, SCLEROSING ADENOSIS   COLONOSCOPY N/A 11/04/2016   diverticulosis, otherwise normal. Repeat in 2028.    ESOPHAGEAL MANOMETRY  2004   Dr. Luretha Murphy: normal   ESOPHAGOGASTRODUODENOSCOPY  2003   Dr. Luretha Murphy: normal    ESOPHAGOGASTRODUODENOSCOPY N/A 11/04/2016   LA grade B esophagitis s/p dilation, normal stomach and duodenum   EYE SURGERY     cataracts   FRACTURE SURGERY     cataract surgery    LIGAMENT REPAIR     rt knee   MALONEY DILATION N/A 11/04/2016   Procedure: Jill Roach DILATION;  Surgeon: Corbin Ade, MD;  Location: [facility];  Service: Endoscopy;  Laterality: N/A;   TUBAL LIGATION      OB History     Gravida  3   Para  3   Term  3   Preterm      AB      Living  3      SAB      IAB      Ectopic      Multiple      Live Births  3           Social History   Socioeconomic History   Marital status: Divorced    Spouse name: Not on file   Number of children: 3   Years of education: Not on file   Highest education level: Not on file  Occupational History    Not on file  Tobacco Use   Smoking status: Never   Smokeless tobacco: Never  Vaping Use   Vaping status: Never Used  Substance and Sexual Activity   Alcohol use: No   Drug use: No   Sexual activity: Not Currently    Birth control/protection: Post-menopausal  Other Topics Concern   Not on file  Social History Narrative   Not on file   Social Determinants of Health   Financial Resource Strain: Low Risk  (06/30/2023)   Overall Financial Resource Strain (CARDIA)    Difficulty of Paying Living Expenses: Not very hard  Food Insecurity: No Food Insecurity (06/30/2023)   Hunger Vital Sign    Worried About Running Out of Food in the Last Year: Never true    Ran Out of Food in the Last Year: Never true  Transportation Needs: No Transportation Needs (06/30/2023)   PRAPARE - Administrator, Civil Service (Medical): No    Lack of Transportation (Non-Medical): No  Physical Activity: Insufficiently Active (06/30/2023)   Exercise Vital Sign    Days of Exercise per Week: 2 days  Minutes of Exercise per Session: 60 min  Stress: No Stress Concern Present (06/30/2023)   [employer] of Occupational Health - Occupational Stress Questionnaire    Feeling of Stress : Not at all  Social Connections: Moderately Isolated (06/30/2023)   Social Connection and Isolation Panel [NHANES]    Frequency of Communication with Friends and Family: More than three times a week    Frequency of Social Gatherings with Friends and Family: Twice a week    Attends Religious Services: 1 to 4 times per year    Active Member of [employer] or Organizations: No    Attends Engineer, structural: Never    Marital Status: Divorced    Family History  Problem Relation Age of Onset   Hypertension Mother    Arthritis Father        Rheumatoid    Diabetes Father    Hypertension Father    Thyroid disease Father    Arthritis Brother        Rheumatoid   Thyroid disease Brother    Heart disease  Maternal Grandmother        stomach cancer   Hypertension Maternal Grandmother    Cancer Paternal Grandmother        LEUKEMIA/LUNG CNACER,   Thyroid disease Paternal Grandmother    Hypertension Paternal Grandmother    Cancer Other    Mental illness Other    Cancer Maternal Uncle    Thyroid disease Paternal Aunt    Colon cancer Neg Hx      Current Outpatient Medications:    diclofenac Sodium (VOLTAREN) 1 % GEL, Apply 4 g topically 4 (four) times daily., Disp: 50 g, Rfl: 0   fluocinonide-emollient (LIDEX-E) 0.05 % cream, Apply 1 Application topically 2 (two) times daily., Disp: 30 g, Rfl: 11   fluticasone (FLONASE) 50 MCG/ACT nasal spray, Place 2 sprays into both nostrils daily., Disp: 16 g, Rfl: 6   levocetirizine (XYZAL) 5 MG tablet, Take 1 tablet (5 mg total) by mouth every evening., Disp: 90 tablet, Rfl: 0   levothyroxine (SYNTHROID) 88 MCG tablet, Take 1 tablet by mouth once daily, Disp: 30 tablet, Rfl: 0   lisinopril-hydrochlorothiazide (ZESTORETIC) 20-12.5 MG tablet, Take 1 tablet by mouth daily., Disp: 90 tablet, Rfl: 0   montelukast (SINGULAIR) 10 MG tablet, Take 1 tablet (10 mg total) by mouth at bedtime., Disp: 90 tablet, Rfl: 0   pantoprazole (PROTONIX) 40 MG tablet, Take 1 tablet (40 mg total) by mouth daily before breakfast., Disp: 90 tablet, Rfl: 3   traMADol (ULTRAM) 50 MG tablet, Take 50 mg by mouth 3 (three) times daily as needed., Disp: , Rfl:    traZODone (DESYREL) 50 MG tablet, TAKE 1/2 TO 1  TABLET BY MOUTH AT BEDTIME AS NEEDED FOR SLEEP, Disp: 45 tablet, Rfl: 0   triamcinolone ointment (KENALOG) 0.5 %, Apply 1 Application topically 2 (two) times daily., Disp: 30 g, Rfl: 11   fluconazole (DIFLUCAN) 150 MG tablet, Take 1 tablet (150 mg total) by mouth daily. (Patient not taking: Reported on 12/30/2021), Disp: 1 tablet, Rfl: 0   gabapentin (NEURONTIN) 100 MG capsule, Take 1 capsule (100 mg total) by mouth 3 (three) times daily. (Patient not taking: Reported on 05/04/2022),  Disp: 90 capsule, Rfl: 0   meclizine (ANTIVERT) 25 MG tablet, Take 25 mg by mouth 3 (three) times daily as needed for dizziness. (Patient not taking: Reported on 05/04/2022), Disp: , Rfl:    nabumetone (RELAFEN) 500 MG tablet, Take 500 mg by mouth  daily. (Patient not taking: Reported on 05/04/2022), Disp: , Rfl:   Review of Systems  Review of Systems  Constitutional: Negative for fever, chills, weight loss, malaise/fatigue and diaphoresis.  HENT: Negative for hearing loss, ear pain, nosebleeds, congestion, sore throat, neck pain, tinnitus and ear discharge.   Eyes: Negative for blurred vision, double vision, photophobia, pain, discharge and redness.  Respiratory: Negative for cough, hemoptysis, sputum production, shortness of breath, wheezing and stridor.   Cardiovascular: Negative for chest pain, palpitations, orthopnea, claudication, leg swelling and PND.  Gastrointestinal: negative for abdominal pain. Negative for heartburn, nausea, vomiting, diarrhea, constipation, blood in stool and melena.  Genitourinary: Negative for dysuria, urgency, frequency, hematuria and flank pain.  Musculoskeletal: Negative for myalgias, back pain, joint pain and falls.  Skin: Negative for itching and rash.  Neurological: Negative for dizziness, tingling, tremors, sensory change, speech change, focal weakness, seizures, loss of consciousness, weakness and headaches.  Endo/Heme/Allergies: Negative for environmental allergies and polydipsia. Does not bruise/bleed easily.  Psychiatric/Behavioral: Negative for depression, suicidal ideas, hallucinations, memory loss and substance abuse. The patient is not nervous/anxious and does not have insomnia.        Objective:  Blood pressure (!) 140/87, pulse 75, height 5' 4.5" (1.638 m), weight 178 lb (80.7 kg).   Physical Exam  Vitals reviewed. Constitutional: She is oriented to person, place, and time. She appears well-developed and well-nourished.  HENT:  Head:  Normocephalic and atraumatic.        Right Ear: External ear normal.  Left Ear: External ear normal.  Nose: Nose normal.  Mouth/Throat: Oropharynx is clear and moist.  Eyes: Conjunctivae and EOM are normal. Pupils are equal, round, and reactive to light. Right eye exhibits no discharge. Left eye exhibits no discharge. No scleral icterus.  Neck: Normal range of motion. Neck supple. No tracheal deviation present. No thyromegaly present.  Cardiovascular: Normal rate, regular rhythm, normal heart sounds and intact distal pulses.  Exam reveals no gallop and no friction rub.   No murmur heard. Respiratory: Effort normal and breath sounds normal. No respiratory distress. She has no wheezes. She has no rales. She exhibits no tenderness.  GI: Soft. Bowel sounds are normal. She exhibits no distension and no mass. There is no tenderness. There is no rebound and no guarding.  Genitourinary:  Breasts no masses skin changes or nipple changes bilaterally      Vulva is normal without lesions Vagina is pink moist without discharge Cervix normal in appearance and pap is done Uterus is normal size shape and contour Adnexa is negative with normal sized ovaries  {Rectal    hemoccult negative, normal tone, no masses  Musculoskeletal: Normal range of motion. She exhibits no edema and no tenderness.  Neurological: She is alert and oriented to person, place, and time. She has normal reflexes. She displays normal reflexes. No cranial nerve deficit. She exhibits normal muscle tone. Coordination normal.  Skin: Skin is warm and dry. No rash noted. No erythema. No pallor.  Psychiatric: She has a normal mood and affect. Her behavior is normal. Judgment and thought content normal.       Medications Ordered at today's visit: No orders of the defined types were placed in this encounter.   Other orders placed at today's visit: No orders of the defined types were placed in this encounter.    ASSESSMENT + PLAN:     ICD-10-CM   1. Well woman exam with routine gynecological exam  Z01.419     2. Colon cancer  screening  Z12.11           Return in about 1 year (around 06/29/2024).

## 2023-07-05 LAB — CYTOLOGY - PAP
Comment: NEGATIVE
Comment: NEGATIVE
Comment: NEGATIVE
HPV 16: NEGATIVE
HPV 18 / 45: NEGATIVE
High risk HPV: POSITIVE — AB

## 2023-07-06 DIAGNOSIS — B3731 Acute candidiasis of vulva and vagina: Secondary | ICD-10-CM | POA: Diagnosis not present

## 2023-07-06 DIAGNOSIS — J069 Acute upper respiratory infection, unspecified: Secondary | ICD-10-CM | POA: Diagnosis not present

## 2023-07-06 DIAGNOSIS — J0100 Acute maxillary sinusitis, unspecified: Secondary | ICD-10-CM | POA: Diagnosis not present

## 2023-07-12 DIAGNOSIS — K219 Gastro-esophageal reflux disease without esophagitis: Secondary | ICD-10-CM | POA: Diagnosis not present

## 2023-07-12 DIAGNOSIS — G4700 Insomnia, unspecified: Secondary | ICD-10-CM | POA: Diagnosis not present

## 2023-07-12 DIAGNOSIS — R42 Dizziness and giddiness: Secondary | ICD-10-CM | POA: Diagnosis not present

## 2023-07-12 DIAGNOSIS — E538 Deficiency of other specified B group vitamins: Secondary | ICD-10-CM | POA: Diagnosis not present

## 2023-07-12 DIAGNOSIS — R7303 Prediabetes: Secondary | ICD-10-CM | POA: Diagnosis not present

## 2023-07-12 DIAGNOSIS — I10 Essential (primary) hypertension: Secondary | ICD-10-CM | POA: Diagnosis not present

## 2023-07-12 DIAGNOSIS — K5903 Drug induced constipation: Secondary | ICD-10-CM | POA: Diagnosis not present

## 2023-07-12 DIAGNOSIS — M5442 Lumbago with sciatica, left side: Secondary | ICD-10-CM | POA: Diagnosis not present

## 2023-07-12 DIAGNOSIS — E781 Pure hyperglyceridemia: Secondary | ICD-10-CM | POA: Diagnosis not present

## 2023-07-12 DIAGNOSIS — E039 Hypothyroidism, unspecified: Secondary | ICD-10-CM | POA: Diagnosis not present

## 2023-07-12 DIAGNOSIS — E559 Vitamin D deficiency, unspecified: Secondary | ICD-10-CM | POA: Diagnosis not present

## 2023-07-12 DIAGNOSIS — M109 Gout, unspecified: Secondary | ICD-10-CM | POA: Diagnosis not present

## 2023-07-26 DIAGNOSIS — M533 Sacrococcygeal disorders, not elsewhere classified: Secondary | ICD-10-CM | POA: Diagnosis not present

## 2023-07-26 DIAGNOSIS — G894 Chronic pain syndrome: Secondary | ICD-10-CM | POA: Diagnosis not present

## 2023-07-28 DIAGNOSIS — G894 Chronic pain syndrome: Secondary | ICD-10-CM | POA: Diagnosis not present

## 2023-07-28 DIAGNOSIS — M533 Sacrococcygeal disorders, not elsewhere classified: Secondary | ICD-10-CM | POA: Diagnosis not present

## 2023-08-17 ENCOUNTER — Other Ambulatory Visit: Payer: Self-pay | Admitting: Gastroenterology

## 2023-08-17 DIAGNOSIS — M545 Low back pain, unspecified: Secondary | ICD-10-CM | POA: Diagnosis not present

## 2023-08-17 DIAGNOSIS — R7303 Prediabetes: Secondary | ICD-10-CM | POA: Diagnosis not present

## 2023-08-17 DIAGNOSIS — Z0130 Encounter for examination of blood pressure without abnormal findings: Secondary | ICD-10-CM | POA: Diagnosis not present

## 2023-08-17 DIAGNOSIS — E6609 Other obesity due to excess calories: Secondary | ICD-10-CM | POA: Diagnosis not present

## 2023-08-17 DIAGNOSIS — Z6830 Body mass index (BMI) 30.0-30.9, adult: Secondary | ICD-10-CM | POA: Diagnosis not present

## 2023-08-17 DIAGNOSIS — F1120 Opioid dependence, uncomplicated: Secondary | ICD-10-CM | POA: Diagnosis not present

## 2023-08-17 DIAGNOSIS — E559 Vitamin D deficiency, unspecified: Secondary | ICD-10-CM | POA: Diagnosis not present

## 2023-08-17 DIAGNOSIS — Z1159 Encounter for screening for other viral diseases: Secondary | ICD-10-CM | POA: Diagnosis not present

## 2023-08-17 DIAGNOSIS — E039 Hypothyroidism, unspecified: Secondary | ICD-10-CM | POA: Diagnosis not present

## 2023-08-17 DIAGNOSIS — R09A2 Foreign body sensation, throat: Secondary | ICD-10-CM

## 2023-08-17 DIAGNOSIS — K219 Gastro-esophageal reflux disease without esophagitis: Secondary | ICD-10-CM

## 2023-08-17 DIAGNOSIS — Z760 Encounter for issue of repeat prescription: Secondary | ICD-10-CM | POA: Diagnosis not present

## 2023-08-17 DIAGNOSIS — Z789 Other specified health status: Secondary | ICD-10-CM | POA: Diagnosis not present

## 2023-08-26 DIAGNOSIS — G894 Chronic pain syndrome: Secondary | ICD-10-CM | POA: Diagnosis not present

## 2023-08-26 DIAGNOSIS — M533 Sacrococcygeal disorders, not elsewhere classified: Secondary | ICD-10-CM | POA: Diagnosis not present

## 2023-08-28 DIAGNOSIS — M533 Sacrococcygeal disorders, not elsewhere classified: Secondary | ICD-10-CM | POA: Diagnosis not present

## 2023-08-28 DIAGNOSIS — G894 Chronic pain syndrome: Secondary | ICD-10-CM | POA: Diagnosis not present

## 2023-09-08 DIAGNOSIS — R7303 Prediabetes: Secondary | ICD-10-CM | POA: Diagnosis not present

## 2023-09-08 DIAGNOSIS — E559 Vitamin D deficiency, unspecified: Secondary | ICD-10-CM | POA: Diagnosis not present

## 2023-09-08 DIAGNOSIS — E039 Hypothyroidism, unspecified: Secondary | ICD-10-CM | POA: Diagnosis not present

## 2023-09-08 DIAGNOSIS — F1120 Opioid dependence, uncomplicated: Secondary | ICD-10-CM | POA: Diagnosis not present

## 2023-09-08 DIAGNOSIS — Z1159 Encounter for screening for other viral diseases: Secondary | ICD-10-CM | POA: Diagnosis not present

## 2023-09-14 DIAGNOSIS — Z789 Other specified health status: Secondary | ICD-10-CM | POA: Diagnosis not present

## 2023-09-14 DIAGNOSIS — F1120 Opioid dependence, uncomplicated: Secondary | ICD-10-CM | POA: Diagnosis not present

## 2023-09-14 DIAGNOSIS — R7303 Prediabetes: Secondary | ICD-10-CM | POA: Diagnosis not present

## 2023-09-14 DIAGNOSIS — E6609 Other obesity due to excess calories: Secondary | ICD-10-CM | POA: Diagnosis not present

## 2023-09-14 DIAGNOSIS — Z0130 Encounter for examination of blood pressure without abnormal findings: Secondary | ICD-10-CM | POA: Diagnosis not present

## 2023-09-14 DIAGNOSIS — E790 Hyperuricemia without signs of inflammatory arthritis and tophaceous disease: Secondary | ICD-10-CM | POA: Diagnosis not present

## 2023-09-14 DIAGNOSIS — E559 Vitamin D deficiency, unspecified: Secondary | ICD-10-CM | POA: Diagnosis not present

## 2023-09-14 DIAGNOSIS — E039 Hypothyroidism, unspecified: Secondary | ICD-10-CM | POA: Diagnosis not present

## 2023-09-14 DIAGNOSIS — Z6830 Body mass index (BMI) 30.0-30.9, adult: Secondary | ICD-10-CM | POA: Diagnosis not present

## 2023-09-14 DIAGNOSIS — R030 Elevated blood-pressure reading, without diagnosis of hypertension: Secondary | ICD-10-CM | POA: Diagnosis not present

## 2023-09-14 DIAGNOSIS — M549 Dorsalgia, unspecified: Secondary | ICD-10-CM | POA: Diagnosis not present

## 2023-09-14 DIAGNOSIS — G8929 Other chronic pain: Secondary | ICD-10-CM | POA: Diagnosis not present

## 2023-09-16 DIAGNOSIS — Z79899 Other long term (current) drug therapy: Secondary | ICD-10-CM | POA: Diagnosis not present

## 2023-09-26 DIAGNOSIS — G894 Chronic pain syndrome: Secondary | ICD-10-CM | POA: Diagnosis not present

## 2023-09-26 DIAGNOSIS — M533 Sacrococcygeal disorders, not elsewhere classified: Secondary | ICD-10-CM | POA: Diagnosis not present

## 2023-09-28 DIAGNOSIS — M533 Sacrococcygeal disorders, not elsewhere classified: Secondary | ICD-10-CM | POA: Diagnosis not present

## 2023-09-28 DIAGNOSIS — G894 Chronic pain syndrome: Secondary | ICD-10-CM | POA: Diagnosis not present

## 2023-10-07 ENCOUNTER — Other Ambulatory Visit: Payer: Self-pay | Admitting: Obstetrics & Gynecology

## 2023-10-12 DIAGNOSIS — M792 Neuralgia and neuritis, unspecified: Secondary | ICD-10-CM | POA: Diagnosis not present

## 2023-10-12 DIAGNOSIS — R7303 Prediabetes: Secondary | ICD-10-CM | POA: Diagnosis not present

## 2023-10-12 DIAGNOSIS — Z789 Other specified health status: Secondary | ICD-10-CM | POA: Diagnosis not present

## 2023-10-12 DIAGNOSIS — E559 Vitamin D deficiency, unspecified: Secondary | ICD-10-CM | POA: Diagnosis not present

## 2023-10-12 DIAGNOSIS — F1120 Opioid dependence, uncomplicated: Secondary | ICD-10-CM | POA: Diagnosis not present

## 2023-10-12 DIAGNOSIS — Z760 Encounter for issue of repeat prescription: Secondary | ICD-10-CM | POA: Diagnosis not present

## 2023-10-12 DIAGNOSIS — Z1159 Encounter for screening for other viral diseases: Secondary | ICD-10-CM | POA: Diagnosis not present

## 2023-10-12 DIAGNOSIS — E6609 Other obesity due to excess calories: Secondary | ICD-10-CM | POA: Diagnosis not present

## 2023-10-12 DIAGNOSIS — Z6830 Body mass index (BMI) 30.0-30.9, adult: Secondary | ICD-10-CM | POA: Diagnosis not present

## 2023-10-12 DIAGNOSIS — M545 Low back pain, unspecified: Secondary | ICD-10-CM | POA: Diagnosis not present

## 2023-10-12 DIAGNOSIS — E039 Hypothyroidism, unspecified: Secondary | ICD-10-CM | POA: Diagnosis not present

## 2023-10-21 DIAGNOSIS — B3731 Acute candidiasis of vulva and vagina: Secondary | ICD-10-CM | POA: Diagnosis not present

## 2023-10-21 DIAGNOSIS — J019 Acute sinusitis, unspecified: Secondary | ICD-10-CM | POA: Diagnosis not present

## 2023-10-24 DIAGNOSIS — G894 Chronic pain syndrome: Secondary | ICD-10-CM | POA: Diagnosis not present

## 2023-10-24 DIAGNOSIS — M533 Sacrococcygeal disorders, not elsewhere classified: Secondary | ICD-10-CM | POA: Diagnosis not present

## 2023-10-26 DIAGNOSIS — G894 Chronic pain syndrome: Secondary | ICD-10-CM | POA: Diagnosis not present

## 2023-10-26 DIAGNOSIS — M533 Sacrococcygeal disorders, not elsewhere classified: Secondary | ICD-10-CM | POA: Diagnosis not present

## 2023-11-04 ENCOUNTER — Other Ambulatory Visit (HOSPITAL_COMMUNITY): Payer: Self-pay | Admitting: Family Medicine

## 2023-11-04 DIAGNOSIS — N6489 Other specified disorders of breast: Secondary | ICD-10-CM

## 2023-11-10 DIAGNOSIS — F1120 Opioid dependence, uncomplicated: Secondary | ICD-10-CM | POA: Diagnosis not present

## 2023-11-10 DIAGNOSIS — M792 Neuralgia and neuritis, unspecified: Secondary | ICD-10-CM | POA: Diagnosis not present

## 2023-11-10 DIAGNOSIS — L97519 Non-pressure chronic ulcer of other part of right foot with unspecified severity: Secondary | ICD-10-CM | POA: Diagnosis not present

## 2023-11-10 DIAGNOSIS — R030 Elevated blood-pressure reading, without diagnosis of hypertension: Secondary | ICD-10-CM | POA: Diagnosis not present

## 2023-11-10 DIAGNOSIS — R7303 Prediabetes: Secondary | ICD-10-CM | POA: Diagnosis not present

## 2023-11-10 DIAGNOSIS — I10 Essential (primary) hypertension: Secondary | ICD-10-CM | POA: Diagnosis not present

## 2023-11-10 DIAGNOSIS — Z789 Other specified health status: Secondary | ICD-10-CM | POA: Diagnosis not present

## 2023-11-10 DIAGNOSIS — E559 Vitamin D deficiency, unspecified: Secondary | ICD-10-CM | POA: Diagnosis not present

## 2023-11-10 DIAGNOSIS — E6609 Other obesity due to excess calories: Secondary | ICD-10-CM | POA: Diagnosis not present

## 2023-11-10 DIAGNOSIS — M545 Low back pain, unspecified: Secondary | ICD-10-CM | POA: Diagnosis not present

## 2023-11-10 DIAGNOSIS — E039 Hypothyroidism, unspecified: Secondary | ICD-10-CM | POA: Diagnosis not present

## 2023-11-10 DIAGNOSIS — Z6830 Body mass index (BMI) 30.0-30.9, adult: Secondary | ICD-10-CM | POA: Diagnosis not present

## 2023-11-11 DIAGNOSIS — Z79899 Other long term (current) drug therapy: Secondary | ICD-10-CM | POA: Diagnosis not present

## 2023-11-24 DIAGNOSIS — M533 Sacrococcygeal disorders, not elsewhere classified: Secondary | ICD-10-CM | POA: Diagnosis not present

## 2023-11-24 DIAGNOSIS — G894 Chronic pain syndrome: Secondary | ICD-10-CM | POA: Diagnosis not present

## 2023-12-03 DIAGNOSIS — B379 Candidiasis, unspecified: Secondary | ICD-10-CM | POA: Diagnosis not present

## 2023-12-03 DIAGNOSIS — J019 Acute sinusitis, unspecified: Secondary | ICD-10-CM | POA: Diagnosis not present

## 2023-12-13 DIAGNOSIS — L97519 Non-pressure chronic ulcer of other part of right foot with unspecified severity: Secondary | ICD-10-CM | POA: Diagnosis not present

## 2023-12-13 DIAGNOSIS — E6609 Other obesity due to excess calories: Secondary | ICD-10-CM | POA: Diagnosis not present

## 2023-12-13 DIAGNOSIS — E559 Vitamin D deficiency, unspecified: Secondary | ICD-10-CM | POA: Diagnosis not present

## 2023-12-13 DIAGNOSIS — R7303 Prediabetes: Secondary | ICD-10-CM | POA: Diagnosis not present

## 2023-12-13 DIAGNOSIS — E039 Hypothyroidism, unspecified: Secondary | ICD-10-CM | POA: Diagnosis not present

## 2023-12-13 DIAGNOSIS — Z789 Other specified health status: Secondary | ICD-10-CM | POA: Diagnosis not present

## 2023-12-13 DIAGNOSIS — M545 Low back pain, unspecified: Secondary | ICD-10-CM | POA: Diagnosis not present

## 2023-12-13 DIAGNOSIS — M792 Neuralgia and neuritis, unspecified: Secondary | ICD-10-CM | POA: Diagnosis not present

## 2023-12-13 DIAGNOSIS — I10 Essential (primary) hypertension: Secondary | ICD-10-CM | POA: Diagnosis not present

## 2023-12-13 DIAGNOSIS — Z1211 Encounter for screening for malignant neoplasm of colon: Secondary | ICD-10-CM | POA: Diagnosis not present

## 2023-12-13 DIAGNOSIS — Z6830 Body mass index (BMI) 30.0-30.9, adult: Secondary | ICD-10-CM | POA: Diagnosis not present

## 2023-12-13 DIAGNOSIS — F1120 Opioid dependence, uncomplicated: Secondary | ICD-10-CM | POA: Diagnosis not present

## 2023-12-14 ENCOUNTER — Ambulatory Visit (HOSPITAL_COMMUNITY)
Admission: RE | Admit: 2023-12-14 | Discharge: 2023-12-14 | Disposition: A | Discharge status: Home / Self Care | Source: Ambulatory Visit | Attending: Family Medicine | Admitting: Family Medicine

## 2023-12-14 ENCOUNTER — Encounter (HOSPITAL_COMMUNITY): Payer: Self-pay

## 2023-12-14 DIAGNOSIS — R928 Other abnormal and inconclusive findings on diagnostic imaging of breast: Secondary | ICD-10-CM | POA: Diagnosis not present

## 2023-12-14 DIAGNOSIS — N6489 Other specified disorders of breast: Secondary | ICD-10-CM

## 2023-12-14 DIAGNOSIS — R92333 Mammographic heterogeneous density, bilateral breasts: Secondary | ICD-10-CM | POA: Diagnosis not present

## 2023-12-24 DIAGNOSIS — G894 Chronic pain syndrome: Secondary | ICD-10-CM | POA: Diagnosis not present

## 2023-12-24 DIAGNOSIS — M533 Sacrococcygeal disorders, not elsewhere classified: Secondary | ICD-10-CM | POA: Diagnosis not present

## 2024-01-05 DIAGNOSIS — E039 Hypothyroidism, unspecified: Secondary | ICD-10-CM | POA: Diagnosis not present

## 2024-01-05 DIAGNOSIS — R7303 Prediabetes: Secondary | ICD-10-CM | POA: Diagnosis not present

## 2024-01-05 DIAGNOSIS — M109 Gout, unspecified: Secondary | ICD-10-CM | POA: Diagnosis not present

## 2024-01-05 DIAGNOSIS — E782 Mixed hyperlipidemia: Secondary | ICD-10-CM | POA: Diagnosis not present

## 2024-01-05 DIAGNOSIS — E559 Vitamin D deficiency, unspecified: Secondary | ICD-10-CM | POA: Diagnosis not present

## 2024-01-05 DIAGNOSIS — E538 Deficiency of other specified B group vitamins: Secondary | ICD-10-CM | POA: Diagnosis not present

## 2024-01-11 DIAGNOSIS — R7303 Prediabetes: Secondary | ICD-10-CM | POA: Diagnosis not present

## 2024-01-11 DIAGNOSIS — K5903 Drug induced constipation: Secondary | ICD-10-CM | POA: Diagnosis not present

## 2024-01-11 DIAGNOSIS — E538 Deficiency of other specified B group vitamins: Secondary | ICD-10-CM | POA: Diagnosis not present

## 2024-01-11 DIAGNOSIS — R42 Dizziness and giddiness: Secondary | ICD-10-CM | POA: Diagnosis not present

## 2024-01-11 DIAGNOSIS — I10 Essential (primary) hypertension: Secondary | ICD-10-CM | POA: Diagnosis not present

## 2024-01-11 DIAGNOSIS — E039 Hypothyroidism, unspecified: Secondary | ICD-10-CM | POA: Diagnosis not present

## 2024-01-11 DIAGNOSIS — E782 Mixed hyperlipidemia: Secondary | ICD-10-CM | POA: Diagnosis not present

## 2024-01-11 DIAGNOSIS — K219 Gastro-esophageal reflux disease without esophagitis: Secondary | ICD-10-CM | POA: Diagnosis not present

## 2024-01-11 DIAGNOSIS — M109 Gout, unspecified: Secondary | ICD-10-CM | POA: Diagnosis not present

## 2024-01-11 DIAGNOSIS — E559 Vitamin D deficiency, unspecified: Secondary | ICD-10-CM | POA: Diagnosis not present

## 2024-01-11 DIAGNOSIS — G4700 Insomnia, unspecified: Secondary | ICD-10-CM | POA: Diagnosis not present

## 2024-01-11 DIAGNOSIS — M5442 Lumbago with sciatica, left side: Secondary | ICD-10-CM | POA: Diagnosis not present

## 2024-01-12 DIAGNOSIS — Z6831 Body mass index (BMI) 31.0-31.9, adult: Secondary | ICD-10-CM | POA: Diagnosis not present

## 2024-01-12 DIAGNOSIS — M792 Neuralgia and neuritis, unspecified: Secondary | ICD-10-CM | POA: Diagnosis not present

## 2024-01-12 DIAGNOSIS — E559 Vitamin D deficiency, unspecified: Secondary | ICD-10-CM | POA: Diagnosis not present

## 2024-01-12 DIAGNOSIS — F1120 Opioid dependence, uncomplicated: Secondary | ICD-10-CM | POA: Diagnosis not present

## 2024-01-12 DIAGNOSIS — I10 Essential (primary) hypertension: Secondary | ICD-10-CM | POA: Diagnosis not present

## 2024-01-12 DIAGNOSIS — M545 Low back pain, unspecified: Secondary | ICD-10-CM | POA: Diagnosis not present

## 2024-01-12 DIAGNOSIS — Z789 Other specified health status: Secondary | ICD-10-CM | POA: Diagnosis not present

## 2024-01-12 DIAGNOSIS — R7303 Prediabetes: Secondary | ICD-10-CM | POA: Diagnosis not present

## 2024-01-12 DIAGNOSIS — E039 Hypothyroidism, unspecified: Secondary | ICD-10-CM | POA: Diagnosis not present

## 2024-01-12 DIAGNOSIS — E6609 Other obesity due to excess calories: Secondary | ICD-10-CM | POA: Diagnosis not present

## 2024-01-12 DIAGNOSIS — M533 Sacrococcygeal disorders, not elsewhere classified: Secondary | ICD-10-CM | POA: Diagnosis not present

## 2024-01-19 DIAGNOSIS — E538 Deficiency of other specified B group vitamins: Secondary | ICD-10-CM | POA: Diagnosis not present

## 2024-01-19 DIAGNOSIS — N3281 Overactive bladder: Secondary | ICD-10-CM | POA: Diagnosis not present

## 2024-01-19 DIAGNOSIS — E162 Hypoglycemia, unspecified: Secondary | ICD-10-CM | POA: Diagnosis not present

## 2024-01-19 DIAGNOSIS — I10 Essential (primary) hypertension: Secondary | ICD-10-CM | POA: Diagnosis not present

## 2024-01-31 DIAGNOSIS — M545 Low back pain, unspecified: Secondary | ICD-10-CM | POA: Diagnosis not present

## 2024-02-15 DIAGNOSIS — R7303 Prediabetes: Secondary | ICD-10-CM | POA: Diagnosis not present

## 2024-02-15 DIAGNOSIS — M533 Sacrococcygeal disorders, not elsewhere classified: Secondary | ICD-10-CM | POA: Diagnosis not present

## 2024-02-15 DIAGNOSIS — E039 Hypothyroidism, unspecified: Secondary | ICD-10-CM | POA: Diagnosis not present

## 2024-02-15 DIAGNOSIS — Z6831 Body mass index (BMI) 31.0-31.9, adult: Secondary | ICD-10-CM | POA: Diagnosis not present

## 2024-02-15 DIAGNOSIS — Z789 Other specified health status: Secondary | ICD-10-CM | POA: Diagnosis not present

## 2024-02-15 DIAGNOSIS — M545 Low back pain, unspecified: Secondary | ICD-10-CM | POA: Diagnosis not present

## 2024-02-15 DIAGNOSIS — E6609 Other obesity due to excess calories: Secondary | ICD-10-CM | POA: Diagnosis not present

## 2024-02-15 DIAGNOSIS — I10 Essential (primary) hypertension: Secondary | ICD-10-CM | POA: Diagnosis not present

## 2024-02-15 DIAGNOSIS — M792 Neuralgia and neuritis, unspecified: Secondary | ICD-10-CM | POA: Diagnosis not present

## 2024-02-15 DIAGNOSIS — F1120 Opioid dependence, uncomplicated: Secondary | ICD-10-CM | POA: Diagnosis not present

## 2024-03-06 DIAGNOSIS — M533 Sacrococcygeal disorders, not elsewhere classified: Secondary | ICD-10-CM | POA: Diagnosis not present

## 2024-03-15 DIAGNOSIS — E6609 Other obesity due to excess calories: Secondary | ICD-10-CM | POA: Diagnosis not present

## 2024-03-15 DIAGNOSIS — E039 Hypothyroidism, unspecified: Secondary | ICD-10-CM | POA: Diagnosis not present

## 2024-03-15 DIAGNOSIS — Z789 Other specified health status: Secondary | ICD-10-CM | POA: Diagnosis not present

## 2024-03-15 DIAGNOSIS — I10 Essential (primary) hypertension: Secondary | ICD-10-CM | POA: Diagnosis not present

## 2024-03-15 DIAGNOSIS — R7303 Prediabetes: Secondary | ICD-10-CM | POA: Diagnosis not present

## 2024-03-15 DIAGNOSIS — M545 Low back pain, unspecified: Secondary | ICD-10-CM | POA: Diagnosis not present

## 2024-03-15 DIAGNOSIS — F1120 Opioid dependence, uncomplicated: Secondary | ICD-10-CM | POA: Diagnosis not present

## 2024-03-15 DIAGNOSIS — Z6831 Body mass index (BMI) 31.0-31.9, adult: Secondary | ICD-10-CM | POA: Diagnosis not present

## 2024-03-15 DIAGNOSIS — M533 Sacrococcygeal disorders, not elsewhere classified: Secondary | ICD-10-CM | POA: Diagnosis not present

## 2024-03-15 DIAGNOSIS — M792 Neuralgia and neuritis, unspecified: Secondary | ICD-10-CM | POA: Diagnosis not present

## 2024-03-16 DIAGNOSIS — Z79899 Other long term (current) drug therapy: Secondary | ICD-10-CM | POA: Diagnosis not present

## 2024-03-20 DIAGNOSIS — M533 Sacrococcygeal disorders, not elsewhere classified: Secondary | ICD-10-CM | POA: Diagnosis not present

## 2024-03-20 DIAGNOSIS — M545 Low back pain, unspecified: Secondary | ICD-10-CM | POA: Diagnosis not present

## 2024-04-17 DIAGNOSIS — Z789 Other specified health status: Secondary | ICD-10-CM | POA: Diagnosis not present

## 2024-04-17 DIAGNOSIS — I10 Essential (primary) hypertension: Secondary | ICD-10-CM | POA: Diagnosis not present

## 2024-04-17 DIAGNOSIS — E6609 Other obesity due to excess calories: Secondary | ICD-10-CM | POA: Diagnosis not present

## 2024-04-17 DIAGNOSIS — E039 Hypothyroidism, unspecified: Secondary | ICD-10-CM | POA: Diagnosis not present

## 2024-04-17 DIAGNOSIS — M792 Neuralgia and neuritis, unspecified: Secondary | ICD-10-CM | POA: Diagnosis not present

## 2024-04-17 DIAGNOSIS — F1120 Opioid dependence, uncomplicated: Secondary | ICD-10-CM | POA: Diagnosis not present

## 2024-04-17 DIAGNOSIS — Z6831 Body mass index (BMI) 31.0-31.9, adult: Secondary | ICD-10-CM | POA: Diagnosis not present

## 2024-04-17 DIAGNOSIS — M533 Sacrococcygeal disorders, not elsewhere classified: Secondary | ICD-10-CM | POA: Diagnosis not present

## 2024-04-17 DIAGNOSIS — M545 Low back pain, unspecified: Secondary | ICD-10-CM | POA: Diagnosis not present

## 2024-04-17 DIAGNOSIS — R7303 Prediabetes: Secondary | ICD-10-CM | POA: Diagnosis not present

## 2024-04-26 DIAGNOSIS — M5416 Radiculopathy, lumbar region: Secondary | ICD-10-CM | POA: Diagnosis not present

## 2024-05-15 ENCOUNTER — Encounter (HOSPITAL_COMMUNITY): Payer: Self-pay | Admitting: Family Medicine

## 2024-05-16 ENCOUNTER — Other Ambulatory Visit (HOSPITAL_COMMUNITY): Payer: Self-pay | Admitting: Family Medicine

## 2024-05-16 DIAGNOSIS — N6489 Other specified disorders of breast: Secondary | ICD-10-CM

## 2024-05-17 DIAGNOSIS — R7303 Prediabetes: Secondary | ICD-10-CM | POA: Diagnosis not present

## 2024-05-17 DIAGNOSIS — F1120 Opioid dependence, uncomplicated: Secondary | ICD-10-CM | POA: Diagnosis not present

## 2024-05-17 DIAGNOSIS — M792 Neuralgia and neuritis, unspecified: Secondary | ICD-10-CM | POA: Diagnosis not present

## 2024-05-17 DIAGNOSIS — Z6831 Body mass index (BMI) 31.0-31.9, adult: Secondary | ICD-10-CM | POA: Diagnosis not present

## 2024-05-17 DIAGNOSIS — M545 Low back pain, unspecified: Secondary | ICD-10-CM | POA: Diagnosis not present

## 2024-05-17 DIAGNOSIS — Z789 Other specified health status: Secondary | ICD-10-CM | POA: Diagnosis not present

## 2024-05-17 DIAGNOSIS — E039 Hypothyroidism, unspecified: Secondary | ICD-10-CM | POA: Diagnosis not present

## 2024-05-17 DIAGNOSIS — I10 Essential (primary) hypertension: Secondary | ICD-10-CM | POA: Diagnosis not present

## 2024-05-17 DIAGNOSIS — E6609 Other obesity due to excess calories: Secondary | ICD-10-CM | POA: Diagnosis not present

## 2024-05-17 DIAGNOSIS — M533 Sacrococcygeal disorders, not elsewhere classified: Secondary | ICD-10-CM | POA: Diagnosis not present

## 2024-05-22 DIAGNOSIS — Z79899 Other long term (current) drug therapy: Secondary | ICD-10-CM | POA: Diagnosis not present

## 2024-05-23 ENCOUNTER — Encounter (HOSPITAL_COMMUNITY)

## 2024-05-23 ENCOUNTER — Ambulatory Visit (HOSPITAL_COMMUNITY)
Admission: RE | Admit: 2024-05-23 | Discharge: 2024-05-23 | Disposition: A | Discharge status: Home / Self Care | Source: Ambulatory Visit | Attending: Family Medicine | Admitting: Family Medicine

## 2024-05-23 ENCOUNTER — Ambulatory Visit (HOSPITAL_COMMUNITY)
Admission: RE | Admit: 2024-05-23 | Discharge: 2024-05-23 | Disposition: A | Source: Ambulatory Visit | Attending: Family Medicine | Admitting: Family Medicine

## 2024-05-23 ENCOUNTER — Other Ambulatory Visit (HOSPITAL_COMMUNITY)

## 2024-05-23 ENCOUNTER — Encounter (HOSPITAL_COMMUNITY): Payer: Self-pay

## 2024-05-23 DIAGNOSIS — R92333 Mammographic heterogeneous density, bilateral breasts: Secondary | ICD-10-CM | POA: Diagnosis not present

## 2024-05-23 DIAGNOSIS — N6312 Unspecified lump in the right breast, upper inner quadrant: Secondary | ICD-10-CM | POA: Diagnosis not present

## 2024-05-23 DIAGNOSIS — N6489 Other specified disorders of breast: Secondary | ICD-10-CM

## 2024-05-31 DIAGNOSIS — E538 Deficiency of other specified B group vitamins: Secondary | ICD-10-CM | POA: Diagnosis not present

## 2024-05-31 DIAGNOSIS — J0110 Acute frontal sinusitis, unspecified: Secondary | ICD-10-CM | POA: Diagnosis not present

## 2024-06-06 DIAGNOSIS — E538 Deficiency of other specified B group vitamins: Secondary | ICD-10-CM | POA: Diagnosis not present

## 2024-06-06 DIAGNOSIS — E039 Hypothyroidism, unspecified: Secondary | ICD-10-CM | POA: Diagnosis not present

## 2024-06-06 DIAGNOSIS — R7303 Prediabetes: Secondary | ICD-10-CM | POA: Diagnosis not present

## 2024-06-06 DIAGNOSIS — M109 Gout, unspecified: Secondary | ICD-10-CM | POA: Diagnosis not present

## 2024-06-06 DIAGNOSIS — E559 Vitamin D deficiency, unspecified: Secondary | ICD-10-CM | POA: Diagnosis not present

## 2024-06-06 DIAGNOSIS — E782 Mixed hyperlipidemia: Secondary | ICD-10-CM | POA: Diagnosis not present

## 2024-06-13 DIAGNOSIS — E782 Mixed hyperlipidemia: Secondary | ICD-10-CM | POA: Diagnosis not present

## 2024-06-13 DIAGNOSIS — G4700 Insomnia, unspecified: Secondary | ICD-10-CM | POA: Diagnosis not present

## 2024-06-13 DIAGNOSIS — Z0000 Encounter for general adult medical examination without abnormal findings: Secondary | ICD-10-CM | POA: Diagnosis not present

## 2024-06-13 DIAGNOSIS — Z0001 Encounter for general adult medical examination with abnormal findings: Secondary | ICD-10-CM | POA: Diagnosis not present

## 2024-06-13 DIAGNOSIS — K219 Gastro-esophageal reflux disease without esophagitis: Secondary | ICD-10-CM | POA: Diagnosis not present

## 2024-06-13 DIAGNOSIS — E538 Deficiency of other specified B group vitamins: Secondary | ICD-10-CM | POA: Diagnosis not present

## 2024-06-13 DIAGNOSIS — R7303 Prediabetes: Secondary | ICD-10-CM | POA: Diagnosis not present

## 2024-06-13 DIAGNOSIS — E559 Vitamin D deficiency, unspecified: Secondary | ICD-10-CM | POA: Diagnosis not present

## 2024-06-13 DIAGNOSIS — E039 Hypothyroidism, unspecified: Secondary | ICD-10-CM | POA: Diagnosis not present

## 2024-06-13 DIAGNOSIS — I10 Essential (primary) hypertension: Secondary | ICD-10-CM | POA: Diagnosis not present

## 2024-06-27 DIAGNOSIS — L603 Nail dystrophy: Secondary | ICD-10-CM | POA: Diagnosis not present

## 2024-07-10 ENCOUNTER — Ambulatory Visit: Admitting: Obstetrics & Gynecology

## 2024-07-12 DIAGNOSIS — M533 Sacrococcygeal disorders, not elsewhere classified: Secondary | ICD-10-CM | POA: Diagnosis not present

## 2024-07-12 DIAGNOSIS — E039 Hypothyroidism, unspecified: Secondary | ICD-10-CM | POA: Diagnosis not present

## 2024-07-12 DIAGNOSIS — E6609 Other obesity due to excess calories: Secondary | ICD-10-CM | POA: Diagnosis not present

## 2024-07-12 DIAGNOSIS — Z6831 Body mass index (BMI) 31.0-31.9, adult: Secondary | ICD-10-CM | POA: Diagnosis not present

## 2024-07-12 DIAGNOSIS — R7303 Prediabetes: Secondary | ICD-10-CM | POA: Diagnosis not present

## 2024-07-12 DIAGNOSIS — I10 Essential (primary) hypertension: Secondary | ICD-10-CM | POA: Diagnosis not present

## 2024-07-12 DIAGNOSIS — M545 Low back pain, unspecified: Secondary | ICD-10-CM | POA: Diagnosis not present

## 2024-07-12 DIAGNOSIS — M25551 Pain in right hip: Secondary | ICD-10-CM | POA: Diagnosis not present

## 2024-07-12 DIAGNOSIS — Z789 Other specified health status: Secondary | ICD-10-CM | POA: Diagnosis not present

## 2024-07-12 DIAGNOSIS — M792 Neuralgia and neuritis, unspecified: Secondary | ICD-10-CM | POA: Diagnosis not present

## 2024-07-12 DIAGNOSIS — F1120 Opioid dependence, uncomplicated: Secondary | ICD-10-CM | POA: Diagnosis not present

## 2024-07-12 DIAGNOSIS — M25552 Pain in left hip: Secondary | ICD-10-CM | POA: Diagnosis not present

## 2024-07-17 ENCOUNTER — Ambulatory Visit: Admitting: Obstetrics & Gynecology

## 2024-08-28 ENCOUNTER — Ambulatory Visit: Admitting: Obstetrics & Gynecology

## 2024-08-28 ENCOUNTER — Encounter: Payer: Self-pay | Admitting: Obstetrics & Gynecology

## 2024-08-28 VITALS — BP 148/74 | HR 86 | Ht 64.5 in | Wt 182.8 lb

## 2024-08-28 DIAGNOSIS — Z01419 Encounter for gynecological examination (general) (routine) without abnormal findings: Secondary | ICD-10-CM

## 2024-08-28 DIAGNOSIS — L900 Lichen sclerosus et atrophicus: Secondary | ICD-10-CM

## 2024-08-28 MED ORDER — FLUOCINONIDE EMULSIFIED BASE 0.05 % EX CREA: 1.0000 | TOPICAL_CREAM | Freq: Every evening | CUTANEOUS | 11 refills | Status: AC

## 2024-08-28 NOTE — Progress Notes (Signed)
 Subjective:     Jill Roach is a 74 y.o. female here for a routine exam.  No LMP recorded. Patient is postmenopausal. [id number] Birth Control Method:  menoapusal Menstrual Calendar(currently): amenorrhea  Current complaints: occasional LSA symtpoms.   Current acute medical issues:  none   Recent Gynecologic History No LMP recorded. Patient is postmenopausal. Last Pap: 11/24,  negative HPV Last mammogram: 05/2024,  normal  Past Medical History:  Diagnosis Date   Allergy    Cataract    GERD (gastroesophageal reflux disease)    Hyperlipidemia    Hypertension    PONV (postoperative nausea and vomiting)    Thyroid  disease     Past Surgical History:  Procedure Laterality Date   ambulatory esophageal 24 hour pH study  2004   within normal range. Dr. Donnice Lunger   BREAST BIOPSY Right 02/21/2021   FIBROCYSTIC AND FIBROADENOMATOID CHANGE WITH CALCIFICATIONS, SCLEROSING ADENOSIS   COLONOSCOPY N/A 11/04/2016   diverticulosis, otherwise normal. Repeat in 2028.    ESOPHAGEAL MANOMETRY  2004   Dr. Donnice Lunger: normal   ESOPHAGOGASTRODUODENOSCOPY  2003   Dr. Donnice Lunger: normal    ESOPHAGOGASTRODUODENOSCOPY N/A 11/04/2016   LA grade B esophagitis s/p dilation, normal stomach and duodenum   EYE SURGERY     cataracts   FRACTURE SURGERY     cataract surgery    LIGAMENT REPAIR     rt knee   MALONEY DILATION N/A 11/04/2016   Procedure: AGAPITO DILATION;  Surgeon: Lamar CHRISTELLA Hollingshead, MD;  Location: [facility];  Service: Endoscopy;  Laterality: N/A;   TUBAL LIGATION      OB History     Gravida  3   Para  3   Term  3   Preterm      AB      Living  3      SAB      IAB      Ectopic      Multiple      Live Births  3           Social History   Socioeconomic History   Marital status: Divorced    Spouse name: Not on file   Number of children: 3   Years of education: Not on file   Highest education level: Not on file  Occupational History   Not on  file  Tobacco Use   Smoking status: Never   Smokeless tobacco: Never  Vaping Use   Vaping status: Never Used  Substance and Sexual Activity   Alcohol  use: No   Drug use: No   Sexual activity: Not Currently    Birth control/protection: Post-menopausal  Other Topics Concern   Not on file  Social History Narrative   Not on file   Social Drivers of Health   Tobacco Use: Low Risk (08/28/2024)   Patient History    Smoking Tobacco Use: Never    Smokeless Tobacco Use: Never    Passive Exposure: Not on file  Financial Resource Strain: Low Risk (08/28/2024)   Overall Financial Resource Strain (CARDIA)    Difficulty of Paying Living Expenses: Not very hard  Food Insecurity: No Food Insecurity (08/28/2024)   Epic    Worried About Programme Researcher, Broadcasting/film/video in the Last Year: Never true    Ran Out of Food in the Last Year: Never true  Transportation Needs: No Transportation Needs (08/28/2024)   Epic    Lack of Transportation (Medical): No    Lack of  Transportation (Non-Medical): No  Physical Activity: Insufficiently Active (08/28/2024)   Exercise Vital Sign    Days of Exercise per Week: 1 day    Minutes of Exercise per Session: 30 min  Stress: No Stress Concern Present (08/28/2024)   [employer] of Occupational Health - Occupational Stress Questionnaire    Feeling of Stress: Not at all  Social Connections: Moderately Isolated (08/28/2024)   Social Connection and Isolation Panel    Frequency of Communication with Friends and Family: More than three times a week    Frequency of Social Gatherings with Friends and Family: Twice a week    Attends Religious Services: 1 to 4 times per year    Active Member of [employer] or Organizations: No    Attends Banker Meetings: Never    Marital Status: Divorced  Depression (PHQ2-9): Low Risk (08/28/2024)   Depression (PHQ2-9)    PHQ-2 Score: 1  Alcohol  Screen: Low Risk (08/28/2024)   Alcohol  Screen    Last Alcohol  Screening Score (AUDIT): 0   Housing: Unknown (08/28/2024)   Epic    Unable to Pay for Housing in the Last Year: No    Number of Times Moved in the Last Year: Not on file    Homeless in the Last Year: No  Utilities: Not At Risk (08/28/2024)   Epic    Threatened with loss of utilities: No  Health Literacy: Adequate Health Literacy (08/28/2024)   B1300 Health Literacy    Frequency of need for help with medical instructions: Never    Family History  Problem Relation Age of Onset   Hypertension Mother    Arthritis Father        Rheumatoid    Diabetes Father    Hypertension Father    Thyroid  disease Father    Arthritis Brother        Rheumatoid   Thyroid  disease Brother    Heart disease Maternal Grandmother        stomach cancer   Hypertension Maternal Grandmother    Cancer Paternal Grandmother        LEUKEMIA/LUNG CNACER,   Thyroid  disease Paternal Grandmother    Hypertension Paternal Grandmother    Cancer Other    Mental illness Other    Cancer Maternal Uncle    Thyroid  disease Paternal Aunt    Colon cancer Neg Hx     Current Medications[1]  Review of Systems  Review of Systems  Constitutional: Negative for fever, chills, weight loss, malaise/fatigue and diaphoresis.  HENT: Negative for hearing loss, ear pain, nosebleeds, congestion, sore throat, neck pain, tinnitus and ear discharge.   Eyes: Negative for blurred vision, double vision, photophobia, pain, discharge and redness.  Respiratory: Negative for cough, hemoptysis, sputum production, shortness of breath, wheezing and stridor.   Cardiovascular: Negative for chest pain, palpitations, orthopnea, claudication, leg swelling and PND.  Gastrointestinal: negative for abdominal pain. Negative for heartburn, nausea, vomiting, diarrhea, constipation, blood in stool and melena.  Genitourinary: Negative for dysuria, urgency, frequency, hematuria and flank pain.  Musculoskeletal: Negative for myalgias, back pain, joint pain and falls.  Skin: Negative for  itching and rash.  Neurological: Negative for dizziness, tingling, tremors, sensory change, speech change, focal weakness, seizures, loss of consciousness, weakness and headaches.  Endo/Heme/Allergies: Negative for environmental allergies and polydipsia. Does not bruise/bleed easily.  Psychiatric/Behavioral: Negative for depression, suicidal ideas, hallucinations, memory loss and substance abuse. The patient is not nervous/anxious and does not have insomnia.        Objective:  Blood pressure (!) 148/74, pulse 86, height 5' 4.5 (1.638 m), weight 182 lb 12.8 oz (82.9 kg).   Physical Exam  Vitals reviewed. Constitutional: She is oriented to person, place, and time. She appears well-developed and well-nourished.  HENT:  Head: Normocephalic and atraumatic.        Right Ear: External ear normal.  Left Ear: External ear normal.  Nose: Nose normal.  Mouth/Throat: Oropharynx is clear and moist.  Eyes: Conjunctivae and EOM are normal. Pupils are equal, round, and reactive to light. Right eye exhibits no discharge. Left eye exhibits no discharge. No scleral icterus.  Neck: Normal range of motion. Neck supple. No tracheal deviation present. No thyromegaly present.  Cardiovascular: Normal rate, regular rhythm, normal heart sounds and intact distal pulses.  Exam reveals no gallop and no friction rub.   No murmur heard. Respiratory: Effort normal and breath sounds normal. No respiratory distress. She has no wheezes. She has no rales. She exhibits no tenderness.  GI: Soft. Bowel sounds are normal. She exhibits no distension and no mass. There is no tenderness. There is no rebound and no guarding.  Genitourinary:  Breasts no masses skin changes or nipple changes bilaterally      Vulva is normal without lesions Vagina is pink moist without discharge Cervix normal in appearance  Uterus is normal size shape and contour Adnexa is negative with normal sized ovaries   Musculoskeletal: Normal range of  motion. She exhibits no edema and no tenderness.  Neurological: She is alert and oriented to person, place, and time. She has normal reflexes. She displays normal reflexes. No cranial nerve deficit. She exhibits normal muscle tone. Coordination normal.  Skin: Skin is warm and dry. No rash noted. No erythema. No pallor.  Psychiatric: She has a normal mood and affect. Her behavior is normal. Judgment and thought content normal.       Medications Ordered at today's visit: No orders of the defined types were placed in this encounter.   Other orders placed at today's visit: No orders of the defined types were placed in this encounter.    ASSESSMENT + PLAN:    ICD-10-CM   1. Well woman exam with routine gynecological exam  Z01.419     2. Lichen sclerosus et atrophicus: Lidex  E cream twice a week, if flares every night then twice a week again  L90.0           Return in about 3 years (around 08/29/2027), or if symptoms worsen or fail to improve.     [1]  Current Outpatient Medications:    allopurinol (ZYLOPRIM) 100 MG tablet, Take 100 mg by mouth daily., Disp: , Rfl:    diclofenac  Sodium (VOLTAREN ) 1 % GEL, Apply 4 g topically 4 (four) times daily., Disp: 50 g, Rfl: 0   fluocinonide -emollient (LIDEX -E) 0.05 % cream, APPLY CREAM TOPICALLY TWICE DAILY, Disp: 30 g, Rfl: 11   fluticasone  (FLONASE ) 50 MCG/ACT nasal spray, Place 2 sprays into both nostrils daily., Disp: 16 g, Rfl: 6   levocetirizine (XYZAL ) 5 MG tablet, Take 1 tablet (5 mg total) by mouth every evening., Disp: 90 tablet, Rfl: 0   levothyroxine  (SYNTHROID ) 88 MCG tablet, Take 1 tablet by mouth once daily, Disp: 30 tablet, Rfl: 0   lisinopril -hydrochlorothiazide  (ZESTORETIC ) 20-12.5 MG tablet, Take 1 tablet by mouth daily., Disp: 90 tablet, Rfl: 0   losartan (COZAAR) 25 MG tablet, Take 25 mg by mouth daily., Disp: , Rfl:    meclizine (ANTIVERT) 25 MG tablet, Take  25 mg by mouth 3 (three) times daily as needed for dizziness.,  Disp: , Rfl:    montelukast  (SINGULAIR ) 10 MG tablet, Take 1 tablet (10 mg total) by mouth at bedtime., Disp: 90 tablet, Rfl: 0   pantoprazole  (PROTONIX ) 40 MG tablet, Take 1 tablet (40 mg total) by mouth daily before breakfast., Disp: 90 tablet, Rfl: 3   traMADol  (ULTRAM ) 50 MG tablet, Take 50 mg by mouth 3 (three) times daily as needed., Disp: , Rfl:    traZODone  (DESYREL ) 50 MG tablet, TAKE 1/2 TO 1  TABLET BY MOUTH AT BEDTIME AS NEEDED FOR SLEEP, Disp: 45 tablet, Rfl: 0   triamcinolone  ointment (KENALOG ) 0.5 %, APPLY  OINTMENT TOPICALLY TWICE DAILY, Disp: 30 g, Rfl: 11   Vitamin D, Ergocalciferol, (DRISDOL) 1.25 MG (50000 UNIT) CAPS capsule, Take 50,000 Units by mouth once a week., Disp: , Rfl:    fluconazole  (DIFLUCAN ) 150 MG tablet, Take 1 tablet (150 mg total) by mouth daily. (Patient not taking: Reported on 08/28/2024), Disp: 1 tablet, Rfl: 0   gabapentin  (NEURONTIN ) 100 MG capsule, Take 1 capsule (100 mg total) by mouth 3 (three) times daily. (Patient not taking: Reported on 08/28/2024), Disp: 90 capsule, Rfl: 0   nabumetone (RELAFEN) 500 MG tablet, Take 500 mg by mouth daily. (Patient not taking: Reported on 08/28/2024), Disp: , Rfl:
